# Patient Record
Sex: Male | Born: 1945 | Race: Black or African American | Hispanic: No | Smoking: Current every day smoker
Health system: Southern US, Community
[De-identification: ages and names within clinical notes are randomized; demographics above are authoritative.]

## PROBLEM LIST (undated history)

## (undated) DIAGNOSIS — I10 Essential (primary) hypertension: Secondary | ICD-10-CM

## (undated) DIAGNOSIS — F09 Unspecified mental disorder due to known physiological condition: Secondary | ICD-10-CM

## (undated) DIAGNOSIS — Z9889 Other specified postprocedural states: Secondary | ICD-10-CM

## (undated) DIAGNOSIS — S065X9A Traumatic subdural hemorrhage with loss of consciousness of unspecified duration, initial encounter: Secondary | ICD-10-CM

## (undated) DIAGNOSIS — G8191 Hemiplegia, unspecified affecting right dominant side: Secondary | ICD-10-CM

## (undated) DIAGNOSIS — R269 Unspecified abnormalities of gait and mobility: Secondary | ICD-10-CM

## (undated) DIAGNOSIS — J189 Pneumonia, unspecified organism: Secondary | ICD-10-CM

## (undated) DIAGNOSIS — I639 Cerebral infarction, unspecified: Secondary | ICD-10-CM

## (undated) DIAGNOSIS — T420X1A Poisoning by hydantoin derivatives, accidental (unintentional), initial encounter: Secondary | ICD-10-CM

## (undated) DIAGNOSIS — S065XAA Traumatic subdural hemorrhage with loss of consciousness status unknown, initial encounter: Secondary | ICD-10-CM

## (undated) DIAGNOSIS — F1910 Other psychoactive substance abuse, uncomplicated: Secondary | ICD-10-CM

## (undated) DIAGNOSIS — R569 Unspecified convulsions: Secondary | ICD-10-CM

## (undated) HISTORY — PX: CRANIOTOMY: SHX93

## (undated) HISTORY — DX: Unspecified abnormalities of gait and mobility: R26.9

---

## 1997-11-05 ENCOUNTER — Other Ambulatory Visit: Admission: RE | Admit: 1997-11-05 | Discharge: 1997-11-05 | Payer: Self-pay

## 1997-12-24 ENCOUNTER — Other Ambulatory Visit: Admission: RE | Admit: 1997-12-24 | Discharge: 1997-12-24 | Payer: Self-pay

## 1998-01-19 ENCOUNTER — Other Ambulatory Visit: Admission: RE | Admit: 1998-01-19 | Discharge: 1998-01-19 | Payer: Self-pay

## 1998-02-14 ENCOUNTER — Other Ambulatory Visit: Admission: RE | Admit: 1998-02-14 | Discharge: 1998-02-14 | Payer: Self-pay

## 1998-02-18 ENCOUNTER — Inpatient Hospital Stay (HOSPITAL_COMMUNITY): Admission: EM | Admit: 1998-02-18 | Discharge: 1998-02-21 | Payer: Self-pay | Admitting: Emergency Medicine

## 1998-06-27 ENCOUNTER — Encounter: Payer: Self-pay | Admitting: Emergency Medicine

## 1998-06-28 ENCOUNTER — Encounter: Payer: Self-pay | Admitting: Internal Medicine

## 1998-06-28 ENCOUNTER — Inpatient Hospital Stay (HOSPITAL_COMMUNITY): Admission: EM | Admit: 1998-06-28 | Discharge: 1998-06-29 | Payer: Self-pay | Admitting: Emergency Medicine

## 1998-08-26 ENCOUNTER — Encounter: Payer: Self-pay | Admitting: Emergency Medicine

## 1998-08-27 ENCOUNTER — Inpatient Hospital Stay (HOSPITAL_COMMUNITY): Admission: EM | Admit: 1998-08-27 | Discharge: 1998-09-01 | Payer: Self-pay | Admitting: Emergency Medicine

## 1998-08-28 ENCOUNTER — Encounter (HOSPITAL_BASED_OUTPATIENT_CLINIC_OR_DEPARTMENT_OTHER): Payer: Self-pay | Admitting: Internal Medicine

## 1998-10-02 ENCOUNTER — Encounter: Payer: Self-pay | Admitting: Emergency Medicine

## 1998-10-02 ENCOUNTER — Emergency Department (HOSPITAL_COMMUNITY): Admission: EM | Admit: 1998-10-02 | Discharge: 1998-10-02 | Payer: Self-pay | Admitting: Emergency Medicine

## 1998-10-28 ENCOUNTER — Encounter: Payer: Self-pay | Admitting: Internal Medicine

## 1998-10-28 ENCOUNTER — Inpatient Hospital Stay (HOSPITAL_COMMUNITY): Admission: EM | Admit: 1998-10-28 | Discharge: 1998-10-31 | Payer: Self-pay | Admitting: Emergency Medicine

## 1998-10-28 ENCOUNTER — Encounter: Payer: Self-pay | Admitting: Emergency Medicine

## 1998-12-06 ENCOUNTER — Encounter: Payer: Self-pay | Admitting: Emergency Medicine

## 1998-12-06 ENCOUNTER — Inpatient Hospital Stay (HOSPITAL_COMMUNITY): Admission: EM | Admit: 1998-12-06 | Discharge: 1998-12-09 | Payer: Self-pay | Admitting: Emergency Medicine

## 1999-03-09 ENCOUNTER — Emergency Department (HOSPITAL_COMMUNITY): Admission: EM | Admit: 1999-03-09 | Discharge: 1999-03-09 | Payer: Self-pay | Admitting: Emergency Medicine

## 1999-03-10 ENCOUNTER — Encounter: Payer: Self-pay | Admitting: Emergency Medicine

## 2000-01-20 ENCOUNTER — Emergency Department (HOSPITAL_COMMUNITY): Admission: EM | Admit: 2000-01-20 | Discharge: 2000-01-20 | Payer: Self-pay | Admitting: Emergency Medicine

## 2000-01-20 ENCOUNTER — Encounter: Payer: Self-pay | Admitting: Emergency Medicine

## 2000-01-29 ENCOUNTER — Encounter: Payer: Self-pay | Admitting: Emergency Medicine

## 2000-01-29 ENCOUNTER — Inpatient Hospital Stay (HOSPITAL_COMMUNITY): Admission: EM | Admit: 2000-01-29 | Discharge: 2000-02-08 | Payer: Self-pay | Admitting: Emergency Medicine

## 2000-01-30 ENCOUNTER — Encounter: Payer: Self-pay | Admitting: Family Medicine

## 2000-02-01 ENCOUNTER — Encounter: Payer: Self-pay | Admitting: Family Medicine

## 2000-02-03 ENCOUNTER — Encounter: Payer: Self-pay | Admitting: Family Medicine

## 2000-02-04 ENCOUNTER — Encounter: Payer: Self-pay | Admitting: Family Medicine

## 2000-02-09 ENCOUNTER — Emergency Department (HOSPITAL_COMMUNITY): Admission: EM | Admit: 2000-02-09 | Discharge: 2000-02-09 | Payer: Self-pay | Admitting: Emergency Medicine

## 2000-02-22 ENCOUNTER — Encounter: Admission: RE | Admit: 2000-02-22 | Discharge: 2000-02-22 | Payer: Self-pay | Admitting: Family Medicine

## 2000-05-26 ENCOUNTER — Emergency Department (HOSPITAL_COMMUNITY): Admission: EM | Admit: 2000-05-26 | Discharge: 2000-05-27 | Payer: Self-pay | Admitting: Emergency Medicine

## 2000-06-05 ENCOUNTER — Emergency Department (HOSPITAL_COMMUNITY): Admission: EM | Admit: 2000-06-05 | Discharge: 2000-06-05 | Payer: Self-pay | Admitting: Emergency Medicine

## 2000-06-24 ENCOUNTER — Emergency Department (HOSPITAL_COMMUNITY): Admission: EM | Admit: 2000-06-24 | Discharge: 2000-06-24 | Payer: Self-pay | Admitting: Emergency Medicine

## 2000-08-17 ENCOUNTER — Inpatient Hospital Stay (HOSPITAL_COMMUNITY): Admission: EM | Admit: 2000-08-17 | Discharge: 2000-08-19 | Payer: Self-pay | Admitting: *Deleted

## 2000-11-26 ENCOUNTER — Emergency Department (HOSPITAL_COMMUNITY): Admission: EM | Admit: 2000-11-26 | Discharge: 2000-11-26 | Payer: Self-pay | Admitting: Emergency Medicine

## 2000-12-22 ENCOUNTER — Emergency Department (HOSPITAL_COMMUNITY): Admission: EM | Admit: 2000-12-22 | Discharge: 2000-12-22 | Payer: Self-pay | Admitting: Emergency Medicine

## 2000-12-22 ENCOUNTER — Encounter: Payer: Self-pay | Admitting: Emergency Medicine

## 2001-03-12 ENCOUNTER — Emergency Department (HOSPITAL_COMMUNITY): Admission: EM | Admit: 2001-03-12 | Discharge: 2001-03-13 | Payer: Self-pay

## 2001-06-02 ENCOUNTER — Inpatient Hospital Stay (HOSPITAL_COMMUNITY): Admission: EM | Admit: 2001-06-02 | Discharge: 2001-06-04 | Payer: Self-pay | Admitting: Emergency Medicine

## 2001-06-02 ENCOUNTER — Encounter: Payer: Self-pay | Admitting: Emergency Medicine

## 2001-11-06 ENCOUNTER — Emergency Department (HOSPITAL_COMMUNITY): Admission: EM | Admit: 2001-11-06 | Discharge: 2001-11-06 | Payer: Self-pay | Admitting: Emergency Medicine

## 2003-02-08 ENCOUNTER — Emergency Department (HOSPITAL_COMMUNITY): Admission: EM | Admit: 2003-02-08 | Discharge: 2003-02-09 | Payer: Self-pay | Admitting: *Deleted

## 2003-09-21 ENCOUNTER — Emergency Department (HOSPITAL_COMMUNITY): Admission: EM | Admit: 2003-09-21 | Discharge: 2003-09-21 | Payer: Self-pay | Admitting: Emergency Medicine

## 2003-12-21 ENCOUNTER — Emergency Department (HOSPITAL_COMMUNITY): Admission: EM | Admit: 2003-12-21 | Discharge: 2003-12-21 | Payer: Self-pay

## 2004-02-02 ENCOUNTER — Emergency Department (HOSPITAL_COMMUNITY): Admission: EM | Admit: 2004-02-02 | Discharge: 2004-02-03 | Payer: Self-pay

## 2004-02-19 ENCOUNTER — Emergency Department (HOSPITAL_COMMUNITY): Admission: EM | Admit: 2004-02-19 | Discharge: 2004-02-19 | Payer: Self-pay

## 2004-04-24 ENCOUNTER — Emergency Department (HOSPITAL_COMMUNITY): Admission: EM | Admit: 2004-04-24 | Discharge: 2004-04-24 | Payer: Self-pay | Admitting: Emergency Medicine

## 2004-05-20 ENCOUNTER — Emergency Department (HOSPITAL_COMMUNITY): Admission: EM | Admit: 2004-05-20 | Discharge: 2004-05-21 | Payer: Self-pay | Admitting: Emergency Medicine

## 2004-06-20 ENCOUNTER — Emergency Department (HOSPITAL_COMMUNITY): Admission: EM | Admit: 2004-06-20 | Discharge: 2004-06-20 | Payer: Self-pay | Admitting: Emergency Medicine

## 2004-07-25 ENCOUNTER — Emergency Department (HOSPITAL_COMMUNITY): Admission: EM | Admit: 2004-07-25 | Discharge: 2004-07-25 | Payer: Self-pay | Admitting: Emergency Medicine

## 2004-08-30 ENCOUNTER — Inpatient Hospital Stay (HOSPITAL_COMMUNITY): Admission: EM | Admit: 2004-08-30 | Discharge: 2004-09-01 | Payer: Self-pay | Admitting: Emergency Medicine

## 2004-09-10 ENCOUNTER — Inpatient Hospital Stay (HOSPITAL_COMMUNITY): Admission: EM | Admit: 2004-09-10 | Discharge: 2004-09-14 | Payer: Self-pay | Admitting: Emergency Medicine

## 2005-12-15 ENCOUNTER — Emergency Department (HOSPITAL_COMMUNITY): Admission: EM | Admit: 2005-12-15 | Discharge: 2005-12-15 | Payer: Self-pay | Admitting: Emergency Medicine

## 2005-12-24 ENCOUNTER — Emergency Department (HOSPITAL_COMMUNITY): Admission: EM | Admit: 2005-12-24 | Discharge: 2005-12-24 | Payer: Self-pay | Admitting: Emergency Medicine

## 2006-08-01 ENCOUNTER — Emergency Department (HOSPITAL_COMMUNITY): Admission: EM | Admit: 2006-08-01 | Discharge: 2006-08-01 | Payer: Self-pay | Admitting: Emergency Medicine

## 2008-09-11 ENCOUNTER — Emergency Department (HOSPITAL_COMMUNITY): Admission: EM | Admit: 2008-09-11 | Discharge: 2008-09-11 | Payer: Self-pay | Admitting: Emergency Medicine

## 2008-10-27 ENCOUNTER — Ambulatory Visit (HOSPITAL_COMMUNITY): Admission: RE | Admit: 2008-10-27 | Discharge: 2008-10-27 | Payer: Self-pay | Admitting: Unknown Physician Specialty

## 2009-02-11 ENCOUNTER — Encounter: Admission: RE | Admit: 2009-02-11 | Discharge: 2009-02-11 | Payer: Self-pay | Admitting: Neurology

## 2009-04-26 ENCOUNTER — Emergency Department (HOSPITAL_COMMUNITY): Admission: EM | Admit: 2009-04-26 | Discharge: 2009-04-26 | Payer: Self-pay | Admitting: Emergency Medicine

## 2010-06-04 ENCOUNTER — Emergency Department (HOSPITAL_COMMUNITY): Admission: EM | Admit: 2010-06-04 | Discharge: 2010-06-05 | Payer: Self-pay | Admitting: Emergency Medicine

## 2010-09-17 ENCOUNTER — Emergency Department (HOSPITAL_COMMUNITY)
Admission: EM | Admit: 2010-09-17 | Discharge: 2010-09-18 | Disposition: A | Discharge status: Home / Self Care | Payer: Medicare Other | Attending: Emergency Medicine | Admitting: Emergency Medicine

## 2010-09-17 ENCOUNTER — Emergency Department (HOSPITAL_COMMUNITY): Payer: Medicare Other

## 2010-09-17 DIAGNOSIS — G40909 Epilepsy, unspecified, not intractable, without status epilepticus: Secondary | ICD-10-CM | POA: Insufficient documentation

## 2010-09-17 DIAGNOSIS — W050XXA Fall from non-moving wheelchair, initial encounter: Secondary | ICD-10-CM | POA: Insufficient documentation

## 2010-09-17 DIAGNOSIS — R51 Headache: Secondary | ICD-10-CM | POA: Insufficient documentation

## 2010-09-17 DIAGNOSIS — S0180XA Unspecified open wound of other part of head, initial encounter: Secondary | ICD-10-CM | POA: Insufficient documentation

## 2010-09-17 DIAGNOSIS — I10 Essential (primary) hypertension: Secondary | ICD-10-CM | POA: Insufficient documentation

## 2010-09-17 DIAGNOSIS — Z8673 Personal history of transient ischemic attack (TIA), and cerebral infarction without residual deficits: Secondary | ICD-10-CM | POA: Insufficient documentation

## 2010-09-17 DIAGNOSIS — Z79899 Other long term (current) drug therapy: Secondary | ICD-10-CM | POA: Insufficient documentation

## 2010-09-17 DIAGNOSIS — S0083XA Contusion of other part of head, initial encounter: Secondary | ICD-10-CM | POA: Insufficient documentation

## 2010-09-17 DIAGNOSIS — Y929 Unspecified place or not applicable: Secondary | ICD-10-CM | POA: Insufficient documentation

## 2010-09-17 DIAGNOSIS — S0990XA Unspecified injury of head, initial encounter: Secondary | ICD-10-CM | POA: Insufficient documentation

## 2010-09-17 DIAGNOSIS — S0003XA Contusion of scalp, initial encounter: Secondary | ICD-10-CM | POA: Insufficient documentation
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
## 2010-12-22 NOTE — Discharge Summary (Signed)
[place]. [facility]  Patient:    [name], [name]                  MRN: [id number] Adm. Date:  [id number] Disc. Date: 02/07/00 Attending:  Willow Ora Dictator:   Pearla Dubonnet, [name] CC:         [facility] - [place], [place]                           Discharge Summary  DISCHARGE DIAGNOSES:  1. Fever of unknown origin.  2. Seizure disorder, secondary to a [facility] and alcohol,     with a right frontoparietal hematoma, status post craniotomy, with residual     right hemiparesis.  3. Hyponatremia, resolved with hydration.  4. Chronic microcytic anemia.  5. History of alcohol, cocaine, and marijuana abuse.  6. Old rib fractures.  7. History of hypertension.  8. History of gastritis.  9. History of noncompliance with medicines and multiple admissions to     hospitals for seizures, secondary to subtherapeutic medication levels. 10. Gastritis by esophagogastroduodenoscopy, and hemorrhoids by colonoscopy     with Dr. Anselmo Rod in 1998. 11. Cigarette use.  CONSULTATION: 1. Infectious disease, Dr. Dewayne Shorter. 2. Neurology,  Dr. Candy Sledge or Dr. Genene Churn. Love, (Cannot make    out the name).  PROCEDURE: 1. PIC line insertion on February 01, 2000. 2. HIV test which was negative on January 30, 2000. 3. Lumbar puncture on January 30, 2000, which revealed no white blood cells    or organisms, protein 17, glucose 84, and a differential with 2 white    blood cells, lymphs, and 2 red blood cells. 4. Head CT on January 30, 2000, which revealed no acute intracranial abnormalities. 5. Abdominal ultrasound on January 20, 2000, which was normal. 6. An electroencephalogram performed on February 03, 2000, which revealed    generalized slowing, no seizure events, waxing and waning mental status.  HISTORY OF PRESENT ILLNESS:  Mr. Paulding is a 65 year old black male with a history of seizure disorder and right hemiparesis, who has been  admitted several times to the hospital for seizures, secondary to subtherapeutic medication levels, who was admitted on January 29, 2000, with a reported two-day history of fever to 02 degrees, by the nursing staff at [facility], along with a decreased level of consciousness.  The patient at the time of admission had a decreased level of consciousness and was unable to answer any questions.  He reportedly has had no  seizures since January 20, 2000.  PHYSICAL EXAMINATION:  VITAL SIGNS:  On admission temperature 104.6 degrees rectally, heart rate 119, blood pressure 140/94.  GENERAL:  He was lethargic at the time of examination, responded to voice with  grunt, but would not answer questions and was very ill-appearing.  NEUROLOGIC:  He would not follow commands and moved only his left side, but does have a known right hemiparesis.  LABORATORY DATA:  On admission revealed a sodium of 128, Dilantin level 17.2, valproic acid level of 74.8.  A chest x-ray revealed a possible small right lower lobe infiltrate.  HOSPITAL COURSE:  The patient was admitted to the step-down unit due to his fever over greater than 24 hours, and decreased level of consciousness.  #1 - FEVER:  Upon admission to the unit an extensive workup began, trying to locate the source of his fever.  This workup included  chest x-ray to rule out pneumonia, which was noted to be possibly a very small right lower lobe infiltrate.  Also  urinalysis to rule out urinary tract infection.  A lumbar puncture to rule out meningitis.  Blood cultures were obtained and an HIV test was performed.  The results of all these tests did not reveal an obvious source for his high fevers, but it was decided to go ahead and start IV Zosyn for coverage of occult infectious source.  Given the fact of his persistent high fevers, with no obvious source, n January 31, 2000, an infectious disease consultation was obtained with Dr. Cliffton Asters,  who recommended continuing the Zosyn, but also adding Tequin 400 mg IV q.d. as well as sending a Legionella urinary antigen.  It was felt that the source of the fevers may not be infectious in etiology, but rather due to a central process.   Due to unlikely infectious etiology, it was decided to discontinue all sedative medications and antibiotics on February 03, 2000, after which time the patient remained febrile until February 04, 2000, at which time he became afebrile and remained afebrile throughout the rest of the course of this hospital stay.  On February 07, 2000, on the date of discharge, his temperature was 97.8 degrees.  It is still not clear the source of his fever.  #2 - DECREASED LEVEL OF CONSCIOUSNESS:  On admission the patient was obtunded and would only respond to pain and voice with a grunt.  He was not following commands. This was thought to be secondary to his high fever.  His mental status continued to wax and wane, until February 04, 2000, when he became alert and more responsive, in concurrence with the resolution of his fever and his continuance of the Ativan,  which was given prophylactically for delirium tremens prophylaxis.  He remained  alert and oriented throughout the rest of his hospital stay.  He was able to have his feeding tube removed on February 05, 2000.  A head CT was performed on January 30, 2000, to rule out any intracranial pathology as the source of his altered mental status, as well as an ammonium level was drawn which was initially high at 66 on February 04, 2000, but at the time of discharge his ammonium level has fallen to 50.   #3 - SEIZURE DISORDER:  The patient remained seizure-free during the course of is hospital stay.  He was continued on his home dose of Dilantin and Depakote. Levels drawn on February 02, 2000, revealed a Dilantin level of 10.2, valproic acid level f 33.6.  It was neurologys recommendation that the current dosing schedule  was subtherapeutic, so it was recommended to increase the Depakote dosing to 1500 mg in the morning, 500 mg at noon, and 1500 mg at night, for five days, and then increase  to 1500 mg in the morning, 1000 mg at lunch, and 1500 mg in the evening, and follow drug levels, as well as ammonium levels closely.  #4 - HYPONATREMIA:  On admission the patients sodium level was 128.  This was felt to be reflective of decreased p.o. intake and dehydration.  It did spontaneously resolve after hydration, and on the day of discharge the sodium was 142.  #5 - MOBILITY:  At the time of admission the patient was obtunded and unable to  perform any activities by himself, but over the course of his hospital stay, he  continued to improve.  On February 06, 2000, was  able to work with physical therapy n transferring from bed to chair, and able to feed himself, etc.  Physical therapy felt that the patient was not far from baseline at the time of discharge.  CONDITION ON DISCHARGE:  Stable.  DISPOSITION:  The patient will be discharged back to [facility].  DISCHARGE MEDICATIONS: 1. Depakote 1500 mg p.o. q.a.m., 500 mg p.o. q. 1200 hours, 1500 mg p.o.    q.h.s. x 4 days, then change to the following scheduled:  1500 mg p.o.    q.a.m., 1000 mg p.o. q. 1200 hours, and 1500 mg p.o. q.h.s. 2. Thiamine 100 mg p.o. q.d. 3. Folic acid 1 mg p.o. q.d. 4. Multivitamin liquid 5 ml p.o. q.d.  ACTIVITIES:  The patient is to resume the same activity prior to admission.  DIET:  He can have a regular diet with Ensure 240 ml p.o. t.i.d. supplements.  FOLLOWUP:  The patient needs followup at [facility] for both the valproic acid levels, as well as ammonium level.  NOTATION:  The patient is a no-code blue. DD:  02/07/00 TD:  02/07/00 Job: [id number]

## 2010-12-22 NOTE — Procedures (Signed)
CLINICAL HISTORY:  A 65 year old male with known history of seizures has had  a breakthrough seizure.  Medication listed Dilantin and Keppra.   This is a 17-channel EEG recorded during wakeful states and sleep using  standard 10/20 electrode placement.   Background awake rhythm consists of 7-8 Hz __________ and mixed with slow  theta activity.  Five to six Hertz theta slowing is seen bilaterally in  diffuse and symmetric distribution.  Intermittent left central and temporal  sharp waves are seen.  No paroxysmal epileptiform activity seen.  Sleep  stages are achieved naturally and show normal physiological findings.  Length of the tracing is 21.2 minutes.  Technical component is adequate.  EKG tracing reveals regular sinus rhythm.  Hyperventilation and photic  stimulation were not performed.   IMPRESSION:  This EEG performed during wakeful states and sleep is abnormal  due to presence of mild generalized slowing and focal left-sided cortical  irritability.  No definite epileptiform activity however, seen.      ZOX:WRUE  D:  09/11/2004 18:59:15  T:  09/11/2004 20:01:25  Job #:  [id number]
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
## 2010-12-22 NOTE — Discharge Summary (Signed)
[facility]  Patient:    [name], [name] Visit Number: [id number] MRN: [id number]          Service Type: MED Location: [id number] Attending Physician:  [name] Dictated by:   [name], [name] Admit Date:  06/02/2001 Discharge Date: 06/04/2001   CC:         [name] Deputy, [name], [facility]                           Discharge Summary  DATE OF BIRTH:  October 19, 1959  CONSULTATIONS:  None.  PROCEDURES:  None.  DISCHARGE DIAGNOSES:  1. Generalized seizures.  2. History of seizure disorder, onset age 65, with history of frequent prior     admissions.  3. History of head trauma in 1993 resulting in right frontoparietal hematoma,     status post evacuation.  4. Staphylococcus coagulase-negative bacteriemia, etiology unclear,     asymptomatic this admission.  5. Organic brain syndrome, multifactorial and chronic.  6. History of polysubstance abuse (crack cocaine, alcohol, marijuana, none     recently).  7. History of microcytic anemia, hemoglobin baseline 10, MCV 74, stable since     January 2002.  8. History of gastritis by esophagogastroduodenoscopy in 1998.  9. History of post-seizure fever with negative extensive evaluation on prior     evaluations. 10. History of hypertension.  DISCHARGE MEDICATIONS:  1. Dilantin 100 mg p.o. q.a.m., 300 mg p.o. q.h.s. (representing addition of     morning dose).  2. Depakote 500 mg p.o. t.i.d.  3. Pepcid 20 mg p.o. b.i.d.  4. Tequin 400 mg p.o. q.d. x 1 week.  Patient may discontinue folate and thiamine, which he was taking prior to admission.  DISCHARGE FOLLOWUP:  Patient has been followed by Dr. Archie [name] of [facility] out of [place], who provides P.A. in the [facility] setting every Wednesday.  I have arranged for a new-patient evaluation with Dr. Tresa Endo L. [name] at [facility], at which time Dilantin level will be drawn.  I believe [name] needs  increased attention to his seizure disorder.  SUMMARY:  Patient is a 65 year old resident of [facility] with the above history, who was witnessed by staff to have a total of four focal seizures characterized by grimacing of the face, clinching of the jaw and biting of the tongue, typical of his previous seizure activity.  He was transported thereafter to [facility] for evaluation, where he was postictal and nonresponsive.  History was obtained from prior medical records.  For further details or presentation, please see report by Dr. Jetty [name], admitting physician on unassigned call.  HOSPITAL COURSE: #1 - SEIZURE DISORDER:  Mr. [name] was noted to have subtherapeutic Dilantin level of 5.9 on his regimen of Dilantin 300 mg p.o. q.h.s.  Valproic acid was therapeutic at 99.2.  Following admission, [name] returned to his baseline mental status, at which time he could describe the expected diffuse myalgias and right temporal headache he often experiences following a headache.  He described no other symptoms, displayed normal eating and self-care behavior, and was deemed stable for discharge.  Medication regimen was altered with the addition of a morning dose of Dilantin following an IV load at initial presentation.  Would recommend maintaining him at therapeutic range, given his propensity for seizure frequency when this is not the case.  Note:  CT done at admission showed no intracranial disease.  Postoperative changes  at the right frontoparietal region were noted as previous.  #2 - STAPH COAGULASE-NEGATIVE BACTERIEMIA:  Mr. [name] was noted to be febrile on admission, which is often the case when he presents following a seizure.  Despite absence of antimicrobial therapy, the fever resolved.  There were no symptoms suggesting sore throat or fever, but blood cultures were curiously both positive for Staph species coagulase-negative.  Because it is unusual both samples  should be contaminated, he will be treated empirically with a week of Tequin, despite my inability to document an underlying cause. Because he is asymptomatic, I feel comfortable discharging him on an oral regimen.  There have been no symptoms of sepsis.  Physical exam was unremarkable. Dictated by:   [name], [name] Attending Physician:  [name] DD:  06/04/01 TD:  06/04/01 Job: [id number]

## 2010-12-22 NOTE — H&P (Signed)
Peter, [name] NO.:  [id number]   MEDICAL RECORD NO.:  [id number]          PATIENT TYPE:  INP   LOCATION:  3110                         FACILITY:  [facility]   PHYSICIAN:  Loyola Mast, MD       DATE OF BIRTH:  07-15-1946   DATE OF ADMISSION:  09/09/2004  DATE OF DISCHARGE:                                HISTORY & PHYSICAL   CHIEF COMPLAINT:  Seizure.   He was apparently at his nursing home when the staff noticed that the  patient was on the ground having a seizure lasting approximately two  minutes. The patient was brought to the emergency room for further  evaluation en route and here in the emergency room the patient had no  further evidence of seizures. The patient is postictal and not able to  supply a history.   The patient has a known history of seizure disorder extending back to at  least the early 1990s. At this point the patient's only complaint is a  headache for which he says he has had for nearly two years. He cannot  remember anything of today's events nor for the past several days.   PAST MEDICAL HISTORY:  1.  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  2.  Organic brain syndrome secondary to head trauma; it is unclear whether      it is 1993 or 1983.  3.  Polysubstance abuse, crack cocaine and alcohol.  4.  Seizure disorder secondary to #1 and #2.  5.  History of hypertension.  6.  Gastritis.  7.  Hemorrhoids.  8.  Microcytic anemia.  9.  History of gastritis.   PAST SURGICAL HISTORY:  Status post craniotomy for a parietal hematoma.   ALLERGIES:  DEPAKOTE.   MEDICATIONS ON DISCHARGE:  1.  Iron sulfate.  2.  Triamcinolone cream.  3.  Clonidine.  4.  Restoril.  5.  Lorazepam.  6.  Vicodin.  7.  Dilantin.  8.  Toprol.   SOCIAL HISTORY:  The patient currently lives at a local nursing home, [facility]. He has a history of alcohol abuse, currently none. He  lives on social security  disability.   FAMILY HISTORY:  His sister had a seizure disorder at the age of 65.  Otherwise, the patient cannot state any family history.   REVIEW OF SYSTEMS:  Unable to elicit.   PHYSICAL EXAMINATION:  VITAL SIGNS: Temperature 98.4, blood pressure ranges  136 to 154 systolic, diastolic 82 to 94. Heart rate 84 to 99, respiratory  rate 18 to 20. He is saturating 95% on room air.  GENERAL: He is an elderly [place] American male who appears older than his  stated age.  HEENT: The patient has evidence of cranial trauma with several stitches in  the right frontal scalp area. His pupils are equal, round, and reactive to  light approximately 3 mm. There is no evidence of thrush. The oropharynx is  without evidence of laceration.  LUNGS: Clear.  ABDOMEN: Soft, scaphoid. Bowel sounds present without organomegaly.  CARDIOVASCULAR: S1 and S2 normal. There are  no clicks, rubs, or gallops.  EXTREMITIES: Warm, moist, perfuse, without edema.  NEUROLOGIC: He follows commands appropriately. Cranial nerves II-XII are  intact. His right side shows decreased muscular strength, 4/5 compared to  5/5 on the left.   LABORATORY DATA:  Dilantin 16.3, sodium 138, potassium 4.2, chloride 106,  bicarbonate 26, BUN 10, creatinine 0.7, glucose 94. Hemoglobin 41,  hematocrit 13.9.   PROBLEM LIST:  Seizure disorder. Neurologic consultation was obtained in the  ER from the ER physician. Keppra was recommended at 250 mg b.i.d. in  addition to his Dilantin. A formal neurologic consultation may be considered  in the morning. This patient's neurologic exam has not changed significantly  since discharge and repeat CT scan is not currently warranted. However, his  neurological status will be monitored closely for any changes. Intracranial  imaging may be indicated at that point.      JMJ/MEDQ  D:  09/10/2004  T:  09/10/2004  Job:  [id number]

## 2011-04-04 ENCOUNTER — Emergency Department (HOSPITAL_COMMUNITY)
Admission: EM | Admit: 2011-04-04 | Discharge: 2011-04-04 | Disposition: A | Discharge status: Home / Self Care | Payer: Medicare Other | Attending: Emergency Medicine | Admitting: Emergency Medicine

## 2011-04-04 ENCOUNTER — Encounter (HOSPITAL_COMMUNITY): Payer: Self-pay | Admitting: Radiology

## 2011-04-04 ENCOUNTER — Emergency Department (HOSPITAL_COMMUNITY): Payer: Medicare Other

## 2011-04-04 DIAGNOSIS — G44209 Tension-type headache, unspecified, not intractable: Secondary | ICD-10-CM | POA: Insufficient documentation

## 2011-04-04 DIAGNOSIS — I10 Essential (primary) hypertension: Secondary | ICD-10-CM | POA: Insufficient documentation

## 2011-04-04 DIAGNOSIS — K921 Melena: Secondary | ICD-10-CM | POA: Insufficient documentation

## 2011-04-04 DIAGNOSIS — M542 Cervicalgia: Secondary | ICD-10-CM | POA: Insufficient documentation

## 2011-04-04 DIAGNOSIS — R630 Anorexia: Secondary | ICD-10-CM | POA: Insufficient documentation

## 2011-04-04 DIAGNOSIS — R143 Flatulence: Secondary | ICD-10-CM | POA: Insufficient documentation

## 2011-04-04 DIAGNOSIS — Z79899 Other long term (current) drug therapy: Secondary | ICD-10-CM | POA: Insufficient documentation

## 2011-04-04 DIAGNOSIS — G40909 Epilepsy, unspecified, not intractable, without status epilepticus: Secondary | ICD-10-CM | POA: Insufficient documentation

## 2011-04-04 DIAGNOSIS — R141 Gas pain: Secondary | ICD-10-CM | POA: Insufficient documentation

## 2011-04-04 DIAGNOSIS — G319 Degenerative disease of nervous system, unspecified: Secondary | ICD-10-CM | POA: Insufficient documentation

## 2011-04-04 DIAGNOSIS — R142 Eructation: Secondary | ICD-10-CM | POA: Insufficient documentation

## 2011-04-04 DIAGNOSIS — R110 Nausea: Secondary | ICD-10-CM | POA: Insufficient documentation

## 2011-04-04 DIAGNOSIS — H53149 Visual discomfort, unspecified: Secondary | ICD-10-CM | POA: Insufficient documentation

## 2011-04-04 DIAGNOSIS — Z8673 Personal history of transient ischemic attack (TIA), and cerebral infarction without residual deficits: Secondary | ICD-10-CM | POA: Insufficient documentation

## 2011-04-04 DIAGNOSIS — Z7982 Long term (current) use of aspirin: Secondary | ICD-10-CM | POA: Insufficient documentation

## 2011-04-04 HISTORY — DX: Essential (primary) hypertension: I10

## 2011-04-04 HISTORY — DX: Cerebral infarction, unspecified: I63.9

## 2011-04-04 HISTORY — DX: Other specified postprocedural states: Z98.890

## 2011-04-04 HISTORY — DX: Traumatic subdural hemorrhage with loss of consciousness status unknown, initial encounter: S06.5XAA

## 2011-04-04 HISTORY — DX: Traumatic subdural hemorrhage with loss of consciousness of unspecified duration, initial encounter: S06.5X9A

## 2011-04-04 LAB — BASIC METABOLIC PANEL
BUN: 13 mg/dL (ref 6–23)
CO2: 26 mEq/L (ref 19–32)
Calcium: 9.2 mg/dL (ref 8.4–10.5)
Creatinine, Ser: 0.76 mg/dL (ref 0.50–1.35)
GFR calc Af Amer: >60 mL/min (ref >60)
GFR calc non Af Amer: >60 mL/min (ref >60)
Glucose, Bld: 85 mg/dL (ref 70–99)
Potassium: 4.2 mEq/L (ref 3.5–5.1)
Sodium: 137 mEq/L (ref 135–145)

## 2011-04-04 LAB — PHENYTOIN LEVEL, TOTAL: Phenytoin Lvl: 13.7 ug/mL (ref 10.0–20.0)

## 2011-12-18 ENCOUNTER — Encounter (HOSPITAL_COMMUNITY): Payer: Self-pay | Admitting: *Deleted

## 2011-12-18 ENCOUNTER — Emergency Department (HOSPITAL_COMMUNITY)
Admission: EM | Admit: 2011-12-18 | Discharge: 2011-12-18 | Disposition: A | Discharge status: Skilled Nursing Facility | Payer: Medicare Other | Attending: Emergency Medicine | Admitting: Emergency Medicine

## 2011-12-18 DIAGNOSIS — W503XXA Accidental bite by another person, initial encounter: Secondary | ICD-10-CM | POA: Insufficient documentation

## 2011-12-18 DIAGNOSIS — R569 Unspecified convulsions: Secondary | ICD-10-CM

## 2011-12-18 DIAGNOSIS — R51 Headache: Secondary | ICD-10-CM | POA: Insufficient documentation

## 2011-12-18 DIAGNOSIS — Z8673 Personal history of transient ischemic attack (TIA), and cerebral infarction without residual deficits: Secondary | ICD-10-CM | POA: Insufficient documentation

## 2011-12-18 DIAGNOSIS — F29 Unspecified psychosis not due to a substance or known physiological condition: Secondary | ICD-10-CM | POA: Insufficient documentation

## 2011-12-18 DIAGNOSIS — R32 Unspecified urinary incontinence: Secondary | ICD-10-CM | POA: Insufficient documentation

## 2011-12-18 DIAGNOSIS — R29898 Other symptoms and signs involving the musculoskeletal system: Secondary | ICD-10-CM | POA: Insufficient documentation

## 2011-12-18 DIAGNOSIS — Z79899 Other long term (current) drug therapy: Secondary | ICD-10-CM | POA: Insufficient documentation

## 2011-12-18 DIAGNOSIS — Z7982 Long term (current) use of aspirin: Secondary | ICD-10-CM | POA: Insufficient documentation

## 2011-12-18 DIAGNOSIS — IMO0002 Reserved for concepts with insufficient information to code with codable children: Secondary | ICD-10-CM | POA: Insufficient documentation

## 2011-12-18 DIAGNOSIS — G40909 Epilepsy, unspecified, not intractable, without status epilepticus: Secondary | ICD-10-CM | POA: Insufficient documentation

## 2011-12-18 DIAGNOSIS — I10 Essential (primary) hypertension: Secondary | ICD-10-CM | POA: Insufficient documentation

## 2011-12-18 HISTORY — DX: Unspecified mental disorder due to known physiological condition: F09

## 2011-12-18 HISTORY — DX: Unspecified convulsions: R56.9

## 2011-12-18 LAB — URINALYSIS, ROUTINE W REFLEX MICROSCOPIC
Leukocytes, UA: NEGATIVE
Nitrite: NEGATIVE
Protein, ur: NEGATIVE mg/dL
Specific Gravity, Urine: 1.016 (ref 1.005–1.030)
Urobilinogen, UA: 1 mg/dL (ref 0.0–1.0)

## 2011-12-18 LAB — PHENYTOIN LEVEL, TOTAL: Phenytoin Lvl: 11 ug/mL (ref 10.0–20.0)

## 2011-12-18 LAB — GLUCOSE, CAPILLARY: Glucose-Capillary: 106 mg/dL — ABNORMAL HIGH (ref 70–99)

## 2011-12-18 NOTE — ED Notes (Signed)
[facility] notified to transport pt back to [facility]. [facility] advised that they are enroute at this time.

## 2011-12-18 NOTE — ED Notes (Signed)
Per EMS, pt experienced a seizure sometime between last night and this AM; Not acting as normal per baseline. Pt is oriented to self, situation, history, but slightly disoriented to time. History of organic brain matter syndrome and seizures. Urinary incontinence; some oral trauma to tongue; dried blood on shirt. Wheelchair bound; uses a Naval architect wheelchair at [facility] (assisted living).

## 2011-12-18 NOTE — Discharge Instructions (Signed)
Seizure, Adult A seizure is when the body shakes uncontrollably (convulsion). It can be a scary experience. A seizure is not a diagnosis. It is a sign that something else may be wrong with brain and/or spinal cord (central nervous system). In the Emergency Department, your condition is evaluated. The seizure is then treated. You will likely need follow-up with your caregiver. You will possibly need further testing and evaluation. Your caregiver or the specialist to whom you are referred will determine if further treatment is needed. After a seizure, you may be confused, dazed and drowsy. These problems (symptoms) often follow a seizure. Medication given to treat the seizure may also cause some of these changes. The time following a seizure is known as a refractory period. Hospital admission is seldom required unless there are other conditions present such as trauma or metabolic problems. Sometimes the seizure activity follows a fainting episode. This may have been caused by a brief drop in blood pressure. These fainting (syncopal) seizures are generally not a cause for concern.  HOME CARE INSTRUCTIONS   Follow up with your caregiver as suggested.   If any problems happen, get help right away.   Do not swim or drive until your caregiver says it is okay.  Document Released: 07/20/2000 Document Revised: 07/12/2011 Document Reviewed: 07/11/2011 [employer] Patient Information 2012 [employer]. 

## 2011-12-18 NOTE — ED Notes (Signed)
RUE:[id number]<JW> Expected date:12/18/11<BR> Expected time: 8:13 AM<BR> Means of arrival:Ambulance<BR> Comments:<BR> seizure

## 2011-12-18 NOTE — ED Notes (Signed)
[facility] arrived for transport to [facility]. Received pt for transport. Discharge instructions provided to [facility] staff.

## 2011-12-18 NOTE — ED Notes (Signed)
Called [facility]; spoke to "Amy." Advised this person that [facility] will be transporting pt back to them soon.

## 2011-12-18 NOTE — ED Provider Notes (Signed)
History     CSN: [id number]  Arrival date & time 12/18/11  1191   First MD Initiated Contact with Patient 12/18/11 [phone]      Chief Complaint  Patient presents with  . Seizures    (Consider location/radiation/quality/duration/timing/severity/associated sxs/prior treatment) Patient is a 66 y.o. male presenting with seizures. The history is provided by the patient.  Seizures  This is a recurrent problem. Associated symptoms include headaches. Pertinent negatives include no chest pain, no nausea, no vomiting and no diarrhea.   patient states that he had a seizure last night. He states he woke up feeling as if he had one. He has a headache. He also had bit his tongue. He also is incontinent of urine. He is wheelchair-bound at baseline. He states he feels a little confused, which is normal for him after seizure. No change his medications. He states his been eating well recently. No fevers. No dysuria. No cough. No numbness or weakness. He has some chronic right-sided weakness.  Past Medical History  Diagnosis Date  . Hypertension   . SDH (subdural hematoma)   . CVA (cerebral infarction)   . History of craniotomy   . Seizures   . Organic brain syndrome     History reviewed. No pertinent past surgical history.  History reviewed. No pertinent family history.  History  Substance Use Topics  . Smoking status: Former Games developer  . Smokeless tobacco: Never Used  . Alcohol Use: No      Review of Systems  Constitutional: Negative for activity change and appetite change.  HENT: Negative for neck stiffness.   Eyes: Negative for pain.  Respiratory: Negative for chest tightness and shortness of breath.   Cardiovascular: Negative for chest pain and leg swelling.  Gastrointestinal: Negative for nausea, vomiting, abdominal pain and diarrhea.  Genitourinary: Negative for flank pain.  Musculoskeletal: Negative for back pain.  Skin: Negative for rash.  Neurological: Positive for seizures,  weakness and headaches. Negative for numbness.  Psychiatric/Behavioral: Negative for behavioral problems.    Allergies  Carbamazepine and Tricyclic antidepressants  Home Medications   Current Outpatient Rx  Name Route Sig Dispense Refill  . ACETAMINOPHEN 325 MG PO TABS Oral Take 650 mg by mouth every morning. At 8am    . ACETAMINOPHEN ER 650 MG PO TBCR Oral Take 650 mg by mouth every 8 (eight) hours as needed. For pain    . ASPIRIN EC 81 MG PO TBEC Oral Take 81 mg by mouth daily.    Marland Kitchen VITAMIN D 1000 UNITS PO TABS Oral Take 4,000 Units by mouth daily.    Marland Kitchen ENSURE PLUS PO LIQD Oral Take 237 mLs by mouth 2 (two) times daily between meals.    Di Kindle SULFATE 325 (65 FE) MG PO TABS Oral Take 325 mg by mouth daily with breakfast.    . GABAPENTIN 600 MG PO TABS Oral Take 600 mg by mouth.    Marland Kitchen LATANOPROST 0.005 % OP SOLN Both Eyes Place 1 drop into both eyes at bedtime.    Marland Kitchen LEVETIRACETAM 500 MG PO TABS Oral Take 500 mg by mouth 2 (two) times daily.    Marland Kitchen LISINOPRIL 20 MG PO TABS Oral Take 20 mg by mouth daily.    . ADULT MULTIVITAMIN W/MINERALS CH Oral Take 1 tablet by mouth daily.    Marland Kitchen PHENYTOIN SODIUM EXTENDED 100 MG PO CAPS Oral Take 100 mg by mouth 2 (two) times daily.    . TRAMADOL HCL 50 MG PO TABS Oral Take  100 mg by mouth every 6 (six) hours as needed. For headache. Do not exceed 4 in 24 hours.      BP 133/78  Pulse 96  Temp(Src) 99.3 F (37.4 C) (Oral)  Resp 14  SpO2 99%  Physical Exam  Constitutional: He appears well-developed and well-nourished.  HENT:       Postsurgical changes to scalp. Small lacerations to time. No active bleeding.  Eyes: Pupils are equal, round, and reactive to light.  Neck: Normal range of motion.  Cardiovascular: Normal rate and regular rhythm.   Pulmonary/Chest: Effort normal and breath sounds normal.  Abdominal: There is no tenderness. There is no rebound and no guarding.  Musculoskeletal:       Chronic right-sided weakness with some  contraction.  Neurological: He is alert.       Patient is awake and appropriate. He doesn't have a seizure. Able to answer questions. He is mildly confused today. Chronic right-sided weakness.  Skin: Skin is warm and dry.    ED Course  Procedures (including critical care time)  Labs Reviewed  URINALYSIS, ROUTINE W REFLEX MICROSCOPIC - Abnormal; Notable for the following:    Hgb urine dipstick TRACE (*)    All other components within normal limits  GLUCOSE, CAPILLARY - Abnormal; Notable for the following:    Glucose-Capillary 106 (*)    All other components within normal limits  URINE MICROSCOPIC-ADD ON - Abnormal; Notable for the following:    Squamous Epithelial / LPF MANY (*)    Bacteria, UA FEW (*)    All other components within normal limits  PHENYTOIN LEVEL, TOTAL  BASIC METABOLIC PANEL   No results found.   1. Seizure       MDM  Patient last seizure last night. History of same. Patient's Dilantin is therapeutic. He does not have a UTI. He is also on Keppra. He'll be discharged home to followup as needed        Juliet Rude. Rubin Payor, MD 12/18/11 [phone]

## 2011-12-18 NOTE — ED Notes (Signed)
IV team called for IV insertion, this nurse and 2nd RN unable, IV team returned call, will be down soon. Phlebotomy in again to attempt blood draw.

## 2011-12-18 NOTE — ED Notes (Signed)
IV Team is attempting an IV start on pt after multiple attempts by ED staff.

## 2011-12-18 NOTE — ED Notes (Addendum)
Note entered on wrong pt regarding orthostatic vitals.

## 2011-12-18 NOTE — ED Notes (Signed)
MD at bedside. 

## 2012-01-17 ENCOUNTER — Encounter (HOSPITAL_COMMUNITY): Payer: Self-pay

## 2012-01-17 ENCOUNTER — Emergency Department (HOSPITAL_COMMUNITY): Payer: Medicare Other

## 2012-01-17 ENCOUNTER — Emergency Department (HOSPITAL_COMMUNITY)
Admission: EM | Admit: 2012-01-17 | Discharge: 2012-01-17 | Disposition: A | Discharge status: Skilled Nursing Facility | Payer: Medicare Other | Attending: Emergency Medicine | Admitting: Emergency Medicine

## 2012-01-17 DIAGNOSIS — G40909 Epilepsy, unspecified, not intractable, without status epilepticus: Secondary | ICD-10-CM

## 2012-01-17 DIAGNOSIS — Z7982 Long term (current) use of aspirin: Secondary | ICD-10-CM | POA: Insufficient documentation

## 2012-01-17 DIAGNOSIS — I10 Essential (primary) hypertension: Secondary | ICD-10-CM | POA: Insufficient documentation

## 2012-01-17 DIAGNOSIS — F17200 Nicotine dependence, unspecified, uncomplicated: Secondary | ICD-10-CM | POA: Insufficient documentation

## 2012-01-17 DIAGNOSIS — F079 Unspecified personality and behavioral disorder due to known physiological condition: Secondary | ICD-10-CM | POA: Insufficient documentation

## 2012-01-17 DIAGNOSIS — G40802 Other epilepsy, not intractable, without status epilepticus: Secondary | ICD-10-CM | POA: Insufficient documentation

## 2012-01-17 DIAGNOSIS — R32 Unspecified urinary incontinence: Secondary | ICD-10-CM | POA: Insufficient documentation

## 2012-01-17 DIAGNOSIS — Z79899 Other long term (current) drug therapy: Secondary | ICD-10-CM | POA: Insufficient documentation

## 2012-01-17 DIAGNOSIS — Z8673 Personal history of transient ischemic attack (TIA), and cerebral infarction without residual deficits: Secondary | ICD-10-CM | POA: Insufficient documentation

## 2012-01-17 DIAGNOSIS — R7889 Finding of other specified substances, not normally found in blood: Secondary | ICD-10-CM

## 2012-01-17 HISTORY — DX: Other psychoactive substance abuse, uncomplicated: F19.10

## 2012-01-17 HISTORY — DX: Hemiplegia, unspecified affecting right dominant side: G81.91

## 2012-01-17 HISTORY — DX: Pneumonia, unspecified organism: J18.9

## 2012-01-17 HISTORY — DX: Poisoning by hydantoin derivatives, accidental (unintentional), initial encounter: T42.0X1A

## 2012-01-17 LAB — URINALYSIS, ROUTINE W REFLEX MICROSCOPIC
Glucose, UA: NEGATIVE mg/dL
Hgb urine dipstick: NEGATIVE
Leukocytes, UA: NEGATIVE
Protein, ur: NEGATIVE mg/dL
Specific Gravity, Urine: 1.012 (ref 1.005–1.030)
pH: 6.5 (ref 5.0–8.0)

## 2012-01-17 LAB — DIFFERENTIAL
Basophils Absolute: 0 10*3/uL (ref 0.0–0.1)
Eosinophils Relative: 1 % (ref 0–5)
Lymphocytes Relative: 27 % (ref 12–46)
Monocytes Relative: 17 % — ABNORMAL HIGH (ref 3–12)
Neutro Abs: 5.4 10*3/uL (ref 1.7–7.7)

## 2012-01-17 LAB — BASIC METABOLIC PANEL
Chloride: 104 mEq/L (ref 96–112)
GFR calc Af Amer: >90 mL/min (ref >90)
GFR calc non Af Amer: >90 mL/min (ref >90)
Glucose, Bld: 99 mg/dL (ref 70–99)
Potassium: 3.9 mEq/L (ref 3.5–5.1)
Sodium: 137 mEq/L (ref 135–145)

## 2012-01-17 LAB — CBC
HCT: 32.2 % — ABNORMAL LOW (ref 39.0–52.0)
RBC: 4.6 MIL/uL (ref 4.22–5.81)
RDW: 15.3 % (ref 11.5–15.5)
WBC: 9.7 10*3/uL (ref 4.0–10.5)

## 2012-01-17 MED ORDER — ACETAMINOPHEN 325 MG PO TABS
ORAL_TABLET | ORAL | Status: AC
  Filled 2012-01-17: qty 2

## 2012-01-17 MED ORDER — PHENYTOIN SODIUM EXTENDED 100 MG PO CAPS
100.0000 mg | ORAL_CAPSULE | Freq: Once | ORAL | Status: AC
  Administered 2012-01-17: 100 mg via ORAL
  Filled 2012-01-17: qty 1

## 2012-01-17 MED ORDER — ACETAMINOPHEN 325 MG PO TABS
650.0000 mg | ORAL_TABLET | Freq: Once | ORAL | Status: AC
  Administered 2012-01-17: 650 mg via ORAL

## 2012-01-17 NOTE — ED Notes (Signed)
Pt. Blood sugar is 81 mg.

## 2012-01-17 NOTE — ED Notes (Signed)
Pt is from [facility] for possible seizure x 2 this morning.  Staff found him this morning in room not dressed and not acting right and thought maybe he had had a seizure.  A little while later attempted to go back and get him dressed but he was still in his wheel chair, slumped over, and blood running out the side of his mouth.  Pt does have a hx of seizures and CVA. VSS stable by EMS, NSR on monitor. CBG 107

## 2012-01-17 NOTE — ED Notes (Signed)
Pt placed in hallway while waiting for [facility] to transport back to facility.

## 2012-01-17 NOTE — ED Provider Notes (Signed)
I have personally seen and examined the patient.  I have discussed the plan of care with the resident.  I have reviewed the documentation on PMH/FH/Soc. History.  I have reviewed the documentation of the resident and agree.  I have reviewed and agree with the ECG interpretation(s) documented by the resident.   Pt at baseline mental status with seizure prior to arrival.  No SZ in the ED.  Stable for d/c  Joya Gaskins, MD 01/17/12 2045

## 2012-01-17 NOTE — ED Provider Notes (Signed)
History     CSN: [id number]  Arrival date & time 01/17/12  1023   First MD Initiated Contact with Patient 01/17/12 1146      Chief Complaint  Patient presents with  . Seizures    Patient is a 66 y.o. male presenting with seizures. The history is provided by the patient and the EMS personnel.  Seizures  This is a recurrent problem. The current episode started 6 to 12 hours ago. The problem has been resolved. There were 2 to 3 seizures. The most recent episode lasted 30 to 120 seconds. Associated symptoms include sleepiness. Pertinent negatives include patient does not experience confusion, no speech difficulty, no neck stiffness and no chest pain. Characteristics include eye blinking, bladder incontinence and rhythmic jerking. The episode was witnessed. The seizures did not continue in the ED. The seizure(s) had no focality. Possible causes include missed seizure meds. There has been no fever. There were no medications administered prior to arrival.    Past Medical History  Diagnosis Date  . Hypertension   . SDH (subdural hematoma)   . CVA (cerebral infarction)   . History of craniotomy   . Seizures   . Organic brain syndrome   . Dilantin toxicity   . Right hemiparesis   . Pneumonia   . Polysubstance abuse     History reviewed. No pertinent past surgical history.  History reviewed. No pertinent family history.  History  Substance Use Topics  . Smoking status: Current Everyday Smoker -- 1.0 packs/day  . Smokeless tobacco: Never Used  . Alcohol Use: No      Review of Systems  Constitutional: Negative for fever, chills and diaphoresis.  HENT: Negative for neck pain.   Respiratory: Negative for chest tightness, shortness of breath and wheezing.   Cardiovascular: Negative for chest pain, palpitations and leg swelling.  Genitourinary: Positive for bladder incontinence.  Skin: Negative for rash and wound.  Neurological: Positive for seizures and weakness (unchanged from  baseline weakness in right upper extremity). Negative for dizziness, syncope, speech difficulty, light-headedness and numbness.  Psychiatric/Behavioral: Negative for confusion.  All other systems reviewed and are negative.    Allergies  Carbamazepine and Tricyclic antidepressants  Home Medications   Current Outpatient Rx  Name Route Sig Dispense Refill  . ACETAMINOPHEN 325 MG PO TABS Oral Take 650 mg by mouth every morning. At 8am    . ACETAMINOPHEN ER 650 MG PO TBCR Oral Take 650 mg by mouth every 8 (eight) hours as needed. For pain    . ASPIRIN EC 81 MG PO TBEC Oral Take 81 mg by mouth daily.    Marland Kitchen VITAMIN D 1000 UNITS PO TABS Oral Take 4,000 Units by mouth daily.    Marland Kitchen ENSURE PLUS PO LIQD Oral Take 237 mLs by mouth 2 (two) times daily between meals.    Di Kindle SULFATE 325 (65 FE) MG PO TABS Oral Take 325 mg by mouth daily with breakfast.    . GABAPENTIN 600 MG PO TABS Oral Take 600 mg by mouth.    Marland Kitchen LATANOPROST 0.005 % OP SOLN Both Eyes Place 1 drop into both eyes at bedtime.    Marland Kitchen LEVETIRACETAM 500 MG PO TABS Oral Take 500 mg by mouth 2 (two) times daily.    Marland Kitchen LISINOPRIL 20 MG PO TABS Oral Take 20 mg by mouth daily.    . ADULT MULTIVITAMIN W/MINERALS CH Oral Take 1 tablet by mouth daily.    Marland Kitchen PHENYTOIN SODIUM EXTENDED 100 MG PO CAPS  Oral Take 100 mg by mouth 2 (two) times daily.    . TRAMADOL HCL 50 MG PO TABS Oral Take 100 mg by mouth every 6 (six) hours as needed. For headache. Do not exceed 4 in 24 hours.      BP 130/80  Pulse 80  Temp 97.6 F (36.4 C) (Oral)  Resp 18  SpO2 100%  Physical Exam  Nursing note and vitals reviewed. Constitutional: He appears well-developed and well-nourished.  HENT:  Head: Normocephalic.  Right Ear: External ear normal.  Left Ear: External ear normal.  Nose: Nose normal.  Mouth/Throat: Oropharynx is clear and moist. No oropharyngeal exudate.       Post-craniotomy well-healed scar   Eyes: Conjunctivae are normal. Pupils are equal,  round, and reactive to light.  Neck: Normal range of motion. Neck supple.  Cardiovascular: Normal rate, regular rhythm, normal heart sounds and intact distal pulses.   Pulmonary/Chest: Effort normal and breath sounds normal. No respiratory distress. He has no wheezes. He has no rales. He exhibits no tenderness.  Abdominal: Soft. Bowel sounds are normal. He exhibits no distension and no mass. There is no tenderness. There is no rebound and no guarding.  Musculoskeletal: Normal range of motion. He exhibits no edema and no tenderness.  Neurological: He is alert. He displays normal reflexes. No cranial nerve deficit. He exhibits abnormal muscle tone (contractures of right upper extremity). Coordination normal.  Skin: Skin is warm and dry. No rash noted. No erythema. No pallor.  Psychiatric: He has a normal mood and affect. His behavior is normal. Judgment and thought content normal.    ED Course  Procedures (including critical care time)  Labs Reviewed  CBC - Abnormal; Notable for the following:    Hemoglobin 10.9 (*)     HCT 32.2 (*)     MCV 70.0 (*)     MCH 23.7 (*)     All other components within normal limits  DIFFERENTIAL - Abnormal; Notable for the following:    Monocytes Relative 17 (*)     Monocytes Absolute 1.6 (*)     All other components within normal limits  PHENYTOIN LEVEL, TOTAL - Abnormal; Notable for the following:    Phenytoin Lvl 9.2 (*)     All other components within normal limits  GLUCOSE, CAPILLARY  URINALYSIS, ROUTINE W REFLEX MICROSCOPIC  BASIC METABOLIC PANEL   Dg Chest Portable 1 View  01/17/2012  *RADIOLOGY REPORT*  Clinical Data: Chest pain and shortness of breath.  PORTABLE CHEST - 1 VIEW  Comparison: Single view chest 09/01/2004.  Findings: Lungs are clear.  Heart size is normal.  No pneumothorax or pleural fluid.  IMPRESSION: No acute disease.  Original Report Authenticated By: Bernadene Bell. D'ALESSIO, [name]     1. Seizure disorder   2. Dilantin level too  low      Date: 01/17/2012  Rate: 85 bpm  Rhythm: normal sinus rhythm  QRS Axis: normal  Intervals: normal  ST/T Wave abnormalities: normal  Conduction Disutrbances:none  Narrative Interpretation: No evidence of acute ischemia  Old EKG Reviewed: unchanged (12/18/11)    MDM  66 yo M w/hx of epilepsy presents after two seizures, similar in nature to previous seizures, this morning. No si/sx of infection; no hx of recent trauma; pt now back to baseline after appearing post-ictal on arrival. Not hypoglycemic. Labs not c/w electrolyte abnormalities, renal failure, or UTI. CXR not c/w pneumonia. EKG not c/w ischemia or arrythmia. Dilantin level subtherapeutic; Dilantin administered in ED; pt  instructed to f/u with his neurologist. Patient given return precautions, including worsening of signs or symptoms. Patient instructed to follow-up with primary care physician.            Clemetine Marker, MD 01/17/12 1659

## 2012-01-17 NOTE — ED Notes (Signed)
Radiology at bedside to complete portable chest xray.

## 2012-01-17 NOTE — Discharge Instructions (Signed)
Epilepsy  A seizure (convulsion) is a sudden change in brain function that causes a change in behavior, muscle activity, or ability to remain awake and alert. If a person has recurring seizures, this is called epilepsy.  CAUSES   Epilepsy is a disorder with many possible causes. Anything that disturbs the normal pattern of brain cell activity can lead to seizures. Seizure can be caused from illness to brain damage to abnormal brain development. Epilepsy may develop because of:   An abnormality in brain wiring.   An imbalance of nerve signaling chemicals (neurotransmitters).   Some combination of these factors.  Scientists are learning an increasing amount about genetic causes of seizures.  SYMPTOMS   The symptoms of a seizure can vary greatly from one person to another. These may include:   An aura, or warning that tells a person they are about to have a seizure.   Abnormal sensations, such as abnormal smell or seeing flashing lights.   Sudden, general body stiffness.   Rhythmic jerking of the face, arm, or leg - on one or both sides.   Sudden change in consciousness.   The person may appear to be awake but not responding.   They may appear to be asleep but cannot be awakened.   Grimacing, chewing, lip smacking, or drooling.   Often there is a period of sleepiness after a seizure.  DIAGNOSIS   The description you give to your caregiver about what you experienced will help them understand your problems. Equally important is the description by any witnesses to your seizure. A physical exam, including a detailed neurological exam, is necessary. An EEG (electroencephalogram) is a painless test of your brain waves. In this test a diagram is created of your brain waves. These diagrams can be interpreted by a specialist. Pictures of your brain are usually taken with:   An MRI.   A CT scan.  Lab tests may be done to look for:   Signs of infection.   Abnormal blood chemistry.  PREVENTION   There is no way to  prevent the development of epilepsy. If you have seizures that are typically triggered by an event (such as flashing lights), try to avoid the trigger. This can help you avoid a seizure.   PROGNOSIS   Most people with epilepsy lead outwardly normal lives. While epilepsy cannot currently be cured, for some people it does eventually go away. Most seizures do not cause brain damage. It is not uncommon for people with epilepsy, especially children, to develop behavioral and emotional problems. These problems are sometimes the consequence of medicine for seizures or social stress. For some people with epilepsy, the risk of seizures restricts their independence and recreational activities. For example, some states refuse drivers licenses to people with epilepsy.  Most women with epilepsy can become pregnant. They should discuss their epilepsy and the medicine they are taking with their caregivers. Women with epilepsy have a 90 percent or better chance of having a normal, healthy baby.  RISKS AND COMPLICATIONS   People with epilepsy are at increased risk of falls, accidents, and injuries. People with epilepsy are at special risk for two life-threatening conditions. These are status epilepticus and sudden unexplained death (extremely rare). Status epilepticus is a long lasting, continuous seizure that is a medical emergency.  TREATMENT   Once epilepsy is diagnosed, it is important to begin treatment as soon as possible. For about 80 percent of those diagnosed with epilepsy, seizures can be controlled with modern medicines   and surgical techniques. Some antiepileptic drugs can interfere with the effectiveness of oral contraceptives. In 1997, the FDA approved a pacemaker for the brain the (vagus nerve stimulator). This stimulator can be used for people with seizures that are not well-controlled by medicine. Studies have shown that in some cases, children may experience fewer seizures if they maintain a strict diet. The strict  diet is called the ketogenic diet. This diet is rich in fats and low in carbohydrates.  HOME CARE INSTRUCTIONS    Your caregiver will make recommendations about driving and safety in normal activities. Follow these carefully.   Take any medicine prescribed exactly as directed.   Do any blood tests requested to monitor the levels of your medicine.   The people you live and work with should know that you are prone to seizures. They should receive instructions on how to help you. In general, a witness to a seizure should:   Cushion your head and body.   Turn you on your side.   Avoid unnecessarily restraining you.   Not place anything inside your mouth.   Call for local emergency medical help if there is any question about what has occurred.   Keep a seizure diary. Record what you recall about any seizure, especially any possible trigger.   If your caregiver has given you a follow-up appointment, it is very important to keep that appointment. Not keeping the appointment could result in permanent injury and disability. If there is any problem keeping the appointment, you must call back to this facility for assistance.  SEEK MEDICAL CARE IF:    You develop signs of infection or other illness. This might increase the risk of a seizure.   You seem to be having more frequent seizures.   Your seizure pattern is changing.  SEEK IMMEDIATE MEDICAL CARE IF:    A seizure does not stop after a few moments.   A seizure causes any difficulty in breathing.   A seizure results in a very severe headache.   A seizure leaves you with the inability to speak or use a part of your body.  MAKE SURE YOU:    Understand these instructions.   Will watch your condition.   Will get help right away if you are not doing well or get worse.  Document Released: 07/23/2005 Document Revised: 07/12/2011 Document Reviewed: 02/27/2008  [employer] Patient Information 2012 [employer]

## 2012-01-17 NOTE — ED Notes (Signed)
Pt.was undress place on monitor.[email or web address].seizure padds on rails

## 2012-01-17 NOTE — ED Notes (Signed)
MDs made aware IV team saw nothing they were able to stick. No further orders received at this time.

## 2012-01-17 NOTE — ED Notes (Signed)
Resident at bedside with medical student attempting to obtain IV access with ultrasound.

## 2012-02-21 ENCOUNTER — Emergency Department (HOSPITAL_COMMUNITY): Payer: Medicare Other

## 2012-02-21 ENCOUNTER — Encounter (HOSPITAL_COMMUNITY): Payer: Self-pay | Admitting: *Deleted

## 2012-02-21 ENCOUNTER — Emergency Department (HOSPITAL_COMMUNITY)
Admission: EM | Admit: 2012-02-21 | Discharge: 2012-02-21 | Disposition: A | Discharge status: Skilled Nursing Facility | Payer: Medicare Other | Attending: Emergency Medicine | Admitting: Emergency Medicine

## 2012-02-21 DIAGNOSIS — Z79899 Other long term (current) drug therapy: Secondary | ICD-10-CM | POA: Insufficient documentation

## 2012-02-21 DIAGNOSIS — I10 Essential (primary) hypertension: Secondary | ICD-10-CM | POA: Insufficient documentation

## 2012-02-21 DIAGNOSIS — Z8673 Personal history of transient ischemic attack (TIA), and cerebral infarction without residual deficits: Secondary | ICD-10-CM | POA: Insufficient documentation

## 2012-02-21 DIAGNOSIS — S90422A Blister (nonthermal), left great toe, initial encounter: Secondary | ICD-10-CM

## 2012-02-21 DIAGNOSIS — R21 Rash and other nonspecific skin eruption: Secondary | ICD-10-CM | POA: Insufficient documentation

## 2012-02-21 DIAGNOSIS — F17200 Nicotine dependence, unspecified, uncomplicated: Secondary | ICD-10-CM | POA: Insufficient documentation

## 2012-02-21 DIAGNOSIS — L03039 Cellulitis of unspecified toe: Secondary | ICD-10-CM | POA: Insufficient documentation

## 2012-02-21 DIAGNOSIS — L02619 Cutaneous abscess of unspecified foot: Secondary | ICD-10-CM | POA: Insufficient documentation

## 2012-02-21 DIAGNOSIS — M19079 Primary osteoarthritis, unspecified ankle and foot: Secondary | ICD-10-CM | POA: Insufficient documentation

## 2012-02-21 LAB — CBC WITH DIFFERENTIAL/PLATELET
Eosinophils Absolute: 0.2 10*3/uL (ref 0.0–0.7)
Eosinophils Relative: 3 % (ref 0–5)
HCT: 32.9 % — ABNORMAL LOW (ref 39.0–52.0)
Hemoglobin: 11 g/dL — ABNORMAL LOW (ref 13.0–17.0)
Lymphs Abs: 1.8 10*3/uL (ref 0.7–4.0)
MCH: 23.8 pg — ABNORMAL LOW (ref 26.0–34.0)
MCV: 71.1 fL — ABNORMAL LOW (ref 78.0–100.0)
Monocytes Relative: 16 % — ABNORMAL HIGH (ref 3–12)
RBC: 4.63 MIL/uL (ref 4.22–5.81)

## 2012-02-21 MED ORDER — CLINDAMYCIN HCL 150 MG PO CAPS: 300.0000 mg | ORAL_CAPSULE | Freq: Three times a day (TID) | ORAL | Status: AC

## 2012-02-21 NOTE — ED Provider Notes (Signed)
History     CSN: [id number]  Arrival date & time 02/21/12  1055   First MD Initiated Contact with Patient 02/21/12 1103      Chief Complaint  Patient presents with  . Abscess    (Consider location/radiation/quality/duration/timing/severity/associated sxs/prior treatment) Patient is a 66 y.o. male presenting with abscess. The history is provided by the patient.  Abscess  This is a new problem. The current episode started less than one week ago. The onset was gradual. The problem occurs frequently. The problem has been gradually worsening. The abscess is present on the left toes. The problem is moderate. The abscess is characterized by painfulness, blistering and swelling. The abscess first occurred at home. Pertinent negatives include no fever, no vomiting, no congestion, no rhinorrhea, no sore throat and no cough. There were no sick contacts. Recently, medical care has been given by the PCP. Services received include one or more referrals.    Past Medical History  Diagnosis Date  . Hypertension   . SDH (subdural hematoma)   . CVA (cerebral infarction)   . History of craniotomy   . Seizures   . Organic brain syndrome   . Dilantin toxicity   . Right hemiparesis   . Pneumonia   . Polysubstance abuse     History reviewed. No pertinent past surgical history.  History reviewed. No pertinent family history.  History  Substance Use Topics  . Smoking status: Current Everyday Smoker -- 1.0 packs/day  . Smokeless tobacco: Never Used  . Alcohol Use: No      Review of Systems  Constitutional: Negative for fever, activity change, appetite change and fatigue.  HENT: Negative for congestion, sore throat, facial swelling, rhinorrhea, trouble swallowing, neck pain, neck stiffness, voice change and sinus pressure.   Eyes: Negative.   Respiratory: Negative for cough, choking, chest tightness, shortness of breath and wheezing.   Cardiovascular: Negative for chest pain.    Gastrointestinal: Negative for nausea, vomiting and abdominal pain.  Genitourinary: Negative for dysuria, urgency, frequency, hematuria, flank pain and difficulty urinating.  Musculoskeletal: Negative for back pain and gait problem.  Skin: Positive for rash. Negative for wound.  Neurological: Negative for facial asymmetry, weakness, numbness and headaches.  Psychiatric/Behavioral: Negative for behavioral problems, confusion and agitation. The patient is not nervous/anxious and is not hyperactive.   All other systems reviewed and are negative.    Allergies  Carbamazepine and Tricyclic antidepressants  Home Medications   Current Outpatient Rx  Name Route Sig Dispense Refill  . ACETAMINOPHEN 325 MG PO TABS Oral Take 650 mg by mouth every morning. At 8am    . ASPIRIN EC 81 MG PO TBEC Oral Take 81 mg by mouth daily.    Marland Kitchen FERROUS SULFATE 325 (65 FE) MG PO TABS Oral Take 325 mg by mouth daily with breakfast.    . GABAPENTIN 600 MG PO TABS Oral Take 600 mg by mouth.    Marland Kitchen LATANOPROST 0.005 % OP SOLN Both Eyes Place 1 drop into both eyes at bedtime.    Marland Kitchen LISINOPRIL 20 MG PO TABS Oral Take 20 mg by mouth daily.    Marland Kitchen PHENYTOIN SODIUM EXTENDED 100 MG PO CAPS Oral Take 100 mg by mouth 2 (two) times daily.    . TRAMADOL HCL 50 MG PO TABS Oral Take 100 mg by mouth every 6 (six) hours as needed. For headache. Do not exceed 4 in 24 hours.      BP 135/77  Pulse 73  Temp 98.6 F (37  C) (Oral)  Resp 12  SpO2 100%  Physical Exam  Nursing note and vitals reviewed. Constitutional: He is oriented to person, place, and time. He appears well-developed and well-nourished. No distress.  HENT:  Head: Normocephalic and atraumatic.  Right Ear: External ear normal.  Left Ear: External ear normal.  Mouth/Throat: No oropharyngeal exudate.  Eyes: Conjunctivae and EOM are normal. Pupils are equal, round, and reactive to light. Right eye exhibits no discharge. Left eye exhibits no discharge.  Neck: Normal  range of motion. Neck supple. No JVD present. No tracheal deviation present. No thyromegaly present.  Cardiovascular: Normal rate, regular rhythm, normal heart sounds and intact distal pulses.  Exam reveals no gallop and no friction rub.   No murmur heard. Pulmonary/Chest: Effort normal and breath sounds normal. No respiratory distress. He has no wheezes. He exhibits no tenderness.  Abdominal: Soft. Bowel sounds are normal. He exhibits no distension. There is no tenderness. There is no rebound and no guarding.  Musculoskeletal: Normal range of motion. He exhibits no edema and no tenderness.       Feet:       Clear blister with surrounding redness pain on great toe of left foot at the interphalangeal joint  Lymphadenopathy:    He has no cervical adenopathy.  Neurological: He is alert and oriented to person, place, and time. No cranial nerve deficit.  Skin: Skin is warm and dry. Rash noted. He is not diaphoretic. No pallor.  Psychiatric: He has a normal mood and affect. His behavior is normal.    ED Course  INCISION AND DRAINAGE Date/Time: 02/21/2012 12:47 PM Performed by: Sherryl Manges Authorized by: Sherryl Manges Consent: Verbal consent obtained. Risks and benefits: risks, benefits and alternatives were discussed Consent given by: patient Patient understanding: patient states understanding of the procedure being performed Patient consent: the patient's understanding of the procedure matches consent given Procedure consent: procedure consent matches procedure scheduled Relevant documents: relevant documents present and verified Patient identity confirmed: arm band and verbally with patient Time out: Immediately prior to procedure a "time out" was called to verify the correct patient, procedure, equipment, support staff and site/side marked as required. Type: abscess Body area: lower extremity Location details: left big toe Anesthesia method: none needed. Patient sedated: no Scalpel  size: 11 Incision type: single straight Complexity: simple Drainage: serous Drainage amount: moderate Packing material: Vaseline gauze Patient tolerance: Patient tolerated the procedure well with no immediate complications.   (including critical care time)  Labs Reviewed - No data to display No results found.   No diagnosis found.    MDM  66 year old male patient with past medical history of seizure disorder depression presents with swelling over interphalangeal joint of left great toe. Patient noticed swelling about a week ago it is progressively getting worse associated with pain. Exam shows no redness pain no warmth and a cleared filled blister on top of the interphalangeal joint. Patient with no other rashes and normal exam as above. Will get plain films to ensure no signs of infectious invasion of the bone. Likely the abscess can be drained and the patient placed on antibiotics.  Results for orders placed during the hospital encounter of 02/21/12  CBC WITH DIFFERENTIAL      Component Value Range   WBC 5.8  4.0 - 10.5 K/uL   RBC 4.63  4.22 - 5.81 MIL/uL   Hemoglobin 11.0 (*) 13.0 - 17.0 g/dL   HCT 16.1 (*) 09.6 - 04.5 %   MCV 71.1 (*)  78.0 - 100.0 fL   MCH 23.8 (*) 26.0 - 34.0 pg   MCHC 33.4  30.0 - 36.0 g/dL   RDW 40.9  81.1 - 91.4 %   Platelets 199  150 - 400 K/uL   Neutrophils Relative 50  43 - 77 %   Neutro Abs 2.9  1.7 - 7.7 K/uL   Lymphocytes Relative 31  12 - 46 %   Lymphs Abs 1.8  0.7 - 4.0 K/uL   Monocytes Relative 16 (*) 3 - 12 %   Monocytes Absolute 0.9  0.1 - 1.0 K/uL   Eosinophils Relative 3  0 - 5 %   Eosinophils Absolute 0.2  0.0 - 0.7 K/uL   Basophils Relative 0  0 - 1 %   Basophils Absolute 0.0  0.0 - 0.1 K/uL   DG Foot Complete Left (Final result)   Result time:02/21/12 1205    Final result by Rad Results In Interface (02/21/12 12:05:53)    Narrative:   *RADIOLOGY REPORT*  Clinical Data: Left great toe swelling and redness.  LEFT FOOT -  COMPLETE 3+ VIEW  Comparison: 12/15/2005.  Findings: Mild degenerative changes at the first metatarsal phalangeal joint. Old fracture of the fifth metatarsal. No definite acute osseous abnormality.  IMPRESSION:  1. No acute osseous abnormality. 2. Mild first metatarsal phalangeal joint osteoarthritis.  Original Report Authenticated By: Reyes Ivan, [name]     Blister was incised and expressed serous fluid. Patient without signs of infection or joint involvement with no pain with range of motion of that interphalangeal joint in the great toe. Patient does have some redness around the blister. Will treat cellulitic component with by mouth antibiotics and have patient follow up with PCP for recheck. Case discussed with Dr. Baldemar Friday, MD 02/21/12 1247

## 2012-02-21 NOTE — ED Notes (Signed)
Pt arrived by [facility] from [facility], was seen by pcp today and sent here for further eval of an abscess to his right big toe.

## 2012-02-21 NOTE — ED Provider Notes (Signed)
I saw and evaluated the patient, reviewed the resident's note and I agree with the findings and plan.  Pt appears to have a blister on his toe.  No signs  Of osteo or septic joint.  Pt recently had new shoes.  Will place on oral abx with the erythema noted  Celene Kras, MD 02/21/12 1306

## 2012-08-21 ENCOUNTER — Encounter (HOSPITAL_COMMUNITY): Payer: Self-pay | Admitting: Neurology

## 2012-08-21 ENCOUNTER — Emergency Department (HOSPITAL_COMMUNITY): Payer: Medicare Other

## 2012-08-21 ENCOUNTER — Emergency Department (HOSPITAL_COMMUNITY)
Admission: EM | Admit: 2012-08-21 | Discharge: 2012-08-21 | Disposition: A | Discharge status: Home / Self Care | Payer: Medicare Other | Attending: Emergency Medicine | Admitting: Emergency Medicine

## 2012-08-21 DIAGNOSIS — Z8673 Personal history of transient ischemic attack (TIA), and cerebral infarction without residual deficits: Secondary | ICD-10-CM | POA: Insufficient documentation

## 2012-08-21 DIAGNOSIS — I10 Essential (primary) hypertension: Secondary | ICD-10-CM | POA: Insufficient documentation

## 2012-08-21 DIAGNOSIS — F17200 Nicotine dependence, unspecified, uncomplicated: Secondary | ICD-10-CM | POA: Insufficient documentation

## 2012-08-21 DIAGNOSIS — F1910 Other psychoactive substance abuse, uncomplicated: Secondary | ICD-10-CM | POA: Insufficient documentation

## 2012-08-21 DIAGNOSIS — Z7982 Long term (current) use of aspirin: Secondary | ICD-10-CM | POA: Insufficient documentation

## 2012-08-21 DIAGNOSIS — Z9889 Other specified postprocedural states: Secondary | ICD-10-CM | POA: Insufficient documentation

## 2012-08-21 DIAGNOSIS — Z8701 Personal history of pneumonia (recurrent): Secondary | ICD-10-CM | POA: Insufficient documentation

## 2012-08-21 DIAGNOSIS — Z8679 Personal history of other diseases of the circulatory system: Secondary | ICD-10-CM | POA: Insufficient documentation

## 2012-08-21 DIAGNOSIS — Z8659 Personal history of other mental and behavioral disorders: Secondary | ICD-10-CM | POA: Insufficient documentation

## 2012-08-21 DIAGNOSIS — Z8669 Personal history of other diseases of the nervous system and sense organs: Secondary | ICD-10-CM | POA: Insufficient documentation

## 2012-08-21 DIAGNOSIS — G40909 Epilepsy, unspecified, not intractable, without status epilepticus: Secondary | ICD-10-CM | POA: Insufficient documentation

## 2012-08-21 DIAGNOSIS — Z79899 Other long term (current) drug therapy: Secondary | ICD-10-CM | POA: Insufficient documentation

## 2012-08-21 LAB — POCT I-STAT, CHEM 8
BUN: 17 mg/dL (ref 6–23)
Calcium, Ion: 1.19 mmol/L (ref 1.13–1.30)
Chloride: 106 mEq/L (ref 96–112)
Glucose, Bld: 85 mg/dL (ref 70–99)
Potassium: 4.3 mEq/L (ref 3.5–5.1)

## 2012-08-21 LAB — URINALYSIS, ROUTINE W REFLEX MICROSCOPIC
Bilirubin Urine: NEGATIVE
Glucose, UA: NEGATIVE mg/dL
Hgb urine dipstick: NEGATIVE
Ketones, ur: NEGATIVE mg/dL
Leukocytes, UA: NEGATIVE
Nitrite: NEGATIVE
Protein, ur: NEGATIVE mg/dL
Specific Gravity, Urine: 1.013 (ref 1.005–1.030)
Urobilinogen, UA: 1 mg/dL (ref 0.0–1.0)
pH: 6.5 (ref 5.0–8.0)

## 2012-08-21 LAB — PHENYTOIN LEVEL, TOTAL: Phenytoin Lvl: 3.1 ug/mL — ABNORMAL LOW (ref 10.0–20.0)

## 2012-08-21 MED ORDER — PHENYTOIN SODIUM EXTENDED 300 MG PO CAPS: 300.0000 mg | ORAL_CAPSULE | Freq: Every day | ORAL | Status: DC

## 2012-08-21 MED ORDER — PHENYTOIN SODIUM EXTENDED 100 MG PO CAPS: 300.0000 mg | ORAL_CAPSULE | Freq: Every day | ORAL | Status: DC

## 2012-08-21 MED ORDER — SODIUM CHLORIDE 0.9 % IV SOLN
500.0000 mg | Freq: Once | INTRAVENOUS | Status: AC
  Administered 2012-08-21: 500 mg via INTRAVENOUS
  Filled 2012-08-21: qty 10

## 2012-08-21 NOTE — ED Notes (Signed)
Pt to CT

## 2012-08-21 NOTE — ED Notes (Signed)
c-collar removed per PA. Pt sleeping, snoring. Respirations even and unlabored.

## 2012-08-21 NOTE — ED Notes (Signed)
Called [facility] for transport.  

## 2012-08-21 NOTE — ED Provider Notes (Signed)
History     CSN: [id number]  Arrival date & time 08/21/12  1025   First MD Initiated Contact with Patient 08/21/12 1052      Chief Complaint  Patient presents with  . Seizures    (Consider location/radiation/quality/duration/timing/severity/associated sxs/prior treatment) HPI Patient presents to the emergency department after a seizure and a fall on his head at the nursing facility. He is unable to provide a history, but according to EMS he fell on his forehead. He has a history of seizures and is currently taking Keppra, Dilantin, and Tramadol.   Past Medical History  Diagnosis Date  . Hypertension   . SDH (subdural hematoma)   . CVA (cerebral infarction)   . History of craniotomy   . Seizures   . Organic brain syndrome   . Dilantin toxicity   . Right hemiparesis   . Pneumonia   . Polysubstance abuse     History reviewed. No pertinent past surgical history.  No family history on file.  History  Substance Use Topics  . Smoking status: Current Every Day Smoker -- 1.0 packs/day  . Smokeless tobacco: Never Used  . Alcohol Use: No      Review of Systems Level 5 caveat due to altered mental status, post-ictal state.  Allergies  Carbamazepine and Tricyclic antidepressants  Home Medications   Current Outpatient Rx  Name  Route  Sig  Dispense  Refill  . ACETAMINOPHEN 325 MG PO TABS   Oral   Take 650 mg by mouth every morning. At 8am         . ASPIRIN EC 81 MG PO TBEC   Oral   Take 81 mg by mouth daily.         Marland Kitchen VITAMIN D3 2000 UNITS PO TABS   Oral   Take 4,000 Units by mouth daily.         Marland Kitchen FERROUS SULFATE 325 (65 FE) MG PO TABS   Oral   Take 325 mg by mouth daily.          Marland Kitchen GABAPENTIN 600 MG PO TABS   Oral   Take 600 mg by mouth 2 (two) times daily.          Marland Kitchen LATANOPROST 0.005 % OP SOLN   Both Eyes   Place 1 drop into both eyes at bedtime.         Marland Kitchen LEVETIRACETAM ER 750 MG PO TB24   Oral   Take 3,000 mg by mouth daily.           Marland Kitchen LISINOPRIL 20 MG PO TABS   Oral   Take 20 mg by mouth daily.         . TAB-A-VITE/IRON PO TABS   Oral   Take 1 tablet by mouth daily.         Marland Kitchen OVER THE COUNTER MEDICATION   Oral   Take 1 each by mouth 3 (three) times daily with meals. Mighty Shakes 3 times daily with meals         . PHENYTOIN SODIUM EXTENDED 100 MG PO CAPS   Oral   Take 100 mg by mouth 2 (two) times daily.         . TRAMADOL HCL 50 MG PO TABS   Oral   Take 100 mg by mouth every 6 (six) hours as needed. For headache. Do not exceed 4 in 24 hours.           BP 122/73  Pulse 102  Temp  98.7 F (37.1 C) (Oral)  Resp 16  SpO2 98%  Physical Exam  Constitutional: He appears well-developed. He is cooperative. No distress. Cervical collar in place.  HENT:  Head: Normocephalic.    Eyes: Right eye exhibits abnormal extraocular motion. Left eye exhibits abnormal extraocular motion. Right pupil is not reactive. Left pupil is not reactive.  Cardiovascular: Normal rate, regular rhythm and normal heart sounds.   Pulmonary/Chest: Effort normal and breath sounds normal.  Abdominal: Soft. Normal appearance. There is no tenderness.  Neurological: He is alert. He is disoriented.       Right sided facial droop, right sided hemiparesis.   Psychiatric: His speech is slurred.    ED Course  Procedures (including critical care time)   Labs Reviewed  PHENYTOIN LEVEL, TOTAL  URINALYSIS, ROUTINE W REFLEX MICROSCOPIC   Patient's Dilantin level is low here today.  Will also stop his tramadol as this can lower her seizure threshold.  Will increase the patient's dose to 300 mg extended release Dilantin once a day.  Spoke with the pharmacist about this dose he felt this would be appropriate for the patient MDM  MDM Reviewed: vitals and nursing note Interpretation: labs and CT scan            Carlyle Dolly, PA-C 08/21/12 1500

## 2012-08-21 NOTE — ED Notes (Signed)
[facility] here to pick patient up. Pt clean and dry when leaving.

## 2012-08-21 NOTE — ED Notes (Signed)
Peter Feinstein, RN at [facility].Given instructions regarding dilantin dose change and need to stop ultram. Also, IV in left AC could have infiltrated small amount of dilantin. Ice pack on at this time. Given instructions to [facility] to monitor site, use ice/heat.

## 2012-08-21 NOTE — ED Notes (Signed)
Per ems- pt comes from [facility] where staff heard a "boom", went in to find pt on ground. Pt has hx of seizures. Pt was postictal, non-verbal initially. Pt nodding head yes or no at this time. Pt is alert, no incontinence. Small bump to forehead. BP 136/96, HR 110, CBG 114, RR 20. Pt immobilized. Pt has 20 gauge to left [facility]

## 2012-08-21 NOTE — ED Provider Notes (Signed)
Medical screening examination/treatment/procedure(s) were conducted as a shared visit with non-physician practitioner(s) and myself.  I personally evaluated the patient during the encounter  Levon Boettcher, MD 08/21/12 1704 

## 2012-08-21 NOTE — ED Notes (Signed)
IV site intact, flushes well. No signs of infiltration.

## 2012-08-21 NOTE — ED Notes (Signed)
Pt is alert. Pt confirms having seizure. Resting, arms crossed over chest. Area of wetness to left side of pants, assuming urine. Dried blood to bottom lip, no injury to tongue, small bite mark to bottom inside lip.

## 2012-08-21 NOTE — ED Notes (Signed)
Pt remains postictal. Alert to voice, reporting "My head hurts. I had a seizure". Vitals stable.

## 2012-08-21 NOTE — ED Provider Notes (Signed)
Patient with suspected seizure earlier today. felll at the assisted-living facility. On exam patient is alert speech is slurred, follow simple commands. HEENT exam Right-sided mouth. Dime size hematoma center forehead. droop. Lungs clear auscultation heart regular rate and rhythm abdomen nontender neurologic follow simple commands, right-sided hemiparesis. Right upper to me with flexion contracture. All 4 extremities without contusion abrasion or tenderness  Doug Sou, MD 08/21/12 1510

## 2012-08-21 NOTE — ED Notes (Signed)
Pt has abrasion to center of forehead. Reddened, swelling present. About 1 x 1.

## 2012-08-21 NOTE — ED Notes (Addendum)
After changing patient, c/o left arm pain around IV site. No redness noted, very small amount of swelling. IV removed, PA at bedside to assess. Ice pack applied.

## 2012-09-02 IMAGING — CT CT CERVICAL SPINE W/O CM
3 of 6 series · 6 of 20 positions shown, 7 images · non-contrast
Comparison: Head CT 06/05/2010.

CT HEAD

CLINICAL DATA: Fell.  Hit head.

CT HEAD WITHOUT CONTRAST
CT CERVICAL SPINE WITHOUT CONTRAST
TECHNIQUE: Multidetector CT imaging of the head and cervical spine
was performed following the standard protocol without intravenous
contrast.  Multiplanar CT image reconstructions of the cervical
spine were also generated.

[Series 5: recon 2: cervical spine · axial · 0.34mm/px · z∈[-258,-191]mm · 2 of 81 slices shown]
[im 27/81  bone]
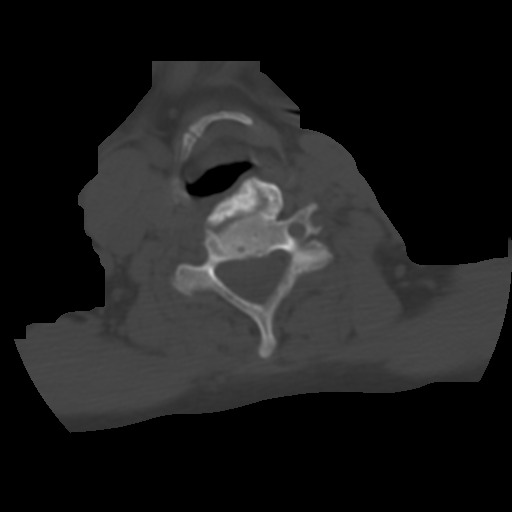
[im 54/81  bone]
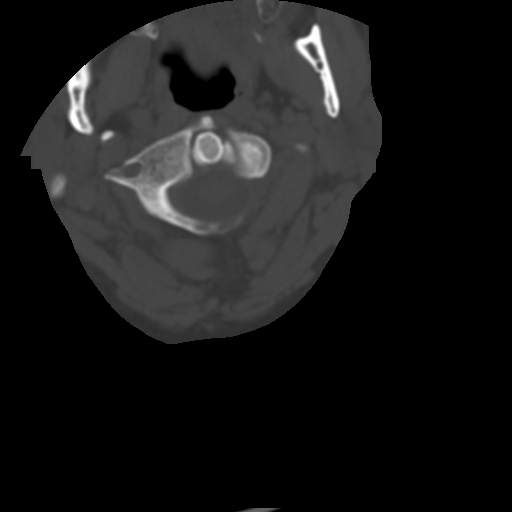

[Series 600: reformatted · coronal · 0.40mm/px · 1 of 56 slices shown (1 of 2)]
[im 28/56  bone]
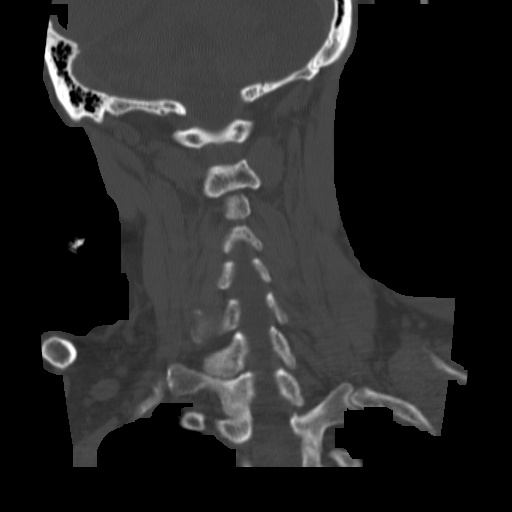

[Series 602: reformatted · axial · 0.40mm/px · z∈[-294,-206]mm · 3 of 96 slices shown, 4 images (2 of 2)]
[im 24/96  soft-tissue]
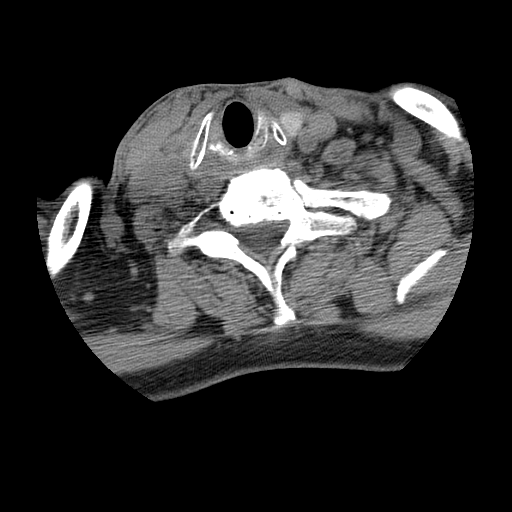
[im 24/96  bone]
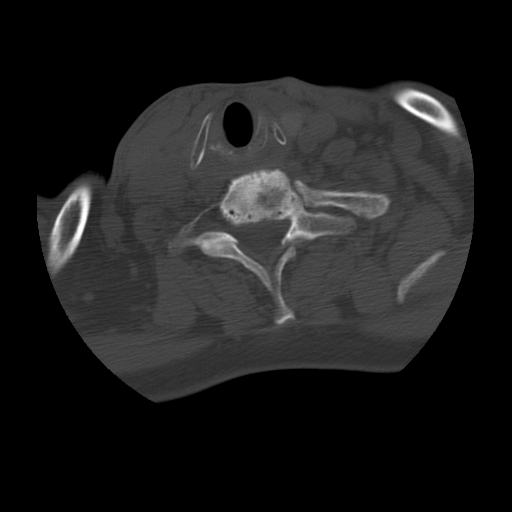
[im 48/96  bone]
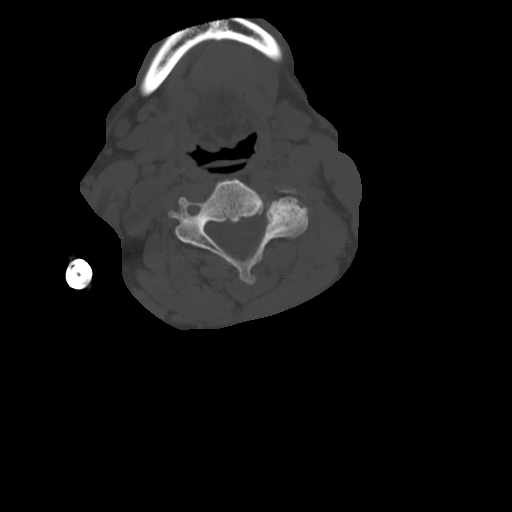
[im 72/96  bone]
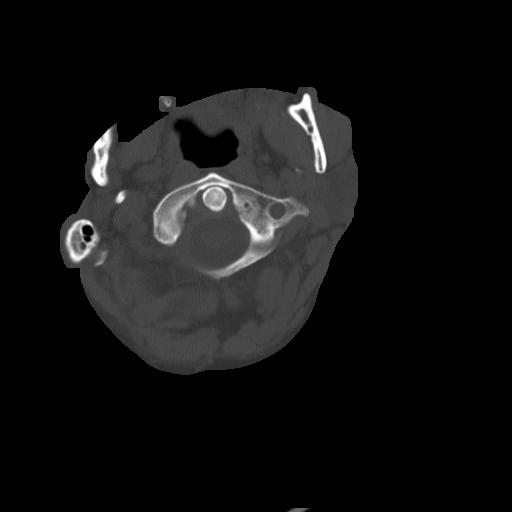

[6 of 20 positions shown; findings below may reference images not displayed]

FINDINGS: Stable cerebral atrophy, ventriculomegaly and
periventricular white matter disease.  Stable surgical changes from
a large right craniotomy.  No extra-axial fluid collections are
seen.  No CT findings for acute hemispheric infarction or
intracranial hemorrhage.  Cerebellar atrophy is noted.  Benign
appearing bilateral basal ganglia calcifications.

No acute skull fracture.  Cranioplasty changes noted on the right.
The mastoid air cells are clear.  The paranasal sinuses are clear.
The globes are intact.
IMPRESSION: 1.  Remote postsurgical changes.
2.  Stable atrophy and white matter changes.
3.  No acute intracranial abnormality or acute skull fracture.

CT CERVICAL SPINE
FINDINGS: The sagittal reformatted images demonstrate normal
alignment of the cervical vertebral bodies.  Moderate degenerative
cervical spondylosis with disc disease and facet disease.  No acute
bony findings or abnormal prevertebral soft tissue swelling.

The facets are normally aligned.  No facet or laminar fractures are
seen. No large disc protrusions.  The neural foramen are patent.

The skull base C1 and C1-C2 articulations are maintained.  The dens
is normal.

There are scattered cervical lymph nodes.  The lung apices are
clear.
IMPRESSION: Normal alignment and no acute bony findings.
2.  Moderate degenerative cervical spondylosis with disc disease
and facet disease.

## 2013-03-11 ENCOUNTER — Encounter (HOSPITAL_COMMUNITY): Payer: Self-pay | Admitting: *Deleted

## 2013-03-11 ENCOUNTER — Emergency Department (HOSPITAL_COMMUNITY)
Admission: EM | Admit: 2013-03-11 | Discharge: 2013-03-12 | Disposition: A | Discharge status: Home / Self Care | Payer: Medicare Other | Attending: Emergency Medicine | Admitting: Emergency Medicine

## 2013-03-11 DIAGNOSIS — R7889 Finding of other specified substances, not normally found in blood: Secondary | ICD-10-CM

## 2013-03-11 DIAGNOSIS — Z79899 Other long term (current) drug therapy: Secondary | ICD-10-CM | POA: Insufficient documentation

## 2013-03-11 DIAGNOSIS — G40909 Epilepsy, unspecified, not intractable, without status epilepticus: Secondary | ICD-10-CM | POA: Insufficient documentation

## 2013-03-11 DIAGNOSIS — I10 Essential (primary) hypertension: Secondary | ICD-10-CM | POA: Insufficient documentation

## 2013-03-11 DIAGNOSIS — F17200 Nicotine dependence, unspecified, uncomplicated: Secondary | ICD-10-CM | POA: Insufficient documentation

## 2013-03-11 DIAGNOSIS — Z87820 Personal history of traumatic brain injury: Secondary | ICD-10-CM | POA: Insufficient documentation

## 2013-03-11 DIAGNOSIS — Z8669 Personal history of other diseases of the nervous system and sense organs: Secondary | ICD-10-CM | POA: Insufficient documentation

## 2013-03-11 DIAGNOSIS — R569 Unspecified convulsions: Secondary | ICD-10-CM

## 2013-03-11 DIAGNOSIS — F1910 Other psychoactive substance abuse, uncomplicated: Secondary | ICD-10-CM | POA: Insufficient documentation

## 2013-03-11 DIAGNOSIS — Z8701 Personal history of pneumonia (recurrent): Secondary | ICD-10-CM | POA: Insufficient documentation

## 2013-03-11 DIAGNOSIS — Z8673 Personal history of transient ischemic attack (TIA), and cerebral infarction without residual deficits: Secondary | ICD-10-CM | POA: Insufficient documentation

## 2013-03-11 DIAGNOSIS — Z789 Other specified health status: Secondary | ICD-10-CM | POA: Insufficient documentation

## 2013-03-11 DIAGNOSIS — Z7982 Long term (current) use of aspirin: Secondary | ICD-10-CM | POA: Insufficient documentation

## 2013-03-11 DIAGNOSIS — Z593 Problems related to living in residential institution: Secondary | ICD-10-CM | POA: Insufficient documentation

## 2013-03-11 NOTE — ED Provider Notes (Signed)
CSN: [id number]     Arrival date & time 03/11/13  2256 History     First MD Initiated Contact with Patient 03/11/13 2300     Chief Complaint  Patient presents with  . Seizures   (Consider location/radiation/quality/duration/timing/severity/associated sxs/prior Treatment) HPI 67 yo male presents to the ER from his nursing facility via EMS with reported seizure.  Pt with h/o seizure disorder, CVA, SDH.  Pt takes dilantin for his seizures.  Per report, pt from [facility], staff found him having seizure.  No reported injury.  Pt without complaints at this time, does not remember the event.    Past Medical History  Diagnosis Date  . Hypertension   . SDH (subdural hematoma)   . CVA (cerebral infarction)   . History of craniotomy   . Seizures   . Organic brain syndrome   . Dilantin toxicity   . Right hemiparesis   . Pneumonia   . Polysubstance abuse    No past surgical history on file. No family history on file. History  Substance Use Topics  . Smoking status: Current Every Day Smoker -- 1.00 packs/day  . Smokeless tobacco: Never Used  . Alcohol Use: No    Review of Systems  All other systems reviewed and are negative.    Allergies  Carbamazepine and Tricyclic antidepressants  Home Medications   Current Outpatient Rx  Name  Route  Sig  Dispense  Refill  . acetaminophen (TYLENOL) 325 MG tablet   Oral   Take 650 mg by mouth every morning. At 8am         . aspirin EC 81 MG tablet   Oral   Take 81 mg by mouth daily.         . Cholecalciferol (VITAMIN D3) 2000 UNITS TABS   Oral   Take 4,000 Units by mouth daily.         . ferrous sulfate 325 (65 FE) MG tablet   Oral   Take 325 mg by mouth 2 (two) times daily.          . Levetiracetam 750 MG TB24   Oral   Take 1,500 mg by mouth daily.          Marland Kitchen lisinopril (PRINIVIL,ZESTRIL) 20 MG tablet   Oral   Take 20 mg by mouth daily.         . Multiple Vitamins-Iron (MULTIVITAMINS WITH IRON) TABS   Oral  Take 1 tablet by mouth daily.         . phenytoin (DILANTIN) 100 MG ER capsule   Oral   Take 100 mg by mouth 2 (two) times daily.         . vitamin C (ASCORBIC ACID) 500 MG tablet   Oral   Take 500 mg by mouth 2 (two) times daily.          BP 147/78  Pulse 75  Temp(Src) 98.6 F (37 C) (Oral)  Resp 10  SpO2 98% Physical Exam  Nursing note and vitals reviewed. Constitutional: He is oriented to person, place, and time. He appears well-developed and well-nourished.  HENT:  Head: Normocephalic and atraumatic.  Nose: Nose normal.  Mouth/Throat: Oropharynx is clear and moist.  Eyes: Conjunctivae and EOM are normal. Pupils are equal, round, and reactive to light.  Neck: Normal range of motion. Neck supple. No JVD present. No tracheal deviation present. No thyromegaly present.  Cardiovascular: Normal rate, regular rhythm, normal heart sounds and intact distal pulses.  Exam reveals no  gallop and no friction rub.   No murmur heard. Pulmonary/Chest: Effort normal and breath sounds normal. No stridor. No respiratory distress. He has no wheezes. He has no rales. He exhibits no tenderness.  Abdominal: Soft. Bowel sounds are normal. He exhibits no distension and no mass. There is no tenderness. There is no rebound and no guarding.  Musculoskeletal: Normal range of motion. He exhibits no edema and no tenderness.  Right UE contracture  Lymphadenopathy:    He has no cervical adenopathy.  Neurological: He is alert and oriented to person, place, and time. He exhibits abnormal muscle tone. Coordination abnormal.  RUE contracture, decreased movement RLE  Skin: Skin is warm and dry. No rash noted. No erythema. No pallor.  Psychiatric: He has a normal mood and affect. His behavior is normal. Judgment and thought content normal.    ED Course   Procedures (including critical care time)  Labs Reviewed  PHENYTOIN LEVEL, TOTAL - Abnormal; Notable for the following:    Phenytoin Lvl 8.0 (*)     All other components within normal limits  URINALYSIS, ROUTINE W REFLEX MICROSCOPIC - Abnormal; Notable for the following:    APPearance CLOUDY (*)    Protein, ur 30 (*)    All other components within normal limits  CBC WITH DIFFERENTIAL - Abnormal; Notable for the following:    Hemoglobin 11.2 (*)    HCT 32.2 (*)    MCV 68.8 (*)    MCH 23.9 (*)    RDW 16.0 (*)    All other components within normal limits  URINE MICROSCOPIC-ADD ON - Abnormal; Notable for the following:    Squamous Epithelial / LPF FEW (*)    All other components within normal limits  BASIC METABOLIC PANEL  CBC WITH DIFFERENTIAL   No results found. 1. Seizure   2. Subtherapeutic serum dilantin level     MDM  67 yo male with seizure, h/o same.  Will check baseline labs, ua, dilantin level.  Pt back at his baseline, no current complaints.  Olivia Mackie, MD 03/12/13 [phone]

## 2013-03-11 NOTE — ED Notes (Signed)
Patient arrived via EMS from [facility].  Staff reports they walked in to get him ready for bed and he was having a seizure.  Staff unsure how long he was seizing.  EMS reports he was posticdal enroute but has come around and does answer questions

## 2013-03-12 LAB — URINALYSIS, ROUTINE W REFLEX MICROSCOPIC
Glucose, UA: NEGATIVE mg/dL
Ketones, ur: NEGATIVE mg/dL
Leukocytes, UA: NEGATIVE
Nitrite: NEGATIVE
Specific Gravity, Urine: 1.023 (ref 1.005–1.030)
pH: 6 (ref 5.0–8.0)

## 2013-03-12 LAB — CBC WITH DIFFERENTIAL/PLATELET
Basophils Absolute: 0 10*3/uL (ref 0.0–0.1)
Eosinophils Absolute: 0.1 10*3/uL (ref 0.0–0.7)
HCT: 32.2 % — ABNORMAL LOW (ref 39.0–52.0)
Lymphocytes Relative: 23 % (ref 12–46)
MCHC: 34.8 g/dL (ref 30.0–36.0)
Neutro Abs: 4.7 10*3/uL (ref 1.7–7.7)
Neutrophils Relative %: 64 % (ref 43–77)
Platelets: 189 10*3/uL (ref 150–400)
RDW: 16 % — ABNORMAL HIGH (ref 11.5–15.5)
WBC: 7.4 10*3/uL (ref 4.0–10.5)

## 2013-03-12 LAB — BASIC METABOLIC PANEL
CO2: 23 mEq/L (ref 19–32)
Calcium: 9.1 mg/dL (ref 8.4–10.5)
Chloride: 101 mEq/L (ref 96–112)
Glucose, Bld: 83 mg/dL (ref 70–99)
Sodium: 136 mEq/L (ref 135–145)

## 2013-03-12 LAB — URINE MICROSCOPIC-ADD ON

## 2013-03-12 MED ORDER — PHENYTOIN SODIUM EXTENDED 100 MG PO CAPS
300.0000 mg | ORAL_CAPSULE | Freq: Three times a day (TID) | ORAL | Status: DC
  Administered 2013-03-12: 300 mg via ORAL
  Filled 2013-03-12: qty 3

## 2013-03-12 MED ORDER — SODIUM CHLORIDE 0.9 % IV SOLN
500.0000 mg | Freq: Once | INTRAVENOUS | Status: AC
  Administered 2013-03-12: 500 mg via INTRAVENOUS
  Filled 2013-03-12 (×2): qty 10

## 2013-03-12 MED ORDER — SODIUM CHLORIDE 0.9 % IV SOLN: 1000.0000 mg | Freq: Once | INTRAVENOUS | Status: DC

## 2013-03-12 NOTE — ED Notes (Signed)
Patient holds a conversation with you, answers questions appropriately.

## 2013-03-12 NOTE — ED Notes (Signed)
Times 3 for another IV.  No success

## 2013-03-12 NOTE — ED Notes (Signed)
Report called to the facility.  [facility] here to transport

## 2013-05-11 ENCOUNTER — Encounter (HOSPITAL_COMMUNITY): Payer: Self-pay | Admitting: Emergency Medicine

## 2013-05-11 ENCOUNTER — Emergency Department (HOSPITAL_COMMUNITY)
Admission: EM | Admit: 2013-05-11 | Discharge: 2013-05-12 | Disposition: A | Discharge status: Skilled Nursing Facility | Payer: Medicare Other | Attending: Emergency Medicine | Admitting: Emergency Medicine

## 2013-05-11 DIAGNOSIS — Z87820 Personal history of traumatic brain injury: Secondary | ICD-10-CM | POA: Insufficient documentation

## 2013-05-11 DIAGNOSIS — R296 Repeated falls: Secondary | ICD-10-CM | POA: Insufficient documentation

## 2013-05-11 DIAGNOSIS — Y9210 Unspecified residential institution as the place of occurrence of the external cause: Secondary | ICD-10-CM | POA: Insufficient documentation

## 2013-05-11 DIAGNOSIS — S0990XA Unspecified injury of head, initial encounter: Secondary | ICD-10-CM

## 2013-05-11 DIAGNOSIS — Z79899 Other long term (current) drug therapy: Secondary | ICD-10-CM | POA: Insufficient documentation

## 2013-05-11 DIAGNOSIS — F17200 Nicotine dependence, unspecified, uncomplicated: Secondary | ICD-10-CM | POA: Insufficient documentation

## 2013-05-11 DIAGNOSIS — S0181XA Laceration without foreign body of other part of head, initial encounter: Secondary | ICD-10-CM

## 2013-05-11 DIAGNOSIS — Y9389 Activity, other specified: Secondary | ICD-10-CM | POA: Insufficient documentation

## 2013-05-11 DIAGNOSIS — I10 Essential (primary) hypertension: Secondary | ICD-10-CM | POA: Insufficient documentation

## 2013-05-11 DIAGNOSIS — Z7982 Long term (current) use of aspirin: Secondary | ICD-10-CM | POA: Insufficient documentation

## 2013-05-11 DIAGNOSIS — S0180XA Unspecified open wound of other part of head, initial encounter: Secondary | ICD-10-CM | POA: Insufficient documentation

## 2013-05-11 DIAGNOSIS — Z7901 Long term (current) use of anticoagulants: Secondary | ICD-10-CM | POA: Insufficient documentation

## 2013-05-11 DIAGNOSIS — Z8673 Personal history of transient ischemic attack (TIA), and cerebral infarction without residual deficits: Secondary | ICD-10-CM | POA: Insufficient documentation

## 2013-05-11 DIAGNOSIS — G40909 Epilepsy, unspecified, not intractable, without status epilepticus: Secondary | ICD-10-CM | POA: Insufficient documentation

## 2013-05-11 NOTE — ED Notes (Signed)
Per EMS pt came from [facility] where he fell forward from standing and hit his head. Denies LOC, remembers falling. Pt denies neck or back pain and therefore was no immobilized. Pt has residual right sided deficits from a previous CVA. Pt is AAOx4, has slow slurred speech which is baseline for him. Pt has two lacerations to fore head, no other injuries noted.

## 2013-05-11 NOTE — ED Notes (Signed)
Pt. States he was trying to get up and go to restroom and fell forward. Laceration to right forehead. Reports mild HA. Denies any other pain. Alert and oriented x4. Right sided deficits from previous stroke.

## 2013-05-12 ENCOUNTER — Emergency Department (HOSPITAL_COMMUNITY): Payer: Medicare Other

## 2013-05-12 NOTE — ED Notes (Signed)
[facility] called for transport back to [facility]

## 2013-05-12 NOTE — ED Notes (Signed)
Pt. Discharged back to [facility] by [facility]

## 2013-05-12 NOTE — ED Provider Notes (Signed)
CSN: [id number]     Arrival date & time 05/11/13  2322 History   First MD Initiated Contact with Patient 05/12/13 0005     Chief Complaint  Patient presents with  . Fall  . Head Laceration    HPI Patient presents from a nursing facility where he fell forward and struck her forehead.  This resulted in a laceration of his right forehead.  No reported loss consciousness.  The patient is on blood thinners.  He does take Dilantin for history of seizures.  No seizure activity noted.  Patient has a history of hemiparesis and slurred speech due to prior CVA.  He denies back pain or hip pain.  Mild headache at this time.  No bowel or bladder complaints.  No abdominal pain.  No chest pain shortness of breath.  Symptoms are mild in severity.   Past Medical History  Diagnosis Date  . Hypertension   . SDH (subdural hematoma)   . CVA (cerebral infarction)   . History of craniotomy   . Seizures   . Organic brain syndrome   . Dilantin toxicity   . Right hemiparesis   . Pneumonia   . Polysubstance abuse    History reviewed. No pertinent past surgical history. No family history on file. History  Substance Use Topics  . Smoking status: Current Every Day Smoker -- 1.00 packs/day  . Smokeless tobacco: Never Used  . Alcohol Use: No    Review of Systems  All other systems reviewed and are negative.    Allergies  Carbamazepine and Tricyclic antidepressants  Home Medications   Current Outpatient Rx  Name  Route  Sig  Dispense  Refill  . acetaminophen (TYLENOL) 325 MG tablet   Oral   Take 650 mg by mouth every morning. At 8am         . aspirin EC 81 MG tablet   Oral   Take 81 mg by mouth daily.         . Cholecalciferol (VITAMIN D3) 2000 UNITS TABS   Oral   Take 4,000 Units by mouth daily.         . ferrous sulfate 325 (65 FE) MG tablet   Oral   Take 325 mg by mouth 2 (two) times daily.          Marland Kitchen gabapentin (NEURONTIN) 600 MG tablet   Oral   Take 600 mg by mouth 2 (two)  times daily.         Marland Kitchen latanoprost (XALATAN) 0.005 % ophthalmic solution   Both Eyes   Place 1 drop into both eyes at bedtime.         . Levetiracetam 750 MG TB24   Oral   Take 1,500 mg by mouth daily.          Marland Kitchen lisinopril (PRINIVIL,ZESTRIL) 20 MG tablet   Oral   Take 20 mg by mouth daily.         . Multiple Vitamins-Iron (MULTIVITAMINS WITH IRON) TABS   Oral   Take 1 tablet by mouth daily.         . phenytoin (DILANTIN) 100 MG ER capsule   Oral   Take 100 mg by mouth 3 (three) times daily.          . traMADol (ULTRAM) 50 MG tablet   Oral   Take 100 mg by mouth every 6 (six) hours as needed for pain.         . vitamin C (ASCORBIC ACID) 500  MG tablet   Oral   Take 500 mg by mouth 2 (two) times daily.          BP 118/74  Pulse 62  SpO2 100% Physical Exam  Nursing note and vitals reviewed. Constitutional: He is oriented to person, place, and time. He appears well-developed and well-nourished.  HENT:  Head: Normocephalic and atraumatic.  Superficial laceration to his right forehead.  There is no gaping of the skin.  No active bleeding.  Full range of motion bilateral eyes  Eyes: EOM are normal.  Neck: Normal range of motion.  c spine nontender.  Cardiovascular: Normal rate, regular rhythm, normal heart sounds and intact distal pulses.   Pulmonary/Chest: Effort normal and breath sounds normal. No respiratory distress.  Abdominal: Soft. He exhibits no distension. There is no tenderness.  Musculoskeletal: Normal range of motion.  Neurological: He is alert and oriented to person, place, and time.  Skin: Skin is warm and dry.  Psychiatric: He has a normal mood and affect. Judgment normal.    ED Course  Procedures (including critical care time)  LACERATION REPAIR Performed by: Lyanne Co Consent: Verbal consent obtained. Risks and benefits: risks, benefits and alternatives were discussed Patient identity confirmed: provided demographic data Time  out performed prior to procedure Prepped and Draped in normal sterile fashion Wound explored Laceration Location: forehead Laceration Length: 4cm No Foreign Bodies seen or palpated Anesthesia:none Irrigation method: syringe Amount of cleaning: standard Skin closure: dermabond Number of sutures or staples: dermabond Technique: tissue adhesive Patient tolerance: Patient tolerated the procedure well with no immediate complications.   Labs Review Labs Reviewed - No data to display Imaging Review Ct Head Wo Contrast  05/12/2013   *RADIOLOGY REPORT*  Clinical Data: Fall, head laceration  CT HEAD WITHOUT CONTRAST  Technique:  Contiguous axial images were obtained from the base of the skull through the vertex without contrast.  Comparison: Most recent prior CT scan of the head and cervical spine 08/21/2012  Findings: Slightly limited evaluation of the intracranial contents secondary to streak artifact from metal plate overlying the right frontotemporal craniectomy site.  Within these limitations, No acute intracranial hemorrhage, acute infarction, mass lesion, mass effect, midline shift or hydrocephalus.  Gray-white differentiation is preserved throughout.  Global cerebral and cerebellar atrophy with compensatory ex vacuo dilatation of the lateral ventricles. Slight irregularity of the soft tissues overlying the forehead consistent with clinical history of laceration.  No significant scalp hematoma and no evidence of underlying calvarial fracture. The globes and orbits are intact and unremarkable.  Surgical changes of prior right frontal craniectomy with a metal plate and bone cement repair.  Normal aeration of the mastoid air cells and paranasal sinuses.  Atherosclerotic calcification noted in the cavernous carotid arteries.  IMPRESSION:  No acute intracranial abnormality.  Stable appearance of advanced cerebellar greater than cerebral atrophy and surgical changes of right frontal craniectomy.   Original  Report Authenticated By: Malachy Moan, [name]  I personally reviewed the imaging tests through PACS system I reviewed available ER/hospitalization records through the EMR    MDM  No diagnosis found. Laceration repaired with Dermabond.  CT head without acute pathology.  No C-spine tenderness.    Lyanne Co, MD 05/12/13 [phone]

## 2013-05-12 NOTE — ED Notes (Signed)
Report called to [facility] 

## 2013-05-13 ENCOUNTER — Encounter: Payer: Self-pay | Admitting: Nurse Practitioner

## 2013-05-20 ENCOUNTER — Encounter: Payer: Self-pay | Admitting: Nurse Practitioner

## 2013-05-20 ENCOUNTER — Ambulatory Visit (INDEPENDENT_AMBULATORY_CARE_PROVIDER_SITE_OTHER): Payer: Medicare Other | Admitting: Nurse Practitioner

## 2013-05-20 VITALS — BP 109/66 | HR 84 | Temp 98.7°F | Ht 66.0 in

## 2013-05-20 DIAGNOSIS — G40909 Epilepsy, unspecified, not intractable, without status epilepticus: Secondary | ICD-10-CM | POA: Insufficient documentation

## 2013-05-20 DIAGNOSIS — F17200 Nicotine dependence, unspecified, uncomplicated: Secondary | ICD-10-CM | POA: Insufficient documentation

## 2013-05-20 DIAGNOSIS — I69359 Hemiplegia and hemiparesis following cerebral infarction affecting unspecified side: Secondary | ICD-10-CM

## 2013-05-20 DIAGNOSIS — I69959 Hemiplegia and hemiparesis following unspecified cerebrovascular disease affecting unspecified side: Secondary | ICD-10-CM

## 2013-05-20 LAB — PHENYTOIN LEVEL, TOTAL: Phenytoin Lvl: 35 ug/mL (ref 10.0–20.0)

## 2013-05-20 NOTE — Progress Notes (Signed)
[facility]  PATIENT: [name] DOB: 1946-01-10   REASON FOR VISIT: follow up HISTORY FROM: patient  HISTORY OF PRESENT ILLNESS: Chief Complaint:  Seizures  HPI: [name] is a 67 year old left-handed black male with a history of polysubstance abuse in the past. The patient has a history of a head injury requiring a craniotomy in the past, and he has had problems with seizures since that time. The patient was last seen through this office in July 2010, and he was treated with Dilantin. The patient has done very well by his history over the last 3 years without any seizures. The patient was seen through the emergency room on 01/17/2012. The patient had 2 or 3 seizures at that time. The patient indicates that he has no warning that the seizure is going to happen, and he does have tongue biting with the seizures. The patient denies any loss of bowel or bladder control with the seizure. The patient denies any increased weakness on the body following a seizure. The patient indicates that he was placed on Keppra following the seizure, his Dilantin level was slightly low in the emergency room. The patient has a history of Dilantin toxicity. The Dilantin level was 9.2. The patient is sent to this office for an evaluation. 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  UPDATE 05/20/13 (LL): [name] comes in for follow up of seizures.  He has not been seen in our office since 01/30/12.  He states he is doing well, has headaches non-frequently.  He was taken to ER on 05/11/13 after a fall with lacerations over his right eye, ER reports do not mention seizure.  He is unsure what happened.  He was taken to the ER for seizures on 01/16 and 06/06, last time his Dilantin level was low and it was increased to 100 mg TID from BID. His current dose of Leviteracetam TB24 is 1500 mg daily.  REVIEW OF SYSTEMS: Full 14 system  review of systems performed and notable only for:  Constitutional: N/A  Cardiovascular: N/A  Ear/Nose/Throat: hearing loss Skin: itching  Eyes: N/A  Respiratory: N/A  Gastroitestinal: incontinence Genitourinary: inconitnence Hematology/Lymphatic: N/A  Endocrine: N/A Musculoskeletal:N/A  Allergy/Immunology: runny nose  Neurological: confusion, headache, seizure Psychiatric: depression, decreased energy, hallucinations Sleep: insomnia sometimes, restless legs   ALLERGIES: Allergies  Allergen Reactions  . Carbamazepine Other (See Comments)    Unknown on MAR  . Tricyclic Antidepressants Other (See Comments)    Unknown on [facility]    HOME MEDICATIONS: Outpatient Prescriptions Prior to Visit  Medication Sig Dispense Refill  . acetaminophen (TYLENOL) 325 MG tablet Take 650 mg by mouth every morning. At 8am      . aspirin EC 81 MG tablet Take 81 mg by mouth daily.      . Cholecalciferol (VITAMIN D3) 2000 UNITS TABS Take 4,000 Units by mouth daily.      . ferrous sulfate 325 (65 FE) MG tablet Take 325 mg by mouth 2 (two) times daily.       Marland Kitchen gabapentin (NEURONTIN) 600 MG tablet Take 600 mg by mouth 2 (two) times daily.      Marland Kitchen latanoprost (XALATAN) 0.005 % ophthalmic solution Place 1 drop into both eyes at bedtime.      . Levetiracetam 750 MG TB24 Take 1,500 mg by mouth daily.       Marland Kitchen lisinopril (PRINIVIL,ZESTRIL) 20 MG tablet Take 20 mg by mouth  daily.      . Multiple Vitamins-Iron (MULTIVITAMINS WITH IRON) TABS Take 1 tablet by mouth daily.      . phenytoin (DILANTIN) 100 MG ER capsule Take 100 mg by mouth 3 (three) times daily.       . traMADol (ULTRAM) 50 MG tablet Take 100 mg by mouth every 6 (six) hours as needed for pain.      . vitamin C (ASCORBIC ACID) 500 MG tablet Take 500 mg by mouth 2 (two) times daily.       No facility-administered medications prior to visit.    PAST MEDICAL HISTORY: Past Medical History  Diagnosis Date  . Hypertension   . SDH (subdural hematoma)     . CVA (cerebral infarction)   . History of craniotomy   . Seizures   . Organic brain syndrome   . Dilantin toxicity   . Right hemiparesis   . Pneumonia   . Polysubstance abuse     PAST SURGICAL HISTORY: Past Surgical History  Procedure Laterality Date  . Craniotomy      following head trauma    FAMILY HISTORY: No family history on file.  SOCIAL HISTORY: History   Social History  . Marital Status: Divorced    Spouse Name: N/A    Number of Children: 1  . Years of Education: 12   Occupational History  . retired    Social History Main Topics  . Smoking status: Current Every Day Smoker -- 1.00 packs/day  . Smokeless tobacco: Never Used  . Alcohol Use: No  . Drug Use: No  . Sexual Activity: No   Other Topics Concern  . Not on file   Social History Narrative  . No narrative on file     PHYSICAL EXAM  Filed Vitals:   05/20/13 1009  BP: 109/66  Pulse: 84  Temp: 98.7 F (37.1 C)  TempSrc: Oral  Height: 5\' 6"  (1.676 m)   There is no weight on file to calculate BMI.  General: Patient is alert and cooperative at the time of the examination. Head: Pupils are equal round and reactive to light.   Neck: Neck is supple, no carotid bruits noted. Respiratory: Respiratory examination is clear. Cardiovascular: Cardiovascular examination reveals a regular rate and rhythm, no obvious murmurs or rubs noted. Skin:  1+ edema is noted on the left ankle, trace on the right. Healing laceration over right eye  Neurologic Exam  Cranial Nerves: Facial symmetry is present.  Good sensation of the face to pinprick and soft touch bilaterally.  Strength of the facial muscles and the muscles to head turning.  shoulder shrug is decreased on right, with head tilt to the right.  Speech is well enunciated, no aphasia or dysarthria is noted.  Extraocular movements are full.  Visual fields are full. Motor: The patient has slight weakness with grip on the right hand, restriction of abduction  of the right arm The patient has 4 minus out of 5 strength with hip flexion on the right leg and he has a significant footdrop on the right. Extensor strength in the is near-normal.  Left-sided strength is normal. Sensory: Sensory testing is intact to pinprick, soft touch, vibratory sensation and position sense in all 4 extremities, with the exception that there is a decrease in position sense of the right foot.  No evidence of extinction is noted. Coordination: Cerebellar testing reveals good finger-to-nose and heel-to-shin bilaterally. Gait and Station:  The patient requires assistance with standing. The patient has a  circumduction gait with the right leg, able to walk only with assistance.  Patient mobilizes with a wheelchair. Reflexes: The right biceps reflex is elevated as compared to the left. Reflexes in both legs are depressed. There is a right-sided Babinski  Toes are downgoing on the left.  DIAGNOSTIC DATA (LABS, IMAGING, TESTING) - I reviewed patient records, labs, notes, testing and imaging myself where available.  Lab Results  Component Value Date   WBC 7.4 03/12/2013   HGB 11.2* 03/12/2013   HCT 32.2* 03/12/2013   MCV 68.8* 03/12/2013   PLT 189 03/12/2013      Component Value Date/Time   NA 136 03/11/2013 2326   K 4.4 03/11/2013 2326   CL 101 03/11/2013 2326   CO2 23 03/11/2013 2326   GLUCOSE 83 03/11/2013 2326   BUN 20 03/11/2013 2326   CREATININE 0.70 03/11/2013 2326   CALCIUM 9.1 03/11/2013 2326   GFRNONAA >90 03/11/2013 2326   GFRAA >90 03/11/2013 2326   CT HEAD WITHOUT CONTRAST 05/11/13 No acute intracranial abnormality. Stable appearance of advanced cerebellar greater than cerebral atrophy and surgical changes of right frontal craniectomy.  ASSESSMENT AND PLAN  67 y.o. year old male  has a past medical history of Hypertension; SDH (subdural hematoma); CVA (cerebral infarction); History of craniotomy; Seizures; Organic brain syndrome; Dilantin toxicity; Right hemiparesis; Pneumonia; and  Polysubstance abuse. here for follow up of seizure disorder.  1. History of head trauma 2. History of seizures, recent recurrence  The patient will be continued on Dilantin and Keppra at this time. Dilantin level will be checked today. The patient will followup in 6 months. The patient will contact our office if seizures recur. The patient is tolerating the medications well.  Orders Placed This Encounter  Procedures  . Phenytoin level, total   Tawny Asal LAM, MSN, NP-C 05/20/2013, 1:21 PM [facility] [address], [address] [place], [place] [address] [phone]

## 2013-05-20 NOTE — Patient Instructions (Signed)
Patient to continue on current medications, both Dilantin and Keppra.  We are checking Dilantin Level today.  Please notify our office if patient has any seizures.  Follow up visit in 6 months.

## 2013-05-20 NOTE — Progress Notes (Signed)
I have read the note, and I agree with the clinical assessment and plan.  [name],CHARLES KEITH   

## 2013-05-21 ENCOUNTER — Telehealth: Payer: Self-pay | Admitting: Nurse Practitioner

## 2013-05-21 MED ORDER — PHENYTOIN 50 MG PO CHEW: 25.0000 mg | CHEWABLE_TABLET | Freq: Every day | ORAL | Status: DC

## 2013-05-21 MED ORDER — PHENYTOIN SODIUM EXTENDED 200 MG PO CAPS: 200.0000 mg | ORAL_CAPSULE | Freq: Every day | ORAL | Status: DC

## 2013-05-21 NOTE — Telephone Encounter (Signed)
[name] dob [birth year] Dilantin Level is toxic, on 05/20/13 was 35.0.    Discontinue Phenytoin 100 mg TID.   Start Phenytoin ER 200 mg and Phenytoin 25 mg Chewable tablet daily at 8 pm on Monday 05/25/13.  Orders faxed to [facility], [facility], Attn: Lurena Joiner

## 2013-10-28 ENCOUNTER — Emergency Department (HOSPITAL_COMMUNITY)
Admission: EM | Admit: 2013-10-28 | Discharge: 2013-10-28 | Disposition: A | Discharge status: Home / Self Care | Payer: Medicare Other | Attending: Emergency Medicine | Admitting: Emergency Medicine

## 2013-10-28 ENCOUNTER — Encounter (HOSPITAL_COMMUNITY): Payer: Self-pay | Admitting: Emergency Medicine

## 2013-10-28 ENCOUNTER — Emergency Department (HOSPITAL_COMMUNITY): Payer: Medicare Other

## 2013-10-28 DIAGNOSIS — Z9889 Other specified postprocedural states: Secondary | ICD-10-CM | POA: Insufficient documentation

## 2013-10-28 DIAGNOSIS — G40909 Epilepsy, unspecified, not intractable, without status epilepticus: Secondary | ICD-10-CM

## 2013-10-28 DIAGNOSIS — Z8673 Personal history of transient ischemic attack (TIA), and cerebral infarction without residual deficits: Secondary | ICD-10-CM | POA: Insufficient documentation

## 2013-10-28 DIAGNOSIS — Z79899 Other long term (current) drug therapy: Secondary | ICD-10-CM | POA: Insufficient documentation

## 2013-10-28 DIAGNOSIS — Z7982 Long term (current) use of aspirin: Secondary | ICD-10-CM | POA: Insufficient documentation

## 2013-10-28 DIAGNOSIS — F17200 Nicotine dependence, unspecified, uncomplicated: Secondary | ICD-10-CM | POA: Insufficient documentation

## 2013-10-28 DIAGNOSIS — Z8659 Personal history of other mental and behavioral disorders: Secondary | ICD-10-CM | POA: Insufficient documentation

## 2013-10-28 DIAGNOSIS — I10 Essential (primary) hypertension: Secondary | ICD-10-CM | POA: Insufficient documentation

## 2013-10-28 DIAGNOSIS — Z8701 Personal history of pneumonia (recurrent): Secondary | ICD-10-CM | POA: Insufficient documentation

## 2013-10-28 LAB — URINALYSIS, ROUTINE W REFLEX MICROSCOPIC
BILIRUBIN URINE: NEGATIVE
Glucose, UA: NEGATIVE mg/dL
Hgb urine dipstick: NEGATIVE
KETONES UR: 15 mg/dL — AB
NITRITE: NEGATIVE
PH: 6.5 (ref 5.0–8.0)
PROTEIN: NEGATIVE mg/dL
Specific Gravity, Urine: 1.021 (ref 1.005–1.030)
UROBILINOGEN UA: 1 mg/dL (ref 0.0–1.0)

## 2013-10-28 LAB — CBC WITH DIFFERENTIAL/PLATELET
BASOS PCT: 0 % (ref 0–1)
Basophils Absolute: 0 10*3/uL (ref 0.0–0.1)
EOS ABS: 0.1 10*3/uL (ref 0.0–0.7)
Eosinophils Relative: 1 % (ref 0–5)
HCT: 31 % — ABNORMAL LOW (ref 39.0–52.0)
HEMOGLOBIN: 10.6 g/dL — AB (ref 13.0–17.0)
Lymphocytes Relative: 15 % (ref 12–46)
Lymphs Abs: 1.4 10*3/uL (ref 0.7–4.0)
MCH: 24.3 pg — ABNORMAL LOW (ref 26.0–34.0)
MCHC: 34.2 g/dL (ref 30.0–36.0)
MCV: 70.9 fL — ABNORMAL LOW (ref 78.0–100.0)
MONOS PCT: 12 % (ref 3–12)
Monocytes Absolute: 1.1 10*3/uL — ABNORMAL HIGH (ref 0.1–1.0)
NEUTROS ABS: 6.9 10*3/uL (ref 1.7–7.7)
NEUTROS PCT: 72 % (ref 43–77)
PLATELETS: 217 10*3/uL (ref 150–400)
RBC: 4.37 MIL/uL (ref 4.22–5.81)
RDW: 15 % (ref 11.5–15.5)
WBC: 9.5 10*3/uL (ref 4.0–10.5)

## 2013-10-28 LAB — BASIC METABOLIC PANEL
BUN: 18 mg/dL (ref 6–23)
CHLORIDE: 99 meq/L (ref 96–112)
CO2: 26 mEq/L (ref 19–32)
Calcium: 8.9 mg/dL (ref 8.4–10.5)
Creatinine, Ser: 0.66 mg/dL (ref 0.50–1.35)
Glucose, Bld: 106 mg/dL — ABNORMAL HIGH (ref 70–99)
POTASSIUM: 3.9 meq/L (ref 3.7–5.3)
SODIUM: 137 meq/L (ref 137–147)

## 2013-10-28 LAB — URINE MICROSCOPIC-ADD ON

## 2013-10-28 LAB — PHOSPHORUS: PHOSPHORUS: 3 mg/dL (ref 2.3–4.6)

## 2013-10-28 LAB — MAGNESIUM: MAGNESIUM: 1.6 mg/dL (ref 1.5–2.5)

## 2013-10-28 LAB — PHENYTOIN LEVEL, TOTAL: PHENYTOIN LVL: 12 ug/mL (ref 10.0–20.0)

## 2013-10-28 NOTE — ED Notes (Signed)
This RN spoke with the nurse Lucinda at [facility] about the pt. Lucinda reports than an aide was walking past the pt's room, stating that the pt "didn't look right". Glee ArvinLucinda states that the aide called the pt's name and he did not respond. Glee ArvinLucinda states that she herself went into the pt's room, called the pt's name and shook him and the pt would not respond, all the while "shaking". Glee ArvinLucinda states that aide estimates the pt was shaking for 20-25 minutes total. Lucinda states that the pt is normally A&O x 4. Rhunette CroftNanavati, MD is aware.

## 2013-10-28 NOTE — ED Notes (Signed)
[facility] is from [facility]. Nursing staff reports walking past the pt's room and seeing him shaking. Pt with hx of seizure disorder. EMS reports that the pt was sitting upright and alert upon arrival, but was not oriented, which is not normal for the pt, per nursing home staff. Nursing home staff also reports that the pt was weaker than normal, and unable to transfer himself from the bed to the chair. EMS denies active seizure en route. CBG 108.

## 2013-10-28 NOTE — Discharge Instructions (Signed)
We saw you in the ER for the seizures. All the results in the ER are normal, labs and imaging. We are not sure what is causing your symptoms. SINCE YOUR SEIZURES ARE SO WELL CONTROLLED- WE FEL IT IS BEST FOR YOU TO SEE YOUR NEUROLOGIST TO SEE IF ANY MED CHANGES ARE NEEDED. Return to the ER if there are more seizures.   Seizure, Adult A seizure is abnormal electrical activity in the brain. Seizures usually last from 30 seconds to 2 minutes. There are various types of seizures. Before a seizure, you may have a warning sensation (aura) that a seizure is about to occur. An aura may include the following symptoms:   Fear or anxiety.  Nausea.  Feeling like the room is spinning (vertigo).  Vision changes, such as seeing flashing lights or spots. Common symptoms during a seizure include:  A change in attention or behavior (altered mental status).  Convulsions with rhythmic jerking movements.  Drooling.  Rapid eye movements.  Grunting.  Loss of bladder and bowel control.  Bitter taste in the mouth.  Tongue biting. After a seizure, you may feel confused and sleepy. You may also have an injury resulting from convulsions during the seizure. HOME CARE INSTRUCTIONS   If you are given medicines, take them exactly as prescribed by your health care provider.  Keep all follow-up appointments as directed by your health care provider.  Do not swim or drive or engage in risky activity during which a seizure could cause further injury to you or others until your health care provider says it is OK.  Get adequate rest.  Teach friends and family what to do if you have a seizure. They should:  Lay you on the ground to prevent a fall.  Put a cushion under your head.  Loosen any tight clothing around your neck.  Turn you on your side. If vomiting occurs, this helps keep your airway clear.  Stay with you until you recover.  Know whether or not you need emergency care. SEEK IMMEDIATE  MEDICAL CARE IF:  The seizure lasts longer than 5 minutes.  The seizure is severe or you do not wake up immediately after the seizure.  You have an altered mental status after the seizure.  You are having more frequent or worsening seizures. Someone should drive you to the emergency department or call local emergency services (911 in [place] MAKE SURE YOU:  Understand these instructions.  Will watch your condition.  Will get help right away if you are not doing well or get worse. Document Released: 07/20/2000 Document Revised: 05/13/2013 Document Reviewed: 03/04/2013 [facility] Patient Information 2014 [place], [place].

## 2013-10-28 NOTE — ED Notes (Signed)
This RN attempted to gain IV access and draw blood x 2 and was unsuccessful. Margit BandaJohn M, RN to attempt.

## 2013-10-28 NOTE — ED Notes (Signed)
Pt moving to C29 to wait for PTAR

## 2013-10-28 NOTE — ED Notes (Signed)
Per pt, right sided weakness is normal (result of past head trauma).  Currently, he does not feel right side is weaker than normal.  Pt reports he feels generalized weakness.

## 2013-10-28 NOTE — ED Provider Notes (Signed)
CSN: [id number]     Arrival date & time 10/28/13  0138 History   First MD Initiated Contact with Patient 10/28/13 [phone]     Chief Complaint  Patient presents with  . Seizures     (Consider location/radiation/quality/duration/timing/severity/associated sxs/prior Treatment) HPI Comments: PT comes in with cc of seizure like activity. Pt has hx of seizures, and reportedly, had seizure like activity. Nursing home does a really bad job describing the event - but it appears, that patient was having some shaking, and was not responding. They are unsure how long the symptoms lasted, and there was no incontinence. Pt was confused per EMS. Pt currently aox2 (not to month), and deneis any recent infection. Pt denies nausea, emesis, fevers, chills, chest pains, shortness of breath, headaches, abdominal pain, uti like symptoms.  PMHx of seizures, on keppra and dilantin, subdural hematomas and brain injury.   Patient is a 68 y.o. male presenting with seizures. The history is provided by the patient, medical records and the nursing home.  Seizures   Past Medical History  Diagnosis Date  . Hypertension   . SDH (subdural hematoma)   . CVA (cerebral infarction)   . History of craniotomy   . Seizures   . Organic brain syndrome   . Dilantin toxicity   . Right hemiparesis   . Pneumonia   . Polysubstance abuse    Past Surgical History  Procedure Laterality Date  . Craniotomy      following head trauma   History reviewed. No pertinent family history. History  Substance Use Topics  . Smoking status: Current Every Day Smoker -- 1.00 packs/day  . Smokeless tobacco: Never Used  . Alcohol Use: No    Review of Systems  Constitutional: Negative for activity change and appetite change.  Respiratory: Negative for cough and shortness of breath.   Cardiovascular: Negative for chest pain.  Gastrointestinal: Negative for abdominal pain.  Genitourinary: Negative for dysuria.  Neurological: Positive for  seizures.  Psychiatric/Behavioral: Positive for confusion.      Allergies  Carbamazepine and Tricyclic antidepressants  Home Medications   Current Outpatient Rx  Name  Route  Sig  Dispense  Refill  . acetaminophen (TYLENOL) 325 MG tablet   Oral   Take 650 mg by mouth every morning. At 8am         . aspirin EC 81 MG tablet   Oral   Take 81 mg by mouth daily.         . Cholecalciferol (VITAMIN D3) 2000 UNITS TABS   Oral   Take 4,000 Units by mouth daily.         . ferrous sulfate 325 (65 FE) MG tablet   Oral   Take 325 mg by mouth 2 (two) times daily.          Marland Kitchen gabapentin (NEURONTIN) 600 MG tablet   Oral   Take 600 mg by mouth 2 (two) times daily.         Marland Kitchen latanoprost (XALATAN) 0.005 % ophthalmic solution   Both Eyes   Place 1 drop into both eyes at bedtime.         . Levetiracetam 750 MG TB24   Oral   Take 1,500 mg by mouth daily.          Marland Kitchen lisinopril (PRINIVIL,ZESTRIL) 20 MG tablet   Oral   Take 20 mg by mouth daily.         . Multiple Vitamins-Iron (MULTIVITAMINS WITH IRON) TABS   Oral  Take 1 tablet by mouth daily.         . phenytoin (DILANTIN) 200 MG ER capsule   Oral   Take 1 capsule (200 mg total) by mouth daily at 8 pm.   30 capsule   11   . phenytoin (DILANTIN) 50 MG tablet   Oral   Chew 0.5 tablets (25 mg total) by mouth daily at 8 pm.   15 tablet   11   . traMADol (ULTRAM) 50 MG tablet   Oral   Take 100 mg by mouth every 6 (six) hours as needed for pain.         . vitamin C (ASCORBIC ACID) 500 MG tablet   Oral   Take 500 mg by mouth 2 (two) times daily.          BP 139/71  Temp(Src) 98.1 F (36.7 C) (Oral)  Resp 15  Ht 5\' 5"  (1.651 m)  Wt 120 lb (54.432 kg)  BMI 19.97 kg/m2  SpO2 100% Physical Exam  Nursing note and vitals reviewed. Constitutional: He appears well-developed.  HENT:  Head: Normocephalic and atraumatic.  Eyes: Conjunctivae and EOM are normal. Pupils are equal, round, and reactive to  light.  Neck: Normal range of motion. Neck supple.  Cardiovascular: Normal rate and regular rhythm.   Pulmonary/Chest: Effort normal and breath sounds normal.  Abdominal: Soft. Bowel sounds are normal. He exhibits no distension. There is no tenderness. There is no rebound and no guarding.  Neurological: He is alert. No cranial nerve deficit. Coordination normal.  Oriented to self and location.  Skin: Skin is warm.    ED Course  Procedures (including critical care time) Labs Review Labs Reviewed  URINE CULTURE  CBC WITH DIFFERENTIAL  BASIC METABOLIC PANEL  MAGNESIUM  PHOSPHORUS  PHENYTOIN LEVEL, TOTAL  URINALYSIS, ROUTINE W REFLEX MICROSCOPIC   Imaging Review No results found.   EKG Interpretation None      MDM   Final diagnoses:  None    DDx: -Seizure disorder -Meningitis -Trauma -ICH -Electrolyte abnormality -Metabolic derangement -Stroke -Toxin induced seizures -Medication side effects -Hypoxia -Hypoglycemia   Pt comes in with cc of seizure like activity. Pt is now ao x 3, and appropriate. He denies any changes to his meds, and denies any recent infections. Pt's seizures are well controlled.  We ordered basic labs and infection workup with UA and CXR, all normal.  Given we are not even a 100% sure what transpired (seizure vs. Shaking), we think it is best for the patent to see his primary Neurologist or the physician managing sseizures to see if any med changes are indicated.  Pt in agreement with that plan.  Derwood KaplanAnkit Oksana Deberry, MD 10/28/13 [phone]

## 2013-10-29 LAB — URINE CULTURE: Colony Count: 75000

## 2013-11-18 ENCOUNTER — Encounter: Payer: Self-pay | Admitting: Nurse Practitioner

## 2013-11-18 ENCOUNTER — Ambulatory Visit (INDEPENDENT_AMBULATORY_CARE_PROVIDER_SITE_OTHER): Payer: Medicare Other | Admitting: Nurse Practitioner

## 2013-11-18 VITALS — BP 120/75 | HR 63

## 2013-11-18 DIAGNOSIS — I69359 Hemiplegia and hemiparesis following cerebral infarction affecting unspecified side: Secondary | ICD-10-CM

## 2013-11-18 DIAGNOSIS — I69959 Hemiplegia and hemiparesis following unspecified cerebrovascular disease affecting unspecified side: Secondary | ICD-10-CM

## 2013-11-18 DIAGNOSIS — F17200 Nicotine dependence, unspecified, uncomplicated: Secondary | ICD-10-CM

## 2013-11-18 DIAGNOSIS — G40909 Epilepsy, unspecified, not intractable, without status epilepticus: Secondary | ICD-10-CM

## 2013-11-18 MED ORDER — LEVETIRACETAM ER 750 MG PO TB24: 1500.0000 mg | ORAL_TABLET | Freq: Every day | ORAL | Status: DC

## 2013-11-18 MED ORDER — PHENYTOIN SODIUM EXTENDED 200 MG PO CAPS: 200.0000 mg | ORAL_CAPSULE | Freq: Every day | ORAL | Status: DC

## 2013-11-18 NOTE — Progress Notes (Signed)
PATIENT: [name] DOB: February 06, 1946  REASON FOR VISIT: follow up for seizure disorder HISTORY FROM: patient  HISTORY OF PRESENT ILLNESS: Mr. [name] is a 68 year old left-handed black male with a history of polysubstance abuse in the past. The patient has a history of a head injury requiring a craniotomy in the past, and he has had problems with seizures since that time. The patient was last seen through this office in July 2010, and he was treated with Dilantin. The patient has done very well by his history over the last 3 years without any seizures. The patient was seen through the emergency room on 01/17/2012. The patient had 2 or 3 seizures at that time. The patient indicates that he has no warning that the seizure is going to happen, and he does have tongue biting with the seizures. The patient denies any loss of bowel or bladder control with the seizure. The patient denies any increased weakness on the body following a seizure. The patient indicates that he was placed on Keppra following the seizure, his Dilantin level was slightly low in the emergency room. The patient has a history of Dilantin toxicity. The Dilantin level was 9.2. The patient is sent to this office for an evaluation. 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   UPDATE 05/20/13 (LL): Mr. [name] comes in for follow up of seizures. He has not been seen in our office since 01/30/12. He states he is doing well, has headaches non-frequently. He was taken to ER on 05/11/13 after a fall with lacerations over his right eye, ER reports do not mention seizure. He is unsure what happened. He was taken to the ER for seizures on 01/16 and 06/06, last time his Dilantin level was low and it was increased to 100 mg TID from BID. His current dose of Leviteracetam TB24 is 1500 mg daily.   UPDATE 11/18/13 (LL): After last visit, his Phenytoin level came back high and he  was reduced back to 200 mg total daily.  [facility] for [facility] possible seizure, but notes from visit were inconclusive; assisted living personnel could not provide detail to ER.  He was confused and there appeared to be some shaking.  His Phenytoin level was therapeutic.  He was discharged without medication changes and has not had any reoccurrence.  He states he feels well today.  Denies nausea, emesis, fevers, chills, chest pains, shortness of breath, headaches, abdominal pain, uti like symptoms, or hallucinations.  REVIEW OF SYSTEMS: Full 14 system review of systems performed and notable only for:  Light sensitivity, aching muscles, walking difficulty, joint pain, leg swelling, palpitations, restless legs, Insomnia, frequent waking memory loss, headache, seizure, tremors, bruise easily, confusion  ALLERGIES: Allergies  Allergen Reactions  . Carbamazepine Other (See Comments)    Unknown on MAR  . Tricyclic Antidepressants Other (See Comments)    [facility]    HOME MEDICATIONS: Outpatient Prescriptions Prior to Visit  Medication Sig Dispense Refill  . acetaminophen (TYLENOL) 325 MG tablet Take 650 mg by mouth daily. At 8am      . acetaminophen (TYLENOL) 650 MG CR tablet Take 650 mg by mouth every 8 (eight) hours as needed for pain.      Marland Kitchen. aspirin EC 81 MG tablet Take 81 mg by mouth daily.      . Cholecalciferol (VITAMIN D3) 2000 UNITS TABS Take 4,000 Units by mouth daily.      .Marland Kitchen  DULoxetine (CYMBALTA) 30 MG capsule Take 30 mg by mouth daily.      . ferrous sulfate 325 (65 FE) MG tablet Take 325 mg by mouth 2 (two) times daily.       Marland Kitchen gabapentin (NEURONTIN) 600 MG tablet Take 600 mg by mouth 2 (two) times daily.      . Levetiracetam 750 MG TB24 Take 1,500 mg by mouth daily.       Marland Kitchen lisinopril (PRINIVIL,ZESTRIL) 20 MG tablet Take 20 mg by mouth daily.      . Multiple Vitamins-Iron (MULTIVITAMINS WITH IRON) TABS Take 1 tablet by mouth daily.      . Nutritional Supplements  (NUTRITIONAL SHAKE PO) Take 1 each by mouth 3 (three) times daily with meals. MIGHTY SHAKES      . ondansetron (ZOFRAN) 4 MG tablet Take 4 mg by mouth every 8 (eight) hours as needed for nausea or vomiting.      . phenytoin (DILANTIN) 200 MG ER capsule Take 200 mg by mouth daily at 8 pm.      . traMADol (ULTRAM) 50 MG tablet Take 100 mg by mouth every 6 (six) hours as needed (for headache).       . vitamin C (ASCORBIC ACID) 500 MG tablet Take 500 mg by mouth 2 (two) times daily.      Marland Kitchen latanoprost (XALATAN) 0.005 % ophthalmic solution Place 1 drop into both eyes at bedtime.       No facility-administered medications prior to visit.   PHYSICAL EXAM  Filed Vitals:   11/18/13 1044  BP: 120/75  Pulse: 63   Cannot calculate BMI with a height equal to zero.  General: Patient is alert and cooperative at the time of the examination.  Head: Pupils are equal round and reactive to light.  Neck: Neck is supple, no carotid bruits noted.  Respiratory: Respiratory examination is clear.  Cardiovascular: Cardiovascular examination reveals a regular rate and rhythm, no obvious murmurs or rubs noted.  Skin: 1+ edema is noted on the left ankle, trace on the right. Healing laceration over right eye   Neurologic Exam  Cranial Nerves: Facial symmetry is present. Good sensation of the face to pinprick and soft touch bilaterally. Strength of the facial muscles and the muscles to head turning. shoulder shrug is decreased on right, with head tilt to the right. Speech is well enunciated, no aphasia or dysarthria is noted. Extraocular movements are full. Visual fields are full.  Motor: The patient has slight weakness with grip on the right hand, restriction of abduction of the right arm The patient has 4 minus out of 5 strength with hip flexion on the right leg and he has a significant footdrop on the right. Extensor strength in the is near-normal. Left-sided strength is normal.  Sensory: Sensory testing is intact to  pinprick, soft touch, vibratory sensation and position sense in all 4 extremities, with the exception that there is a decrease in position sense of the right foot. No evidence of extinction is noted.  Coordination: Cerebellar testing reveals good finger-to-nose and heel-to-shin bilaterally.  Gait and Station: The patient requires assistance with standing. The patient has a circumduction gait with the right leg, able to walk only with assistance. Patient mobilizes with a wheelchair.  Reflexes: The right biceps reflex is elevated as compared to the left. Reflexes in both legs are depressed.    ASSESSMENT AND PLAN 68 y.o. year old male has a past medical history of Hypertension; SDH (subdural hematoma); CVA (cerebral  infarction); History of craniotomy; Seizures; Organic brain syndrome; Dilantin toxicity; Right hemiparesis; Pneumonia; and Polysubstance abuse here for follow up of seizure disorder.  1. History of head trauma  2. History of seizures, recent trip to ER, questionable seizure vs. shaking  PLAN: The patient will be continued on Dilantin and Keppra at this time at same dosages.  Dilantin level at ER was therapeutic.  [facility], as [facility] lowers seizure threshold.  (It is prn for headaches, no doses charted this month). The patient will followup in 6 months. The patient will contact our office if seizures recur. The patient is tolerating the medications well.  Ronal FearLYNN E. Elija Mccamish, MSN, NP-C 11/18/2013, 11:00 AM [facility] [address], [address] [place], [place] [address] [phone]  Note: This document was prepared with digital dictation and possible smart phrase technology. Any transcriptional errors that result from this process are unintentional.

## 2013-11-18 NOTE — Progress Notes (Signed)
I have read the note, and I agree with the clinical assessment and plan.  Charles K [name]   

## 2013-11-18 NOTE — Patient Instructions (Addendum)
Continue Leviteracetam and Phenytoin at current doses.  [facility]  It lowers the seizure threshold.  Follow up in 6 months.\ with Dr. Anne HahnWillis.

## 2013-12-04 ENCOUNTER — Encounter (HOSPITAL_COMMUNITY): Payer: Self-pay | Admitting: Emergency Medicine

## 2013-12-04 ENCOUNTER — Emergency Department (HOSPITAL_COMMUNITY)
Admission: EM | Admit: 2013-12-04 | Discharge: 2013-12-05 | Disposition: A | Discharge status: Home / Self Care | Payer: Medicare Other | Attending: Emergency Medicine | Admitting: Emergency Medicine

## 2013-12-04 DIAGNOSIS — Z7982 Long term (current) use of aspirin: Secondary | ICD-10-CM | POA: Insufficient documentation

## 2013-12-04 DIAGNOSIS — Z5181 Encounter for therapeutic drug level monitoring: Secondary | ICD-10-CM

## 2013-12-04 DIAGNOSIS — R51 Headache: Secondary | ICD-10-CM | POA: Insufficient documentation

## 2013-12-04 DIAGNOSIS — G40909 Epilepsy, unspecified, not intractable, without status epilepticus: Secondary | ICD-10-CM | POA: Insufficient documentation

## 2013-12-04 DIAGNOSIS — R110 Nausea: Secondary | ICD-10-CM | POA: Insufficient documentation

## 2013-12-04 DIAGNOSIS — R519 Headache, unspecified: Secondary | ICD-10-CM

## 2013-12-04 DIAGNOSIS — Z9889 Other specified postprocedural states: Secondary | ICD-10-CM | POA: Insufficient documentation

## 2013-12-04 DIAGNOSIS — F17200 Nicotine dependence, unspecified, uncomplicated: Secondary | ICD-10-CM | POA: Insufficient documentation

## 2013-12-04 DIAGNOSIS — Z8659 Personal history of other mental and behavioral disorders: Secondary | ICD-10-CM | POA: Insufficient documentation

## 2013-12-04 DIAGNOSIS — Z87820 Personal history of traumatic brain injury: Secondary | ICD-10-CM | POA: Insufficient documentation

## 2013-12-04 DIAGNOSIS — Z79899 Other long term (current) drug therapy: Secondary | ICD-10-CM | POA: Insufficient documentation

## 2013-12-04 DIAGNOSIS — Z8701 Personal history of pneumonia (recurrent): Secondary | ICD-10-CM | POA: Insufficient documentation

## 2013-12-04 DIAGNOSIS — I10 Essential (primary) hypertension: Secondary | ICD-10-CM | POA: Insufficient documentation

## 2013-12-04 DIAGNOSIS — R892 Abnormal level of other drugs, medicaments and biological substances in specimens from other organs, systems and tissues: Secondary | ICD-10-CM | POA: Insufficient documentation

## 2013-12-04 DIAGNOSIS — Z8673 Personal history of transient ischemic attack (TIA), and cerebral infarction without residual deficits: Secondary | ICD-10-CM | POA: Insufficient documentation

## 2013-12-04 LAB — BASIC METABOLIC PANEL
BUN: 17 mg/dL (ref 6–23)
CHLORIDE: 101 meq/L (ref 96–112)
CO2: 25 mEq/L (ref 19–32)
CREATININE: 0.78 mg/dL (ref 0.50–1.35)
Calcium: 9.2 mg/dL (ref 8.4–10.5)
GFR calc Af Amer: >90 mL/min (ref >90)
GFR calc non Af Amer: >90 mL/min (ref >90)
Glucose, Bld: 94 mg/dL (ref 70–99)
Potassium: 4.4 mEq/L (ref 3.7–5.3)
Sodium: 141 mEq/L (ref 137–147)

## 2013-12-04 LAB — CBC WITH DIFFERENTIAL/PLATELET
BASOS ABS: 0 10*3/uL (ref 0.0–0.1)
Basophils Relative: 0 % (ref 0–1)
EOS PCT: 1 % (ref 0–5)
Eosinophils Absolute: 0.1 10*3/uL (ref 0.0–0.7)
HEMATOCRIT: 31.9 % — AB (ref 39.0–52.0)
Hemoglobin: 10.8 g/dL — ABNORMAL LOW (ref 13.0–17.0)
LYMPHS PCT: 24 % (ref 12–46)
Lymphs Abs: 1.9 10*3/uL (ref 0.7–4.0)
MCH: 24.1 pg — ABNORMAL LOW (ref 26.0–34.0)
MCHC: 33.9 g/dL (ref 30.0–36.0)
MCV: 71 fL — ABNORMAL LOW (ref 78.0–100.0)
MONO ABS: 1.1 10*3/uL — AB (ref 0.1–1.0)
Monocytes Relative: 14 % — ABNORMAL HIGH (ref 3–12)
Neutro Abs: 4.6 10*3/uL (ref 1.7–7.7)
Neutrophils Relative %: 61 % (ref 43–77)
Platelets: 274 10*3/uL (ref 150–400)
RBC: 4.49 MIL/uL (ref 4.22–5.81)
RDW: 15.1 % (ref 11.5–15.5)
WBC: 7.7 10*3/uL (ref 4.0–10.5)

## 2013-12-04 LAB — PHENYTOIN LEVEL, TOTAL: PHENYTOIN LVL: 6.3 ug/mL — AB (ref 10.0–20.0)

## 2013-12-04 MED ORDER — DEXAMETHASONE SODIUM PHOSPHATE 10 MG/ML IJ SOLN: 10.0000 mg | Freq: Once | INTRAMUSCULAR | Status: DC

## 2013-12-04 MED ORDER — ONDANSETRON 4 MG PO TBDP
4.0000 mg | ORAL_TABLET | Freq: Once | ORAL | Status: AC
  Administered 2013-12-04: 4 mg via ORAL
  Filled 2013-12-04: qty 1

## 2013-12-04 MED ORDER — DIPHENHYDRAMINE HCL 50 MG/ML IJ SOLN: 12.5000 mg | Freq: Once | INTRAMUSCULAR | Status: DC

## 2013-12-04 MED ORDER — METOCLOPRAMIDE HCL 5 MG/ML IJ SOLN: 10.0000 mg | Freq: Once | INTRAMUSCULAR | Status: DC

## 2013-12-04 MED ORDER — PHENYTOIN SODIUM EXTENDED 100 MG PO CAPS
200.0000 mg | ORAL_CAPSULE | Freq: Once | ORAL | Status: AC
  Administered 2013-12-04: 200 mg via ORAL
  Filled 2013-12-04: qty 2

## 2013-12-04 MED ORDER — OXYCODONE-ACETAMINOPHEN 5-325 MG PO TABS
1.0000 | ORAL_TABLET | Freq: Once | ORAL | Status: AC
  Administered 2013-12-04: 1 via ORAL
  Filled 2013-12-04: qty 1

## 2013-12-04 MED ORDER — ACETAMINOPHEN 325 MG PO TABS
650.0000 mg | ORAL_TABLET | Freq: Once | ORAL | Status: AC
  Administered 2013-12-04: 650 mg via ORAL
  Filled 2013-12-04: qty 2

## 2013-12-04 NOTE — ED Provider Notes (Signed)
Medical screening examination/treatment/procedure(s) were performed by non-physician practitioner and as supervising physician I was immediately available for consultation/collaboration.     Geoffery Lyonsouglas Carrianne Hyun, MD 12/04/13 [phone]

## 2013-12-04 NOTE — ED Notes (Signed)
PT non ambulatory- uses wheelchair at facility where he lives

## 2013-12-04 NOTE — ED Notes (Signed)
Per [facility] pt from [facility], pt c/o headache x2 weeks, taking OTC Tylenol x2 weeks with no relief. Pt has a hx of TBI with Right side weakness, no new weakness to Right side. VSS BP 142/80, HR 80, RR 16. Pt is wheel chair dependent

## 2013-12-04 NOTE — ED Notes (Signed)
Pt brought via EMS for headache. He states hes had a headache all day and he took his dilantin without relief. He states he takes dilantin for seizures also, but that he has not had a seizure today. He is A&O now, breathing easily

## 2013-12-04 NOTE — ED Notes (Addendum)
PT reports R sided HA onset this morning. Took tylenol w/ no relief. PT reports nausea, denies vomiting and photophobia. Reports hx of seizures, taking dilantin Q daily and HA is not a precursor sign for seizures. Last seizure ~883mos ago.

## 2013-12-04 NOTE — Discharge Instructions (Signed)
FOLLOW UP WITH DR. Anne HahnWILLIS IF HEADACHE RECURS. CONTINUE YOUR CURRENT MEDICATIONS AS PRESCRIBED. RETURN HERE AS NEEDED.

## 2013-12-04 NOTE — ED Notes (Signed)
Neuro deviations from norm consistent with PT visit 1 mo ago

## 2013-12-04 NOTE — ED Provider Notes (Signed)
CSN: [id number]     Arrival date & time 12/04/13  1747 History   First MD Initiated Contact with Patient 12/04/13 1942     Chief Complaint  Patient presents with  . Headache     (Consider location/radiation/quality/duration/timing/severity/associated sxs/prior Treatment) Patient is a 68 y.o. male presenting with headaches. The history is provided by the patient. No language interpreter was used.  Headache Pain location:  R parietal Chronicity:  Recurrent Associated symptoms: nausea   Associated symptoms: no fever and no vomiting   Associated symptoms comment:  He presents with right sided headache that started earlier today. Gradual onset. He denies frequent headaches but has had similar headaches in the past. He has a history of traumatic brain injury requiring craniotomy, with subsequent seizures treated with Dilantin and Keppra. He denies seizure activity today.    Past Medical History  Diagnosis Date  . Hypertension   . SDH (subdural hematoma)   . CVA (cerebral infarction)   . History of craniotomy   . Seizures   . Organic brain syndrome   . Dilantin toxicity   . Right hemiparesis   . Pneumonia   . Polysubstance abuse    Past Surgical History  Procedure Laterality Date  . Craniotomy      following head trauma   Family History  Problem Relation Age of Onset  . Heart attack Mother   . Heart attack Father    History  Substance Use Topics  . Smoking status: Current Every Day Smoker -- 1.00 packs/day  . Smokeless tobacco: Never Used  . Alcohol Use: No    Review of Systems  Constitutional: Negative for fever and chills.  Respiratory: Negative.   Cardiovascular: Negative.   Gastrointestinal: Positive for nausea. Negative for vomiting.  Musculoskeletal: Negative.   Skin: Negative.   Neurological: Positive for headaches.      Allergies  Carbamazepine and Tricyclic antidepressants  Home Medications   Prior to Admission medications   Medication Sig Start  Date End Date Taking? Authorizing Provider  acetaminophen (TYLENOL) 325 MG tablet Take 650 mg by mouth daily. At 8am   Yes Historical Provider, MD  aspirin EC 81 MG tablet Take 81 mg by mouth daily.   Yes Historical Provider, MD  Cholecalciferol (VITAMIN D3) 2000 UNITS TABS Take 4,000 Units by mouth daily.   Yes Historical Provider, MD  ferrous sulfate 325 (65 FE) MG tablet Take 325 mg by mouth 2 (two) times daily.    Yes Historical Provider, MD  gabapentin (NEURONTIN) 600 MG tablet Take 600 mg by mouth 2 (two) times daily.   Yes Historical Provider, MD  Levetiracetam 750 MG TB24 Take 2 tablets (1,500 mg total) by mouth daily. 11/18/13  Yes Ronal FearLynn E Lam, NP  lisinopril (PRINIVIL,ZESTRIL) 20 MG tablet Take 20 mg by mouth daily.   Yes Historical Provider, MD  Multiple Vitamins-Iron (MULTIVITAMINS WITH IRON) TABS Take 1 tablet by mouth daily.   Yes Historical Provider, MD  Nutritional Supplements (NUTRITIONAL SHAKE PO) Take 1 each by mouth 3 (three) times daily with meals. MIGHTY SHAKES   Yes Historical Provider, MD  phenytoin (DILANTIN) 200 MG ER capsule Take 1 capsule (200 mg total) by mouth daily at 8 pm. 11/18/13  Yes Ronal FearLynn E Lam, NP  vitamin C (ASCORBIC ACID) 500 MG tablet Take 500 mg by mouth 2 (two) times daily.   Yes Historical Provider, MD   BP 133/71  Pulse 70  Temp(Src) 99 F (37.2 C) (Oral)  Resp 15  SpO2 100%  Physical Exam  Constitutional: He is oriented to person, place, and time. He appears well-developed and well-nourished.  HENT:  Head: Normocephalic.  Right cranial deformity c/w previous craniotomy.   Eyes: Conjunctivae are normal.  Neck: Normal range of motion. Neck supple.  Cardiovascular: Normal rate and regular rhythm.   Pulmonary/Chest: Effort normal and breath sounds normal.  Abdominal: Soft. Bowel sounds are normal. There is no tenderness. There is no rebound and no guarding.  Musculoskeletal: Normal range of motion.  Neurological: He is alert and oriented to person,  place, and time.  Right upper and lower extremity paralysis c/w previous exams subsequent to TBI.   Skin: Skin is warm and dry. No rash noted.  Psychiatric: He has a normal mood and affect.    ED Course  Procedures (including critical care time) Labs Review Labs Reviewed  PHENYTOIN LEVEL, TOTAL - Abnormal; Notable for the following:    Phenytoin Lvl 6.3 (*)    All other components within normal limits  CBC WITH DIFFERENTIAL - Abnormal; Notable for the following:    Hemoglobin 10.8 (*)    HCT 31.9 (*)    MCV 71.0 (*)    MCH 24.1 (*)    Monocytes Relative 14 (*)    Monocytes Absolute 1.1 (*)    All other components within normal limits  BASIC METABOLIC PANEL   Results for orders placed during the hospital encounter of 12/04/13  PHENYTOIN LEVEL, TOTAL      Result Value Ref Range   Phenytoin Lvl 6.3 (*) 10.0 - 20.0 ug/mL  BASIC METABOLIC PANEL      Result Value Ref Range   Sodium 141  137 - 147 mEq/L   Potassium 4.4  3.7 - 5.3 mEq/L   Chloride 101  96 - 112 mEq/L   CO2 25  19 - 32 mEq/L   Glucose, Bld 94  70 - 99 mg/dL   BUN 17  6 - 23 mg/dL   Creatinine, Ser 1.610.78  0.50 - 1.35 mg/dL   Calcium 9.2  8.4 - 09.610.5 mg/dL   GFR calc non Af Amer >90  >90 mL/min   GFR calc Af Amer >90  >90 mL/min  CBC WITH DIFFERENTIAL      Result Value Ref Range   WBC 7.7  4.0 - 10.5 K/uL   RBC 4.49  4.22 - 5.81 MIL/uL   Hemoglobin 10.8 (*) 13.0 - 17.0 g/dL   HCT 04.531.9 (*) 40.939.0 - 81.152.0 %   MCV 71.0 (*) 78.0 - 100.0 fL   MCH 24.1 (*) 26.0 - 34.0 pg   MCHC 33.9  30.0 - 36.0 g/dL   RDW 91.415.1  78.211.5 - 95.615.5 %   Platelets 274  150 - 400 K/uL   Neutrophils Relative % 61  43 - 77 %   Neutro Abs 4.6  1.7 - 7.7 K/uL   Lymphocytes Relative 24  12 - 46 %   Lymphs Abs 1.9  0.7 - 4.0 K/uL   Monocytes Relative 14 (*) 3 - 12 %   Monocytes Absolute 1.1 (*) 0.1 - 1.0 K/uL   Eosinophils Relative 1  0 - 5 %   Eosinophils Absolute 0.1  0.0 - 0.7 K/uL   Basophils Relative 0  0 - 1 %   Basophils Absolute 0.0  0.0  - 0.1 K/uL     Imaging Review No results found.   EKG Interpretation None      MDM   Final diagnoses:  None    1.  Headache 2. subtherapeutic dilantin  Chart reviewed. The patient has had 10 CT scans of his head, averaging 2 per year for the last 5 years. His neurologic exam is WNL given chronic deficits. Patient acknowledges that there are no new weaknesses. Headache was temporarily relieved with Tylenol. He is comfortably watching TV on multiple rechecks. He is comfortable with a pain reliever for his headache and discharge home. He will follow up with neurology per scheduled appointments. Dilantin 200 mg given in ED as he was subtherapeutic tonight.    Arnoldo Hooker, PA-C 12/04/13 2327

## 2013-12-05 NOTE — ED Notes (Addendum)
[facility] transporting pt to [facility]. This RN spoke with Latoya at facility and informed pt is coming back to facility.

## 2014-01-29 ENCOUNTER — Encounter (HOSPITAL_COMMUNITY): Payer: Self-pay | Admitting: Emergency Medicine

## 2014-01-29 ENCOUNTER — Emergency Department (HOSPITAL_COMMUNITY)
Admission: EM | Admit: 2014-01-29 | Discharge: 2014-01-29 | Disposition: A | Discharge status: Home / Self Care | Payer: Medicare Other | Attending: Emergency Medicine | Admitting: Emergency Medicine

## 2014-01-29 DIAGNOSIS — F17200 Nicotine dependence, unspecified, uncomplicated: Secondary | ICD-10-CM | POA: Insufficient documentation

## 2014-01-29 DIAGNOSIS — R569 Unspecified convulsions: Secondary | ICD-10-CM

## 2014-01-29 DIAGNOSIS — Z8701 Personal history of pneumonia (recurrent): Secondary | ICD-10-CM | POA: Insufficient documentation

## 2014-01-29 DIAGNOSIS — Z7982 Long term (current) use of aspirin: Secondary | ICD-10-CM | POA: Insufficient documentation

## 2014-01-29 DIAGNOSIS — Z79899 Other long term (current) drug therapy: Secondary | ICD-10-CM | POA: Insufficient documentation

## 2014-01-29 DIAGNOSIS — Z8659 Personal history of other mental and behavioral disorders: Secondary | ICD-10-CM | POA: Insufficient documentation

## 2014-01-29 DIAGNOSIS — Z8673 Personal history of transient ischemic attack (TIA), and cerebral infarction without residual deficits: Secondary | ICD-10-CM | POA: Insufficient documentation

## 2014-01-29 DIAGNOSIS — G40909 Epilepsy, unspecified, not intractable, without status epilepticus: Secondary | ICD-10-CM | POA: Insufficient documentation

## 2014-01-29 DIAGNOSIS — I10 Essential (primary) hypertension: Secondary | ICD-10-CM | POA: Insufficient documentation

## 2014-01-29 LAB — PHENYTOIN LEVEL, TOTAL: Phenytoin Lvl: 10.7 ug/mL (ref 10.0–20.0)

## 2014-01-29 MED ORDER — LEVETIRACETAM 750 MG PO TABS
1500.0000 mg | ORAL_TABLET | Freq: Once | ORAL | Status: AC
  Administered 2014-01-29: 1500 mg via ORAL
  Filled 2014-01-29: qty 2

## 2014-01-29 NOTE — ED Notes (Signed)
MD at bedside. 

## 2014-01-29 NOTE — ED Notes (Signed)
Patient arrives via EMS from [name] after a witnessed seizure by nursing home staff Per EMS, seizure lasted approximately one minute in duration Patient has right sided weakness due to previous CVA Per EMS, patient normally non-verbal, but is able to nod and shake head when answering questions

## 2014-01-29 NOTE — Discharge Instructions (Signed)
°Emergency Department Resource Guide °1) Find a Doctor and Pay Out of Pocket °Although you won't have to find out who is covered by your insurance plan, it is a good idea to ask around and get recommendations. You will then need to call the office and see if the doctor you have chosen will accept you as a new patient and what types of options they offer for patients who are self-pay. Some doctors offer discounts or will set up payment plans for their patients who do not have insurance, but you will need to ask so you aren't surprised when you get to your appointment. ° °2) Contact Your Local Health Department °Not all health departments have doctors that can see patients for sick visits, but many do, so it is worth a call to see if yours does. If you don't know where your local health department is, you can check in your phone book. The CDC also has a tool to help you locate your state's health department, and many state websites also have listings of all of their local health departments. ° °3) Find a Walk-in Clinic °If your illness is not likely to be very severe or complicated, you may want to try a walk in clinic. These are popping up all over the country in pharmacies, drugstores, and shopping centers. They're usually staffed by nurse practitioners or physician assistants that have been trained to treat common illnesses and complaints. They're usually fairly quick and inexpensive. However, if you have serious medical issues or chronic medical problems, these are probably not your best option. ° °No Primary Care Doctor: °- Call [facility] Connect at  832-8000 - they can help you locate a primary care doctor that  accepts your insurance, provides certain services, etc. °- Physician Referral Service- 1-800-533-3463 ° °Chronic Pain Problems: °Organization         Address  Phone   Notes  °[facility]  (336) 297-2271 Patients need to be referred by their primary care doctor.  ° °Medication  Assistance: °Organization         Address  Phone   Notes  °[place] [facility] [address] °[place], [place] [address] (336) 641-8030 --Must be a resident of [place] °-- Must have NO insurance coverage whatsoever (no Medicaid/ Medicare, etc.) °-- The pt. MUST have a primary care doctor that directs their care regularly and follows them in the community °  °[facility]  (866) 331-1348   °[employer]  (888) 892-1162   ° °Agencies that provide inexpensive medical care: °Organization         Address  Phone   Notes  °[facility]  (336) 832-8035   °[facility]    (336) 832-7272   °[facility] [address] °[place], [place] [address] (336) 832-4777   °[facility] [address], °[place] (336) 271-4999   °[facility]    (336) 373-0678   °[facility]    (336) 272-1050   °[facility] ° [address], [place] Phone:  (336) 832-4444, Fax:  (336) 832-4440 Hours of Operation:  9 am - 6 pm, M-F.  Also accepts Medicaid/Medicare and self-pay.  °[facility] ° [address], [place] Phone: (336) 832-3150, Fax: (336) [phone]. Hours of Operation:  8:30 am - 5:30 pm, M-F.  Also accepts Medicaid and self-pay.  °[facility] [place] [address], [place] Phone: (336) 878-6027   °[facility] [address], [place], [place] (336)723-1848, Ext. 123 Mondays & Thursdays: 7-9 AM.  First 15 patients are seen on a first come, first serve basis. °  ° °Medicaid-accepting [place] Providers: ° °Organization         Address  Phone   Notes  °[facility] [address], [place] (336) 641-2100 Also accepts self-pay patients.  °[facility] [address], [place] ° (336) 856-9996   °[facility] [address], [place]  (336) 288-8857   °[facility] [address] [place] [address], [place] (336) 299-7000   °Veita Bland [address], [place]  ° (336) 373-1557 Only accepts [place] Access Medicaid patients after they have their name applied to their card.  ° °Self-Pay (no insurance) in [place]: ° °Organization         Address  Phone   Notes  °Sickle Cell Patients, [facility] [address], [place] (336) 832-1970   °[facility] [address], [place] (336) 832-4400   °[facility] ° [address], [place] (336) 992-4800   °[facility]/Dr. Osei-Bonsu ° [address] [place] [address], [place] or [address], [place] (336) 841-8500 Phone number for both [place] and [place] locations is the same.  °[facility] [address], [place] (336) 299-0000   °[facility] [address] [place] [address], [place] or [address] (336) 852-7530 °(336) 878-2260   °[facility] [address], [place] (336) 350-1642, phone; (336) 294-5005, fax Sees patients 1st and 3rd Saturday of every month.  Must not qualify for public or private insurance (i.e. Medicaid, Medicare, [place] Health Choice, Veterans' Benefits) • Household income should be no more than 200% of the poverty level •The clinic cannot treat you if you are pregnant or think you are pregnant • Sexually transmitted diseases are not treated at the clinic.  ° ° °Dental Care: °Organization         Address  Phone  Notes  °[place] [facility] [address], [place] (336) 641-6152 Accepts children up to age 21 who are enrolled in Medicaid or [place] Health Choice; pregnant women with a Medicaid card; and children who have applied for Medicaid or [place] Health Choice, but were declined, whose parents can pay a reduced fee at time of service.  °[place]  [facility] [place]  [address], [place] (336) 641-7733 Accepts children up to age 21 who are enrolled in Medicaid or [place] Health Choice; pregnant women with a Medicaid card; and children who have applied for Medicaid or [place] Health Choice, but were declined, whose parents can pay a reduced fee at time of service.  °[facility] ° [address], [place] (336) 641-4533 Patients are seen by appointment only. Walk-ins are not accepted. [facility] will see patients 18 years of age and older. °Monday - Tuesday (8am-5pm) °Most Wednesdays (8:30-5pm) °$30 per visit, cash only  °[facility] ° [address], [place] (336) 641-4533 Patients are seen by appointment only. Walk-ins are not accepted. [facility] will see patients 18 years of age and older. °One   Wednesday Evening (Monthly: Volunteer Based).  $30 per visit, cash only  °[facility]  (919) 537-3737 for adults; Children under age 4, call Graduate Pediatric Dentistry at ([phone]) [phone]. Children aged 4-14, please call (919) [phone] to request a pediatric application. ° Dental services are provided in all areas of dental care including fillings, crowns and bridges, complete and partial dentures, implants, gum treatment, root canals, and extractions. Preventive care is also provided. Treatment is provided to both adults and children. °Patients are selected via a lottery and there is often a waiting list. °  °[facility] [address], °[place] ° (336) 763-8833 [email or web address] °  °[facility] [address], [place], [place] (336)723-1848, Ext. 123 Second and Fourth Thursday of each month, opens at 6:30 AM; Clinic ends at 9 AM.  Patients are seen on a first-come first-served basis, and a limited number are seen during each clinic.  ° °[facility] ° [address], [place], [place] (336) 723-7904    Eligibility Requirements °You must have lived in [place], [place], or [place] counties for at least the last three months. °  You cannot be eligible for state or federal sponsored healthcare insurance, including Veterans Administration, Medicaid, or Medicare. °  You generally cannot be eligible for healthcare insurance through your employer.  °  How to apply: °Eligibility screenings are held every Tuesday and Wednesday afternoon from 1:00 pm until 4:00 pm. You do not need an appointment for the interview!  °[facility] [address], [place], [place] 336-631-2330   °[facility]  336-342-8273   °[facility]  336-703-3100   °[facility]  336-570-6415   ° °Behavioral Health Resources in the Community: °Intensive Outpatient Programs °Organization         Address  Phone  Notes  °[facility] [address], [place], [place] 336-878-6098   °[facility] [address], [place], [place] 336-832-9800   °[facility] [address], [place], [place] ° 336-882-2125   °[facility] [address],  °[place], [place] 1-800-853-5163 or 336-641-4981   °Substance Abuse Resources °Organization         Address  Phone  Notes  °[facility]  336-882-2125   °[facility]  336-784-9470   °[facility]  336-285-9073   °[facility]  336-845-3988   °Residential & Outpatient Substance Abuse Program  1-800-659-3381   °Psychological Services °Organization         Address  Phone  Notes  °[facility]  336- 832-9600   °[facility]  336- 378-7881   °[facility] [address], [place] 1-800-853-5163 or 336-641-4981   ° °Mobile Crisis Teams °Organization         Address  Phone  Notes  °[facility], Mobile Crisis Care Unit  1-877-626-1772   °Assertive °[facility] ° [address]  [place], [place] 336-834-9664   °Sharon DeEsch [address] °[place] 336-554-5454   ° °Self-Help/Support Groups °Organization         Address  Phone             Notes  °[facility]  - variety of support groups  336- 373-1402 Call for more information  °Narcotics Anonymous (NA), [facility] [address], °[place]  2 meetings at this location  ° °  Residential Treatment Programs Organization         Address  Phone  Notes  [facility] [address],    [place]  [phone]   [facility]  [address], [place] [phone], [place], [place] [phone]   [facility] [address], [place] [phone] Admissions: 8am-3pm M-F  [facility] [address]    [place], [place] [phone]   [facility] [address], [place], [place] [phone]   [facility] [address]  [place], [place] [phone]   [facility] Intensive Outpatient [address], [place], [place] [phone]   [facility] ([facility] [address]  [place], [place] [phone] or [phone]   [facility] ([facility]) [address] [place], [place] [phone] Accepts Medicaid  [facility] [address]  [place] [phone] Substance Abuse/Addiction Treatment   [facility]         Address  Phone  Notes  [facility]  (224)[phone](888) [phone]   Angie FavaJulie Brannon, PhD [address] [place], [place]   ([phone]) [phone](336) [phone] or [phone](336) [phone]   [facility]   [address] [place], [place] [phone](336) [phone]   [facility] [address], [place], [place] ([phone]) [phone](336) [phone] Insurance/Medicaid/sponsorship through [facility] [address]                                    [place], [place] [phone](336) [phone] Therapy/tele-psych/case    [facility] [address]   [place], [place] [phone]) [phone]    Dr. Lolly MustacheArfeen  [phone](336) [phone]   [facility]  [employer] [facility] 1) [address], [place] 2) [address], [place] 3)  [address], [place] [phone]) [phone](336) [phone]  [phone]) [phone]   [facility] Child Abuse Hotline (513) 144-8824(336) [phone] or [phone](336) [phone] (After Hours)       Take your usual prescriptions as previously directed. You received your Keppra dose while you were in the Emergency Department today. Call your regular Neurologist today to schedule a follow up appointment within the next 3 days.  Return to the Emergency Department immediately sooner if worsening.

## 2014-01-29 NOTE — ED Notes (Signed)
Bed: WA10 Expected date:  Expected time:  Means of arrival:  Comments: EMS-seizure 

## 2014-01-29 NOTE — ED Provider Notes (Signed)
CSN: [id number]     Arrival date & time 01/29/14  0703 History   First MD Initiated Contact with Patient 01/29/14 0710     Chief Complaint  Patient presents with  . Seizures      HPI Pt was seen at 0710. Per EMS, NH report and pt, c/o sudden onset and resolution of one episode of seizure that occurred PTA. NH states seizure lasted approximately 1 minute before spontaneously resolving. EMS states pt's baseline is right hemiparesis and minimal speech due to previous CVA. Pt feels "tired" currently, otherwise denies complaints. Pt does not believe he received his usual seizure meds (keppra, dilantin) this morning. Denies fevers, no falls, no CP/SOB, no abd pain, no N/V/D, no new focal motor weakness.   Neuro: NP Lam Past Medical History  Diagnosis Date  . Hypertension   . SDH (subdural hematoma)   . CVA (cerebral infarction)   . History of craniotomy   . Seizures   . Organic brain syndrome   . Dilantin toxicity   . Right hemiparesis   . Pneumonia   . Polysubstance abuse    Past Surgical History  Procedure Laterality Date  . Craniotomy      following head trauma   Family History  Problem Relation Age of Onset  . Heart attack Mother   . Heart attack Father    History  Substance Use Topics  . Smoking status: Current Every Day Smoker -- 1.00 packs/day  . Smokeless tobacco: Never Used  . Alcohol Use: No    Review of Systems ROS: Statement: All systems negative except as marked or noted in the HPI; Constitutional: Negative for fever and chills. ; ; Eyes: Negative for eye pain, redness and discharge. ; ; ENMT: Negative for ear pain, hoarseness, nasal congestion, sinus pressure and sore throat. ; ; Cardiovascular: Negative for chest pain, palpitations, diaphoresis, dyspnea and peripheral edema. ; ; Respiratory: Negative for cough, wheezing and stridor. ; ; Gastrointestinal: Negative for nausea, vomiting, diarrhea, abdominal pain, blood in stool, hematemesis, jaundice and rectal  bleeding. . ; ; Genitourinary: Negative for dysuria, flank pain and hematuria. ; ; Musculoskeletal: Negative for back pain and neck pain. Negative for swelling and trauma.; ; Skin: Negative for pruritus, rash, abrasions, blisters, bruising and skin lesion.; ; Neuro: Negative for headache, lightheadedness and neck stiffness. Negative for weakness, extremity weakness, paresthesias, involuntary movement, +seizure.    Allergies  Carbamazepine and Tricyclic antidepressants  Home Medications   Prior to Admission medications   Medication Sig Start Date End Date Taking? Authorizing Provider  acetaminophen (TYLENOL) 325 MG tablet Take 650 mg by mouth daily. At 8am   Yes Historical Provider, MD  aspirin EC 81 MG tablet Take 81 mg by mouth daily.   Yes Historical Provider, MD  Cholecalciferol (VITAMIN D3) 2000 UNITS TABS Take 4,000 Units by mouth daily.   Yes Historical Provider, MD  DULoxetine (CYMBALTA) 30 MG capsule Take 30 mg by mouth daily.   Yes Historical Provider, MD  ferrous sulfate 325 (65 FE) MG tablet Take 325 mg by mouth 2 (two) times daily.    Yes Historical Provider, MD  gabapentin (NEURONTIN) 600 MG tablet Take 600 mg by mouth 2 (two) times daily.   Yes Historical Provider, MD  latanoprost (XALATAN) 0.005 % ophthalmic solution Place 1 drop into both eyes at bedtime.   Yes Historical Provider, MD  levETIRAcetam (KEPPRA) 750 MG tablet Take 1,500 mg by mouth daily.   Yes Historical Provider, MD  lisinopril (PRINIVIL,ZESTRIL) 20  MG tablet Take 20 mg by mouth daily.   Yes Historical Provider, MD  Multiple Vitamins-Iron (MULTIVITAMINS WITH IRON) TABS Take 1 tablet by mouth daily.   Yes Historical Provider, MD  Nutritional Supplements (NUTRITIONAL SHAKE PO) Take 1 each by mouth 3 (three) times daily with meals. MIGHTY SHAKES   Yes Historical Provider, MD  ondansetron (ZOFRAN) 4 MG tablet Take 4 mg by mouth every 8 (eight) hours as needed for nausea or vomiting.   Yes Historical Provider, MD   phenytoin (DILANTIN) 100 MG ER capsule Take 200 mg by mouth every evening.   Yes Historical Provider, MD  vitamin C (ASCORBIC ACID) 500 MG tablet Take 500 mg by mouth 2 (two) times daily.   Yes Historical Provider, MD   BP 136/80  Pulse 85  Temp(Src) 97.1 F (36.2 C) (Oral)  Resp 17  SpO2 97% Physical Exam 0715: Physical examination:  Nursing notes reviewed; Vital signs and O2 SAT reviewed;  Constitutional: Well developed, Well nourished, Well hydrated, In no acute distress; Head:  Normocephalic, atraumatic; Eyes: EOMI, PERRL, No scleral icterus; ENMT: Mouth and pharynx normal, Mucous membranes moist; Neck: Supple, Full range of motion, No lymphadenopathy; Cardiovascular: Regular rate and rhythm, No gallop; Respiratory: Breath sounds clear & equal bilaterally, No wheezes. Normal respiratory effort/excursion; Chest: Nontender, Movement normal; Abdomen: Soft, Nontender, Nondistended, Normal bowel sounds; Genitourinary: No CVA tenderness; Extremities: Pulses normal, No tenderness, No edema, No calf edema or asymmetry.; Neuro: Awake, alert, nods head yes/no to questions. Right facial droop and RUE/RLE hemiparesis per hx. Moves LUE and LLE spontaneously and to command.; Skin: Color normal, Warm, Dry.   ED Course  Procedures     EKG Interpretation None      MDM  MDM Reviewed: previous chart, nursing note and vitals Reviewed previous: labs Interpretation: labs    Results for orders placed during the hospital encounter of 01/29/14  PHENYTOIN LEVEL, TOTAL      Result Value Ref Range   Phenytoin Lvl 10.7  10.0 - 20.0 ug/mL     1035:  Dilantin is within therapeutic level, will not add extra dose while in the ED now, d/t pt having significant hx of dilantin toxicity. Will dose pt's usual PO keppra while in the ED today. Pt's VS remain stable, nods head yes/no to questions appropriately. Wants to go back to NH now. Dx and testing d/w pt.  Questions answered.  Verb understanding, agreeable  to d/c back to NH with outpt f/u.     Laray AngerKathleen M McManus, DO 01/30/14 2158

## 2014-02-12 ENCOUNTER — Emergency Department (HOSPITAL_COMMUNITY): Payer: Medicare Other

## 2014-02-12 ENCOUNTER — Emergency Department (HOSPITAL_COMMUNITY)
Admission: EM | Admit: 2014-02-12 | Discharge: 2014-02-12 | Disposition: A | Discharge status: Home / Self Care | Payer: Medicare Other | Attending: Emergency Medicine | Admitting: Emergency Medicine

## 2014-02-12 ENCOUNTER — Encounter (HOSPITAL_COMMUNITY): Payer: Self-pay | Admitting: Emergency Medicine

## 2014-02-12 DIAGNOSIS — S0990XA Unspecified injury of head, initial encounter: Secondary | ICD-10-CM | POA: Insufficient documentation

## 2014-02-12 DIAGNOSIS — N390 Urinary tract infection, site not specified: Secondary | ICD-10-CM | POA: Insufficient documentation

## 2014-02-12 DIAGNOSIS — I10 Essential (primary) hypertension: Secondary | ICD-10-CM | POA: Insufficient documentation

## 2014-02-12 DIAGNOSIS — Z792 Long term (current) use of antibiotics: Secondary | ICD-10-CM | POA: Insufficient documentation

## 2014-02-12 DIAGNOSIS — Z7982 Long term (current) use of aspirin: Secondary | ICD-10-CM | POA: Insufficient documentation

## 2014-02-12 DIAGNOSIS — S0180XA Unspecified open wound of other part of head, initial encounter: Secondary | ICD-10-CM | POA: Insufficient documentation

## 2014-02-12 DIAGNOSIS — S0181XA Laceration without foreign body of other part of head, initial encounter: Secondary | ICD-10-CM

## 2014-02-12 DIAGNOSIS — Z8669 Personal history of other diseases of the nervous system and sense organs: Secondary | ICD-10-CM | POA: Insufficient documentation

## 2014-02-12 DIAGNOSIS — G40909 Epilepsy, unspecified, not intractable, without status epilepticus: Secondary | ICD-10-CM | POA: Insufficient documentation

## 2014-02-12 DIAGNOSIS — W050XXA Fall from non-moving wheelchair, initial encounter: Secondary | ICD-10-CM | POA: Insufficient documentation

## 2014-02-12 DIAGNOSIS — F17200 Nicotine dependence, unspecified, uncomplicated: Secondary | ICD-10-CM | POA: Insufficient documentation

## 2014-02-12 DIAGNOSIS — Y9389 Activity, other specified: Secondary | ICD-10-CM | POA: Insufficient documentation

## 2014-02-12 DIAGNOSIS — Z9889 Other specified postprocedural states: Secondary | ICD-10-CM | POA: Insufficient documentation

## 2014-02-12 DIAGNOSIS — Y9210 Unspecified residential institution as the place of occurrence of the external cause: Secondary | ICD-10-CM | POA: Insufficient documentation

## 2014-02-12 DIAGNOSIS — Z23 Encounter for immunization: Secondary | ICD-10-CM | POA: Insufficient documentation

## 2014-02-12 DIAGNOSIS — Z87820 Personal history of traumatic brain injury: Secondary | ICD-10-CM | POA: Insufficient documentation

## 2014-02-12 DIAGNOSIS — Z8701 Personal history of pneumonia (recurrent): Secondary | ICD-10-CM | POA: Insufficient documentation

## 2014-02-12 DIAGNOSIS — Z79899 Other long term (current) drug therapy: Secondary | ICD-10-CM | POA: Insufficient documentation

## 2014-02-12 DIAGNOSIS — I69959 Hemiplegia and hemiparesis following unspecified cerebrovascular disease affecting unspecified side: Secondary | ICD-10-CM | POA: Insufficient documentation

## 2014-02-12 DIAGNOSIS — Z8659 Personal history of other mental and behavioral disorders: Secondary | ICD-10-CM | POA: Insufficient documentation

## 2014-02-12 DIAGNOSIS — F079 Unspecified personality and behavioral disorder due to known physiological condition: Secondary | ICD-10-CM | POA: Insufficient documentation

## 2014-02-12 LAB — BASIC METABOLIC PANEL
Anion gap: 10 (ref 5–15)
BUN: 20 mg/dL (ref 6–23)
CALCIUM: 9.3 mg/dL (ref 8.4–10.5)
CO2: 27 mEq/L (ref 19–32)
Chloride: 102 mEq/L (ref 96–112)
Creatinine, Ser: 0.86 mg/dL (ref 0.50–1.35)
GFR, EST NON AFRICAN AMERICAN: 88 mL/min — AB (ref >90)
Glucose, Bld: 84 mg/dL (ref 70–99)
Potassium: 4.5 mEq/L (ref 3.7–5.3)
Sodium: 139 mEq/L (ref 137–147)

## 2014-02-12 LAB — CBC WITH DIFFERENTIAL/PLATELET
Basophils Absolute: 0 10*3/uL (ref 0.0–0.1)
Basophils Relative: 0 % (ref 0–1)
EOS ABS: 0.2 10*3/uL (ref 0.0–0.7)
Eosinophils Relative: 3 % (ref 0–5)
HEMATOCRIT: 31.3 % — AB (ref 39.0–52.0)
HEMOGLOBIN: 10.4 g/dL — AB (ref 13.0–17.0)
Lymphocytes Relative: 31 % (ref 12–46)
Lymphs Abs: 1.7 10*3/uL (ref 0.7–4.0)
MCH: 23.6 pg — AB (ref 26.0–34.0)
MCHC: 33.2 g/dL (ref 30.0–36.0)
MCV: 71 fL — AB (ref 78.0–100.0)
MONO ABS: 0.8 10*3/uL (ref 0.1–1.0)
MONOS PCT: 15 % — AB (ref 3–12)
NEUTROS PCT: 51 % (ref 43–77)
Neutro Abs: 2.9 10*3/uL (ref 1.7–7.7)
Platelets: 294 10*3/uL (ref 150–400)
RBC: 4.41 MIL/uL (ref 4.22–5.81)
RDW: 15.5 % (ref 11.5–15.5)
WBC: 5.6 10*3/uL (ref 4.0–10.5)

## 2014-02-12 LAB — URINALYSIS, ROUTINE W REFLEX MICROSCOPIC
Bilirubin Urine: NEGATIVE
Glucose, UA: NEGATIVE mg/dL
Hgb urine dipstick: NEGATIVE
Ketones, ur: NEGATIVE mg/dL
Nitrite: NEGATIVE
Protein, ur: NEGATIVE mg/dL
Specific Gravity, Urine: 1.023 (ref 1.005–1.030)
UROBILINOGEN UA: 1 mg/dL (ref 0.0–1.0)
pH: 6 (ref 5.0–8.0)

## 2014-02-12 LAB — URINE MICROSCOPIC-ADD ON

## 2014-02-12 MED ORDER — TETANUS-DIPHTH-ACELL PERTUSSIS 5-2.5-18.5 LF-MCG/0.5 IM SUSP
0.5000 mL | Freq: Once | INTRAMUSCULAR | Status: AC
  Administered 2014-02-12: 0.5 mL via INTRAMUSCULAR
  Filled 2014-02-12: qty 0.5

## 2014-02-12 MED ORDER — CEPHALEXIN 500 MG PO CAPS: 500.0000 mg | ORAL_CAPSULE | Freq: Four times a day (QID) | ORAL | Status: DC

## 2014-02-12 MED ORDER — DEXTROSE 5 % IV SOLN
1.0000 g | Freq: Once | INTRAVENOUS | Status: AC
  Administered 2014-02-12: 1 g via INTRAVENOUS
  Filled 2014-02-12: qty 10

## 2014-02-12 NOTE — ED Notes (Signed)
Bed: WA13 Expected date:  Expected time:  Means of arrival:  Comments: EMS-fall 

## 2014-02-12 NOTE — Discharge Instructions (Signed)
Urinary Tract Infection A urinary tract infection (UTI) can occur any place along the urinary tract. The tract includes the kidneys, ureters, bladder, and urethra. A type of germ called bacteria often causes a UTI. UTIs are often helped with antibiotic medicine.  HOME CARE   If given, take antibiotics as told by your doctor. Finish them even if you start to feel better.  Drink enough fluids to keep your pee (urine) clear or pale yellow.  Avoid tea, drinks with caffeine, and bubbly (carbonated) drinks.  Pee often. Avoid holding your pee in for a long time.  Pee before and after having sex (intercourse).  Wipe from front to back after you poop (bowel movement) if you are a woman. Use each tissue only once. GET HELP RIGHT AWAY IF:   You have back pain.  You have lower belly (abdominal) pain.  You have chills.  You feel sick to your stomach (nauseous).  You throw up (vomit).  Your burning or discomfort with peeing does not go away.  You have a fever.  Your symptoms are not better in 3 days. MAKE SURE YOU:   Understand these instructions.  Will watch your condition.  Will get help right away if you are not doing well or get worse. Document Released: 01/09/2008 Document Revised: 04/16/2012 Document Reviewed: 02/21/2012 [facility] Patient Information 2015 [place]. This information is not intended to replace advice given to you by your health care provider. Make sure you discuss any questions you have with your health care provider. Tissue Adhesive Wound Care Some cuts, wounds, lacerations, and incisions can be repaired by using tissue adhesive. Tissue adhesive is like glue. It holds the skin together, allowing for faster healing. It forms a strong bond on the skin in about 1 minute and reaches its full strength in about 2 or 3 minutes. The adhesive disappears naturally while the wound is healing. It is important to take proper care of your wound at home while it heals.  HOME  CARE INSTRUCTIONS   Showers are allowed. Do not soak the area containing the tissue adhesive. Do not take baths, swim, or use hot tubs. Do not use any soaps or ointments on the wound. Certain ointments can weaken the glue.  If a bandage (dressing) has been applied, follow your health care provider's instructions for how often to change the dressing.   Keep the dressing dry if one has been applied.   Do not scratch, pick, or rub the adhesive.   Do not place tape over the adhesive. The adhesive could come off when pulling the tape off.   Protect the wound from further injury until it is healed.   Protect the wound from sun and tanning bed exposure while it is healing and for several weeks after healing.   Only take over-the-counter or prescription medicines as directed by your health care provider.   Keep all follow-up appointments as directed by your health care provider. SEEK IMMEDIATE MEDICAL CARE IF:   Your wound becomes red, swollen, hot, or tender.   You develop a rash after the glue is applied.  You have increasing pain in the wound.   You have a red streak that goes away from the wound.   You have pus coming from the wound.   You have increased bleeding.  You have a fever.  You have shaking chills.   You notice a bad smell coming from the wound.   Your wound or adhesive breaks open.  MAKE SURE YOU:  Understand these instructions.  Will watch your condition.  Will get help right away if you are not doing well or get worse. Document Released: 01/16/2001 Document Revised: 05/13/2013 Document Reviewed: 02/11/2013 [facility] Patient Information 2015 [place], [place]. This information is not intended to replace advice given to you by your health care provider. Make sure you discuss any questions you have with your health care provider.

## 2014-02-12 NOTE — ED Notes (Signed)
Pt's left forehead laceration cleaned and irrigated with sterile NS. Pt tolerated well.

## 2014-02-12 NOTE — Progress Notes (Signed)
  CARE MANAGEMENT ED NOTE 02/12/2014  Patient:  [name],[name]   Account Number:  [id number]  Date Initiated:  02/12/2014  Documentation initiated by:  Radford PaxFERRERO,Saydi Kobel  Subjective/Objective Assessment:   Patient presents to Ed with patient woke up from sleeping and fell onto the floor sustaining laceration to left face     Subjective/Objective Assessment Detail:   Patient with pmhx of HTN, CVA, seizures, right hemiparesis.     Action/Plan:   Action/Plan Detail:   Anticipated DC Date:       Status Recommendation to Physician:   Result of Recommendation:    Other ED Services  Consult Working Plan    DC Planning Services  Other  PCP issues    Choice offered to / List presented to:            Status of service:  Completed, signed off  ED Comments:   ED Comments Detail:  EDCM spoke to patient at bedside.  Patient confirms he does not have a pcp.  EDCM provided a list of pcps who accept Medicare insurnace within a five mile radius of patient's zip code [phone].  EDCM left resources at bedside as patient was in testing.  No further EDCM needs at this time.

## 2014-02-12 NOTE — ED Notes (Addendum)
Spoke to KemptonHarun, Charity fundraiserN at [facility] and updated him on patient's Keflex abx regimen and plan of care. Acknowledges understanding.  Changed pt's clothes due to incontinence.

## 2014-02-12 NOTE — ED Notes (Signed)
MD at bedside. 

## 2014-02-12 NOTE — ED Notes (Addendum)
Per EMS- pt woke up from sleeping, sat up in bed and fell out of bed onto floor. Left facial laceration-no active bleeding. Pt says this is new laceration. A&Ox4. Called out b/c patient "wasn't acting normal." Hx stroke, seizure, HTN. No abnormal deficit noted. Baseline right sided weakness. C/o neck pain. Placed in c-collar. Hx neck pain. VS: BP 164/84 SpO2 98% RR 16 HR 80 CBG 110.

## 2014-02-12 NOTE — ED Provider Notes (Signed)
CSN: [id number]     Arrival date & time 02/12/14  1451 History   First MD Initiated Contact with Patient 02/12/14 1536     Chief Complaint  Patient presents with  . 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 The patient's resident of a nursing facility. Patient states today he he fell out of a wheelchair and slipped onto the floor. Patient sustained a small laceration to the side of his face. EMS was contacted. According to the nursing facility wasn't acting normally. Patient denies any complaints here other than some neck pain and headache after this fall. He's not having trouble with any chest pain or shortness of breath. Denies any vomiting or diarrhea. Denies any issues with effusions. He denies any recent seizures. Past Medical History  Diagnosis Date  . Hypertension   . SDH (subdural hematoma)   . CVA (cerebral infarction)   . History of craniotomy   . Seizures   . Organic brain syndrome   . Dilantin toxicity   . Right hemiparesis   . Pneumonia   . Polysubstance abuse    Past Surgical History  Procedure Laterality Date  . Craniotomy      following head trauma   Family History  Problem Relation Age of Onset  . Heart attack Mother   . Heart attack Father    History  Substance Use Topics  . Smoking status: Current Every Day Smoker -- 1.00 packs/day  . Smokeless tobacco: Never Used  . Alcohol Use: No    Review of Systems  All other systems reviewed and are negative.     Allergies  Carbamazepine and Tricyclic antidepressants  Home Medications   Prior to Admission medications   Medication Sig Start Date End Date Taking? Authorizing Provider  acetaminophen (TYLENOL ARTHRITIS PAIN) 650 MG CR tablet Take 650 mg by mouth every 8 (eight) hours as needed for pain.   Yes Historical Provider, MD  acetaminophen (TYLENOL) 325 MG tablet Take 650 mg by mouth daily. At 8am   Yes Historical Provider, MD   aspirin EC 81 MG tablet Take 81 mg by mouth daily.   Yes Historical Provider, MD  Cholecalciferol (VITAMIN D3) 2000 UNITS TABS Take 4,000 Units by mouth daily.   Yes Historical Provider, MD  DULoxetine (CYMBALTA) 30 MG capsule Take 30 mg by mouth daily.   Yes Historical Provider, MD  ferrous sulfate 325 (65 FE) MG tablet Take 325 mg by mouth 2 (two) times daily.    Yes Historical Provider, MD  gabapentin (NEURONTIN) 600 MG tablet Take 600 mg by mouth 2 (two) times daily.   Yes Historical Provider, MD  latanoprost (XALATAN) 0.005 % ophthalmic solution Place 1 drop into both eyes at bedtime.   Yes Historical Provider, MD  levETIRAcetam (KEPPRA) 750 MG tablet Take 1,500 mg by mouth daily.   Yes Historical Provider, MD  lisinopril (PRINIVIL,ZESTRIL) 20 MG tablet Take 20 mg by mouth daily.   Yes Historical Provider, MD  Multiple Vitamins-Iron (MULTIVITAMINS WITH IRON) TABS Take 1 tablet by mouth daily.   Yes Historical Provider, MD  Nutritional Supplements (NUTRITIONAL SHAKE PO) Take 1 each by mouth 3 (three) times daily with meals. MIGHTY SHAKES   Yes Historical Provider, MD  ondansetron (ZOFRAN) 4 MG tablet Take 4 mg by mouth every 8 (eight) hours as needed for nausea or vomiting.   Yes Historical Provider, MD  phenytoin (DILANTIN) 100 MG ER capsule Take 200 mg  by mouth every evening.   Yes Historical Provider, MD  vitamin C (ASCORBIC ACID) 500 MG tablet Take 500 mg by mouth 2 (two) times daily.   Yes Historical Provider, MD  cephALEXin (KEFLEX) 500 MG capsule Take 1 capsule (500 mg total) by mouth 4 (four) times daily. 02/12/14   Linwood Dibbles, MD   BP 118/70  Pulse 61  Temp(Src) 98.3 F (36.8 C) (Oral)  Resp 16  SpO2 100% Physical Exam  Nursing note and vitals reviewed. Constitutional: He appears well-developed and well-nourished. No distress.  HENT:  Head: Normocephalic.  Right Ear: External ear normal.  Left Ear: External ear normal.  Small laceration above left eyebrow, not through the  dermis   Eyes: Conjunctivae are normal. Right eye exhibits no discharge. Left eye exhibits no discharge. No scleral icterus.  Neck: Neck supple. Spinous process tenderness present. No tracheal deviation present.  Cardiovascular: Normal rate, regular rhythm and intact distal pulses.   Pulmonary/Chest: Effort normal and breath sounds normal. No stridor. No respiratory distress. He has no wheezes. He has no rales.  Abdominal: Soft. Bowel sounds are normal. He exhibits no distension. There is no tenderness. There is no rebound and no guarding.  Musculoskeletal: He exhibits no edema and no tenderness.       Thoracic back: Normal.       Lumbar back: Normal.  Neurological: He is alert. He has normal strength. No cranial nerve deficit (no facial droop, extraocular movements intact, no slurred speech) or sensory deficit. He exhibits normal muscle tone. He displays no seizure activity. Coordination normal.  Partial right hemiparesis  Skin: Skin is warm and dry. No rash noted.  Psychiatric: He has a normal mood and affect.    ED Course  LACERATION REPAIR Date/Time: 02/12/2014 5:37 PM Performed by: Linwood Dibbles Authorized by: Linwood Dibbles Consent: Verbal consent obtained. Risks and benefits: risks, benefits and alternatives were discussed Consent given by: patient Time out: Immediately prior to procedure a "time out" was called to verify the correct patient, procedure, equipment, support staff and site/side marked as required. Body area: head/neck (lateral to left eyebrow) Laceration length: 1 cm Foreign bodies: no foreign bodies Tendon involvement: none Nerve involvement: none Vascular damage: no Patient sedated: no Irrigation solution: saline Amount of cleaning: standard Debridement: none Degree of undermining: none Skin closure: glue Patient tolerance: Patient tolerated the procedure well with no immediate complications.   (including critical care time) Labs Review Labs Reviewed  CBC WITH  DIFFERENTIAL - Abnormal; Notable for the following:    Hemoglobin 10.4 (*)    HCT 31.3 (*)    MCV 71.0 (*)    MCH 23.6 (*)    Monocytes Relative 15 (*)    All other components within normal limits  BASIC METABOLIC PANEL - Abnormal; Notable for the following:    GFR calc non Af Amer 88 (*)    All other components within normal limits  URINALYSIS, ROUTINE W REFLEX MICROSCOPIC - Abnormal; Notable for the following:    APPearance CLOUDY (*)    Leukocytes, UA LARGE (*)    All other components within normal limits  URINE MICROSCOPIC-ADD ON - Abnormal; Notable for the following:    Bacteria, UA MANY (*)    All other components within normal limits    Imaging Review Ct Head Wo Contrast  02/12/2014   CLINICAL DATA:  Fall  EXAM: CT HEAD WITHOUT CONTRAST  CT CERVICAL SPINE WITHOUT CONTRAST  TECHNIQUE: Multidetector CT imaging of the head and cervical spine  was performed following the standard protocol without intravenous contrast. Multiplanar CT image reconstructions of the cervical spine were also generated.  COMPARISON:  05/12/2013  FINDINGS: CT HEAD FINDINGS  Right frontal craniotomy defect with skull plate is stable. No obvious mass effect, midline shift, or acute intracranial hemorrhage. Advanced cerebellar atrophy. Mild chronic ischemic changes in the bilateral basal ganglia. Mild supratentorial atrophy. Mild chronic ischemic changes in the periventricular white matter. Mastoid air cells are clear. Minimal mucus material in the left ethmoid air cells.  CT CERVICAL SPINE FINDINGS  No acute fracture. No dislocation. Severe narrowing of the C5-6, C6-7, and C7-T1 discs with posterior osteophytic ridging. Moderate narrowing of the C2-3, C3-4, and C4-5 discs. Varying degrees of foraminal narrowing throughout the cervical spine. There is severe narrowing on the left at C3-4 and C4-5. There is right-sided facet arthropathy at C3-4. Left-sided facet arthropathy at C3-4, C4-5, and C5-6.  IMPRESSION: No acute  intracranial pathology.  No acute cervical spine injury.   Electronically Signed   By: Maryclare Bean [name]   On: 02/12/2014 16:34   Ct Cervical Spine Wo Contrast  02/12/2014   CLINICAL DATA:  Fall  EXAM: CT HEAD WITHOUT CONTRAST  CT CERVICAL SPINE WITHOUT CONTRAST  TECHNIQUE: Multidetector CT imaging of the head and cervical spine was performed following the standard protocol without intravenous contrast. Multiplanar CT image reconstructions of the cervical spine were also generated.  COMPARISON:  05/12/2013  FINDINGS: CT HEAD FINDINGS  Right frontal craniotomy defect with skull plate is stable. No obvious mass effect, midline shift, or acute intracranial hemorrhage. Advanced cerebellar atrophy. Mild chronic ischemic changes in the bilateral basal ganglia. Mild supratentorial atrophy. Mild chronic ischemic changes in the periventricular white matter. Mastoid air cells are clear. Minimal mucus material in the left ethmoid air cells.  CT CERVICAL SPINE FINDINGS  No acute fracture. No dislocation. Severe narrowing of the C5-6, C6-7, and C7-T1 discs with posterior osteophytic ridging. Moderate narrowing of the C2-3, C3-4, and C4-5 discs. Varying degrees of foraminal narrowing throughout the cervical spine. There is severe narrowing on the left at C3-4 and C4-5. There is right-sided facet arthropathy at C3-4. Left-sided facet arthropathy at C3-4, C4-5, and C5-6.  IMPRESSION: No acute intracranial pathology.  No acute cervical spine injury.   Electronically Signed   By: Maryclare Bean [name]   On: 02/12/2014 16:34   Medications  cefTRIAXone (ROCEPHIN) 1 g in dextrose 5 % 50 mL IVPB (not administered)  Tdap (BOOSTRIX) injection 0.5 mL (0.5 mLs Intramuscular Given 02/12/14 1638)     MDM   Final diagnoses:  Facial laceration, initial encounter  UTI (lower urinary tract infection)    No sign of serious injury.  Pt does have a uti.  Will treat with keflex.  Tissue adhesive used for laceration repeair.  Safe for dc  back to nursing facility.    Linwood Dibbles, MD 02/12/14 1739

## 2014-03-12 ENCOUNTER — Encounter (HOSPITAL_COMMUNITY): Payer: Self-pay | Admitting: Emergency Medicine

## 2014-03-12 ENCOUNTER — Emergency Department (HOSPITAL_COMMUNITY)
Admission: EM | Admit: 2014-03-12 | Discharge: 2014-03-12 | Disposition: A | Discharge status: Home / Self Care | Payer: Medicare Other | Attending: Emergency Medicine | Admitting: Emergency Medicine

## 2014-03-12 DIAGNOSIS — Z7982 Long term (current) use of aspirin: Secondary | ICD-10-CM | POA: Insufficient documentation

## 2014-03-12 DIAGNOSIS — W010XXA Fall on same level from slipping, tripping and stumbling without subsequent striking against object, initial encounter: Secondary | ICD-10-CM | POA: Diagnosis not present

## 2014-03-12 DIAGNOSIS — I10 Essential (primary) hypertension: Secondary | ICD-10-CM | POA: Diagnosis not present

## 2014-03-12 DIAGNOSIS — Y929 Unspecified place or not applicable: Secondary | ICD-10-CM | POA: Insufficient documentation

## 2014-03-12 DIAGNOSIS — G40909 Epilepsy, unspecified, not intractable, without status epilepticus: Secondary | ICD-10-CM | POA: Insufficient documentation

## 2014-03-12 DIAGNOSIS — F17200 Nicotine dependence, unspecified, uncomplicated: Secondary | ICD-10-CM | POA: Insufficient documentation

## 2014-03-12 DIAGNOSIS — Y9389 Activity, other specified: Secondary | ICD-10-CM | POA: Insufficient documentation

## 2014-03-12 DIAGNOSIS — Z9889 Other specified postprocedural states: Secondary | ICD-10-CM | POA: Insufficient documentation

## 2014-03-12 DIAGNOSIS — S0180XA Unspecified open wound of other part of head, initial encounter: Secondary | ICD-10-CM | POA: Insufficient documentation

## 2014-03-12 DIAGNOSIS — S0181XA Laceration without foreign body of other part of head, initial encounter: Secondary | ICD-10-CM

## 2014-03-12 DIAGNOSIS — Z79899 Other long term (current) drug therapy: Secondary | ICD-10-CM | POA: Insufficient documentation

## 2014-03-12 DIAGNOSIS — Z8701 Personal history of pneumonia (recurrent): Secondary | ICD-10-CM | POA: Diagnosis not present

## 2014-03-12 NOTE — ED Notes (Addendum)
Pt presents via EMS from [facility] with unwitnessed fall. Pt denies LOC, dizziness, N/V. Pt states that he tripped over something on the way to the restroom. Pt a/o x 4. NAD noted. Pt does not take anticoagulants. Pt denies any neck or back pain. Small laceration noted above right eye. Bleeding controlled. NAD noted at this time. Pt at baseline per EMS. Pt has hx of previous stroke with right sided deficits.

## 2014-03-12 NOTE — ED Provider Notes (Signed)
CSN: [id number]     Arrival date & time 03/12/14  1327 History   First MD Initiated Contact with Patient 03/12/14 1336     Chief Complaint  Patient presents with  . Fall    HPI Patient presents from the nursing facility after a fall and injury to his right forehead.  He presents with a laceration of his right supraorbital or M.  No use of anticoagulants.  No loss consciousness.  Patient denies neck pain.  History of seizures.  Compliant with medications.  No reports of tongue biting or urinary incontinence.  Patient has no complaints at this time.   Past Medical History  Diagnosis Date  . Hypertension   . SDH (subdural hematoma)   . CVA (cerebral infarction)   . History of craniotomy   . Seizures   . Organic brain syndrome   . Dilantin toxicity   . Right hemiparesis   . Pneumonia   . Polysubstance abuse    Past Surgical History  Procedure Laterality Date  . Craniotomy      following head trauma   Family History  Problem Relation Age of Onset  . Heart attack Mother   . Heart attack Father    History  Substance Use Topics  . Smoking status: Current Every Day Smoker -- 1.00 packs/day  . Smokeless tobacco: Never Used  . Alcohol Use: No    Review of Systems  All other systems reviewed and are negative.     Allergies  Carbamazepine and Tricyclic antidepressants  Home Medications   Prior to Admission medications   Medication Sig Start Date End Date Taking? Authorizing Provider  acetaminophen (TYLENOL ARTHRITIS PAIN) 650 MG CR tablet Take 650 mg by mouth every 8 (eight) hours as needed for pain.   Yes Historical Provider, MD  acetaminophen (TYLENOL) 325 MG tablet Take 650 mg by mouth every morning.   Yes Historical Provider, MD  aspirin EC 81 MG tablet Take 81 mg by mouth daily.   Yes Historical Provider, MD  Cholecalciferol (VITAMIN D3) 2000 UNITS TABS Take 4,000 Units by mouth daily.   Yes Historical Provider, MD  DULoxetine (CYMBALTA) 30 MG capsule Take 30 mg by  mouth daily.   Yes Historical Provider, MD  ferrous sulfate 325 (65 FE) MG tablet Take 325 mg by mouth 2 (two) times daily.    Yes Historical Provider, MD  gabapentin (NEURONTIN) 600 MG tablet Take 600 mg by mouth 2 (two) times daily.   Yes Historical Provider, MD  latanoprost (XALATAN) 0.005 % ophthalmic solution Place 1 drop into both eyes at bedtime.   Yes Historical Provider, MD  levETIRAcetam (KEPPRA) 750 MG tablet Take 1,500 mg by mouth daily.   Yes Historical Provider, MD  lisinopril (PRINIVIL,ZESTRIL) 20 MG tablet Take 20 mg by mouth daily.   Yes Historical Provider, MD  Multiple Vitamins-Iron (MULTIVITAMINS WITH IRON) TABS Take 1 tablet by mouth daily.   Yes Historical Provider, MD  Nutritional Supplements (NUTRITIONAL SHAKE PO) Take 1 each by mouth 3 (three) times daily with meals. MIGHTY SHAKES   Yes Historical Provider, MD  phenytoin (DILANTIN) 100 MG ER capsule Take 200 mg by mouth every evening.   Yes Historical Provider, MD  vitamin C (ASCORBIC ACID) 500 MG tablet Take 500 mg by mouth 2 (two) times daily.   Yes Historical Provider, MD   BP 125/73  Pulse 65  Temp(Src) 98 F (36.7 C) (Oral)  Resp 13  SpO2 100% Physical Exam  Nursing note and vitals  reviewed. Constitutional: He is oriented to person, place, and time. He appears well-developed and well-nourished.  HENT:  Head: Normocephalic and atraumatic.   Small superficial laceration of the right or hole or M. Laterally.  Extraocular movements are intact.  No active bleeding.  Eyes: EOM are normal.  Neck: Normal range of motion.  Cardiovascular: Normal rate, regular rhythm, normal heart sounds and intact distal pulses.   Pulmonary/Chest: Effort normal and breath sounds normal. No respiratory distress.  Abdominal: Soft. He exhibits no distension. There is no tenderness.  Musculoskeletal: Normal range of motion.  Neurological: He is alert and oriented to person, place, and time.  Skin: Skin is warm and dry.  Psychiatric: He  has a normal mood and affect. Judgment normal.    ED Course  Procedures (including critical care time) LACERATION REPAIR Performed by: Lyanne Co Consent: Verbal consent obtained. Risks and benefits: risks, benefits and alternatives were discussed Patient identity confirmed: provided demographic data Time out performed prior to procedure Prepped and Draped in normal sterile fashion Wound explored Laceration Location: Right orbital rim Laceration Length: 1.5 cm No Foreign Bodies seen or palpated Anesthesia: none Irrigation method: syringe Amount of cleaning: standard Skin closure: Dermabond  Number of sutures or staples: Dermabond  Technique: Dermabond  Patient tolerance: Patient tolerated the procedure well with no immediate complications.   Labs Review Labs Reviewed - No data to display  Imaging Review No results found.   EKG Interpretation None      MDM   Final diagnoses:  Facial laceration, initial encounter    And normal neuro exam.  No anticoagulants.  Laceration repaired.  Infection and head injury warnings given.    Lyanne Co, MD 03/12/14 [phone]

## 2014-03-12 NOTE — Discharge Instructions (Signed)
Tissue Adhesive Wound Care °Some cuts, wounds, lacerations, and incisions can be repaired by using tissue adhesive. Tissue adhesive is like glue. It holds the skin together, allowing for faster healing. It forms a strong bond on the skin in about 1 minute and reaches its full strength in about 2 or 3 minutes. The adhesive disappears naturally while the wound is healing. It is important to take proper care of your wound at home while it heals.  °HOME CARE INSTRUCTIONS  °· Showers are allowed. Do not soak the area containing the tissue adhesive. Do not take baths, swim, or use hot tubs. Do not use any soaps or ointments on the wound. Certain ointments can weaken the glue. °· If a bandage (dressing) has been applied, follow your health care provider's instructions for how often to change the dressing.   °· Keep the dressing dry if one has been applied.   °· Do not scratch, pick, or rub the adhesive.   °· Do not place tape over the adhesive. The adhesive could come off when pulling the tape off.   °· Protect the wound from further injury until it is healed.   °· Protect the wound from sun and tanning bed exposure while it is healing and for several weeks after healing.   °· Only take over-the-counter or prescription medicines as directed by your health care provider.   °· Keep all follow-up appointments as directed by your health care provider. °SEEK IMMEDIATE MEDICAL CARE IF:  °· Your wound becomes red, swollen, hot, or tender.   °· You develop a rash after the glue is applied. °· You have increasing pain in the wound.   °· You have a red streak that goes away from the wound.   °· You have pus coming from the wound.   °· You have increased bleeding. °· You have a fever. °· You have shaking chills.   °· You notice a bad smell coming from the wound.   °· Your wound or adhesive breaks open.   °MAKE SURE YOU:  °· Understand these instructions. °· Will watch your condition. °· Will get help right away if you are not doing  well or get worse. °Document Released: 01/16/2001 Document Revised: 05/13/2013 Document Reviewed: 02/11/2013 °[employer]® Patient Information ©2015 [employer]. This information is not intended to replace advice given to you by your health care provider. Make sure you discuss any questions you have with your health care provider. ° °

## 2014-03-12 NOTE — ED Notes (Signed)
EDP at bedside  

## 2014-05-19 ENCOUNTER — Encounter (INDEPENDENT_AMBULATORY_CARE_PROVIDER_SITE_OTHER): Payer: Self-pay

## 2014-05-19 ENCOUNTER — Encounter: Payer: Self-pay | Admitting: Neurology

## 2014-05-19 ENCOUNTER — Ambulatory Visit (INDEPENDENT_AMBULATORY_CARE_PROVIDER_SITE_OTHER): Payer: Medicare Other | Admitting: Neurology

## 2014-05-19 VITALS — BP 129/80 | HR 88 | Ht 65.0 in

## 2014-05-19 DIAGNOSIS — R269 Unspecified abnormalities of gait and mobility: Secondary | ICD-10-CM

## 2014-05-19 DIAGNOSIS — I69359 Hemiplegia and hemiparesis following cerebral infarction affecting unspecified side: Secondary | ICD-10-CM

## 2014-05-19 DIAGNOSIS — G40909 Epilepsy, unspecified, not intractable, without status epilepticus: Secondary | ICD-10-CM

## 2014-05-19 HISTORY — DX: Unspecified abnormalities of gait and mobility: R26.9

## 2014-05-19 MED ORDER — LEVETIRACETAM 1000 MG PO TABS: 1000.0000 mg | ORAL_TABLET | Freq: Two times a day (BID) | ORAL | Status: DC

## 2014-05-19 MED ORDER — ZONISAMIDE 50 MG PO CAPS: ORAL_CAPSULE | ORAL | Status: DC

## 2014-05-19 NOTE — Patient Instructions (Signed)

## 2014-05-19 NOTE — Progress Notes (Signed)
Reason for visit: Seizures  Crissie ReeseDonnie R Gresham is an 68 y.o. male  History of present illness:  [name] is a 68 year old left-handed black male with a history of cerebrovascular disease, and a right hemiparesis. The patient has intractable seizures, and he takes Keppra and Dilantin. The patient has had issues with Dilantin toxicity on 300 mg daily, and his blood levels of Dilantin are in the low therapeutic range on 200 mg daily. The patient has had multiple emergency room visits for falls and for seizures. The patient was seen on 10/28/2013 and on 01/29/2014 for seizures. The patient was seen on 12/04/2013, seventh of August 2015, and 10th of July 2015 for falls requiring a visit to the emergency room. The patient has a severe gait disorder, and he oftentimes will not ask for help to get to the bathroom and back. The patient uses a cane only for ambulation. The patient otherwise has not had any new medical issues that have come up since last seen.  Past Medical History  Diagnosis Date  . Hypertension   . SDH (subdural hematoma)   . CVA (cerebral infarction)   . History of craniotomy   . Seizures   . Organic brain syndrome   . Dilantin toxicity   . Right hemiparesis   . Pneumonia   . Polysubstance abuse   . Gait disorder 05/19/2014    Past Surgical History  Procedure Laterality Date  . Craniotomy      following head trauma    Family History  Problem Relation Age of Onset  . Heart attack Mother   . Heart attack Father     Social history:  reports that he has been smoking.  He has never used smokeless tobacco. He reports that he does not drink alcohol or use illicit drugs.    Allergies  Allergen Reactions  . Carbamazepine Other (See Comments)    Unknown on MAR  . Tricyclic Antidepressants Other (See Comments)    Unknown on MAR    Medications:  Current Outpatient Prescriptions on File Prior to Visit  Medication Sig Dispense Refill  . acetaminophen (TYLENOL)  325 MG tablet Take 650 mg by mouth every morning.      Marland Kitchen. aspirin EC 81 MG tablet Take 81 mg by mouth daily.      . Cholecalciferol (VITAMIN D3) 2000 UNITS TABS Take 4,000 Units by mouth daily.      . DULoxetine (CYMBALTA) 30 MG capsule Take 30 mg by mouth daily.      . ferrous sulfate 325 (65 FE) MG tablet Take 325 mg by mouth 2 (two) times daily.       Marland Kitchen. gabapentin (NEURONTIN) 600 MG tablet Take 600 mg by mouth 2 (two) times daily.      Marland Kitchen. latanoprost (XALATAN) 0.005 % ophthalmic solution Place 1 drop into both eyes at bedtime.      Marland Kitchen. lisinopril (PRINIVIL,ZESTRIL) 20 MG tablet Take 20 mg by mouth daily.      . Multiple Vitamins-Iron (MULTIVITAMINS WITH IRON) TABS Take 1 tablet by mouth daily.      . Nutritional Supplements (NUTRITIONAL SHAKE PO) Take 1 each by mouth 3 (three) times daily with meals. MIGHTY SHAKES      . phenytoin (DILANTIN) 100 MG ER capsule Take 200 mg by mouth every evening.      . vitamin C (ASCORBIC ACID) 500 MG tablet Take 500 mg by mouth 2 (two) times daily.       No current facility-administered  medications on file prior to visit.    ROS:  Out of a complete 14 system review of symptoms, the patient complains only of the following symptoms, and all other reviewed systems are negative.  Walking difficulty Confusion Seizures  Blood pressure 129/80, pulse 88, height 5\' 5"  (1.651 m), weight 0 lb (0 kg).  Physical Exam  General: The patient is alert and cooperative at the time of the examination.  Skin: No significant peripheral edema is noted.   Neurologic Exam  Mental status: The patient is oriented x 3.  Cranial nerves: Facial symmetry is present. Speech is mildly dysarthric. Extraocular movements are full. Visual fields are full.  Motor: The patient has good strength in the left extremities. The patient has 4/5 strength throughout the right arm, unable to lift the right arm above the head. The patient has 4+/5 strength in the right leg, increased motor  tone is noted on the right arm and right leg.  Sensory examination: Soft touch sensation is symmetric, legs, decreased on the right arm and right base relative to the left.  Coordination: The patient has good finger-nose-finger and heel-to-shin on the left side, the patient has difficulty performing on the right.  Gait and station: The patient has a very unsteady gait, with a circumduction gait with the right leg. The patient has a tendency to lean backwards, he cannot stand independently. Tandem gait was not attempted.  Reflexes: Deep tendon reflexes are symmetric.   CT head 02/12/14:  CT HEAD FINDINGS  Right frontal craniotomy defect with skull plate is stable. No  obvious mass effect, midline shift, or acute intracranial  hemorrhage. Advanced cerebellar atrophy. Mild chronic ischemic  changes in the bilateral basal ganglia. Mild supratentorial atrophy.  Mild chronic ischemic changes in the periventricular white matter.  Mastoid air cells are clear. Minimal mucus material in the left  ethmoid air cells.    Assessment/Plan:  1. Right hemiparesis  2. Gait disorder  3. Intractable epilepsy  The patient has had multiple emergency room visits for falls and for seizures. CT scan of the head has shown evidence of advanced cerebellar atrophy. Chronic use of Dilantin may produce cerebellar atrophy and worsening gait instability. The patient probably should come off of Dilantin. For this reason, the patient will be placed on Zonegran, with a slow increase in dosing. The patient will be increased on Keppra taking 1000 mg twice daily. He will return in 6 weeks, and we will consider a slow taper off of Dilantin at that time.  Marlan Palau. Keith [name] MD 05/19/2014 12:32 PM  [facility] [address] [place], [place] [id number]  Phone [phone] Fax [phone]

## 2014-06-23 ENCOUNTER — Encounter: Payer: Self-pay | Admitting: Neurology

## 2014-06-29 ENCOUNTER — Encounter: Payer: Self-pay | Admitting: Neurology

## 2014-06-30 ENCOUNTER — Ambulatory Visit: Payer: Medicare Other | Admitting: Adult Health

## 2014-06-30 ENCOUNTER — Telehealth: Payer: Self-pay | Admitting: Adult Health

## 2014-06-30 NOTE — Telephone Encounter (Signed)
Patient no showed for a revisit appointment.  

## 2014-07-05 ENCOUNTER — Encounter: Payer: Self-pay | Admitting: *Deleted

## 2014-07-15 ENCOUNTER — Ambulatory Visit (HOSPITAL_COMMUNITY): Admission: RE | Admit: 2014-07-15 | Payer: Medicare Other | Source: Ambulatory Visit | Admitting: Gastroenterology

## 2014-07-15 ENCOUNTER — Encounter (HOSPITAL_COMMUNITY): Admission: RE | Payer: Self-pay | Source: Ambulatory Visit

## 2014-07-15 SURGERY — COLONOSCOPY WITH PROPOFOL
Anesthesia: Monitor Anesthesia Care

## 2014-07-21 ENCOUNTER — Ambulatory Visit (INDEPENDENT_AMBULATORY_CARE_PROVIDER_SITE_OTHER): Payer: Medicare Other | Admitting: Adult Health

## 2014-07-21 ENCOUNTER — Encounter: Payer: Self-pay | Admitting: Adult Health

## 2014-07-21 VITALS — BP 91/58 | HR 81 | Temp 97.5°F

## 2014-07-21 DIAGNOSIS — G40909 Epilepsy, unspecified, not intractable, without status epilepticus: Secondary | ICD-10-CM

## 2014-07-21 DIAGNOSIS — Z5181 Encounter for therapeutic drug level monitoring: Secondary | ICD-10-CM

## 2014-07-21 NOTE — Patient Instructions (Signed)
Continue taking Dilantin 200 mg at bedtime.  I will check blood work today. I will call you with the results.  If you have any additional seizures please let us know.

## 2014-07-21 NOTE — Progress Notes (Signed)
I have read the note, and I agree with the clinical assessment and plan.  Arnaldo Heffron KEITH   

## 2014-07-21 NOTE — Progress Notes (Signed)
PATIENT: [name] DOB: [birth year]  REASON FOR VISIT: follow up HISTORY FROM: patient  HISTORY OF PRESENT ILLNESS: [name] is a 68 year old male with a history of cerebrovascular disease, right hemiparesis and seizures. He returns today for follow-up. The patient is currently taking Dilantin 200 mg at bedtim and Zonegran . He reports that he is not had a seizure since the last visit. The patient denies any changes in his balance or gait. He uses a cane to ambulate but most of the time will use a wheelchair.. The patient is able to complete ADLs with some assistance. The patient lives at [facility] facility.   HISTORY 05/19/14 ([name]):  68 year old left-handed black male with a history of cerebrovascular disease, and a0 right hemiparesis. The patient has intractable seizures, and he takes Keppra and Dilantin. The patient has had issues with Dilantin toxicity on 300 mg daily, and his blood levels of Dilantin are in the low therapeutic range on 200 mg daily. The patient has had multiple emergency room visits for falls and for seizures. The patient was seen on 10/28/2013 and on 01/29/2014 for seizures. The patient was seen on 12/04/2013, seventh of August 2015, and 10th of July 2015 for falls requiring a visit to the emergency room. The patient has a severe gait disorder, and he oftentimes will not ask for help to get to the bathroom and back. The patient uses a cane only for ambulation. The patient otherwise has not had any new medical issues that have come up since last seen.  REVIEW OF SYSTEMS: Out of a complete 14 system review of symptoms, the patient complains only of the following symptoms, and all other reviewed systems are negative.  Cold intolerance Cough Incontinence of bladder Walking difficulty Seizure Weakness Confusion, depression, nervous/anxious  ALLERGIES: Allergies  Allergen Reactions  . Carbamazepine Other (See Comments)    Unknown on MAR  .  Tricyclic Antidepressants Other (See Comments)    Unknown on [facility]    HOME MEDICATIONS: Outpatient Prescriptions Prior to Visit  Medication Sig Dispense Refill  . acetaminophen (TYLENOL) 325 MG tablet Take 650 mg by mouth every morning.    Marland Kitchen aspirin EC 81 MG tablet Take 81 mg by mouth daily.    . Cholecalciferol (VITAMIN D3) 2000 UNITS TABS Take 4,000 Units by mouth daily.    . DULoxetine (CYMBALTA) 30 MG capsule Take 30 mg by mouth daily.    . ferrous sulfate 325 (65 FE) MG tablet Take 325 mg by mouth 2 (two) times daily.     Marland Kitchen gabapentin (NEURONTIN) 600 MG tablet Take 600 mg by mouth 2 (two) times daily.    Marland Kitchen latanoprost (XALATAN) 0.005 % ophthalmic solution Place 1 drop into both eyes at bedtime.    . levETIRAcetam (KEPPRA) 1000 MG tablet Take 1 tablet (1,000 mg total) by mouth 2 (two) times daily. 60 tablet 3  . lisinopril (PRINIVIL,ZESTRIL) 20 MG tablet Take 20 mg by mouth daily.    . Multiple Vitamins-Iron (MULTIVITAMINS WITH IRON) TABS Take 1 tablet by mouth daily.    . Nutritional Supplements (NUTRITIONAL SHAKE PO) Take 1 each by mouth 3 (three) times daily with meals. MIGHTY SHAKES    . ondansetron (ZOFRAN) 4 MG tablet Take 4 mg by mouth every 8 (eight) hours as needed for nausea or vomiting.    . phenytoin (DILANTIN) 100 MG ER capsule Take 200 mg by mouth every evening.    . vitamin C (ASCORBIC ACID) 500 MG tablet Take  500 mg by mouth 2 (two) times daily.    Marland Kitchen. zonisamide (ZONEGRAN) 50 MG capsule One tablet daily for one week, then one tablet twice a day for one week, then take one tablet in the morning and two tablets in the evening for one week, then take 2 tablets twice a day 120 capsule 1   No facility-administered medications prior to visit.    PAST MEDICAL HISTORY: Past Medical History  Diagnosis Date  . Hypertension   . SDH (subdural hematoma)   . CVA (cerebral infarction)   . History of craniotomy   . Seizures   . Organic brain syndrome   . Dilantin toxicity   .  Right hemiparesis   . Pneumonia   . Polysubstance abuse   . Gait disorder 05/19/2014    PAST SURGICAL HISTORY: Past Surgical History  Procedure Laterality Date  . Craniotomy      following head trauma    FAMILY HISTORY: Family History  Problem Relation Age of Onset  . Heart attack Mother   . Heart attack Father     SOCIAL HISTORY: History   Social History  . Marital Status: Divorced    Spouse Name: N/A    Number of Children: 1  . Years of Education: 12   Occupational History  . retired    Social History Main Topics  . Smoking status: Current Every Day Smoker -- 1.00 packs/day  . Smokeless tobacco: Never Used  . Alcohol Use: No  . Drug Use: No  . Sexual Activity: No   Other Topics Concern  . Not on file   Social History Narrative      PHYSICAL EXAM  Filed Vitals:   07/21/14 1040  BP: 91/58  Pulse: 81  Temp: 97.5 F (36.4 C)  TempSrc: Oral   There is no weight on file to calculate BMI.  Generalized: Well developed, in no acute distress   Neurological examination  Mentation: Alert oriented to time, place, history taking. Follows all commands speech and language fluent Cranial nerve II-XII: Pupils were equal round reactive to light. Extraocular movements were full, visual field were full on confrontational test. Facial sensation and strength were normal. Uvula tongue midline. Head turning and shoulder shrug  were normal and symmetric. Motor: The motor testing reveals 5 over 5 strength in the left upper and lower extremities. 3 out of 5 strength in the right upper and lower extremity. Good symmetric motor tone is noted throughout.  Sensory: Sensory testing is intact to soft touch on all 4 extremities except decreased on the right. No evidence of extinction is noted.  Coordination: Cerebellar testing reveals good finger-nose-finger and heel-to-shin on the left. Difficulty on the right due to weakness.  Gait and station: Patient is in a wheelchair  today. Reflexes: Deep tendon reflexes are symmetric and normal bilaterally.    DIAGNOSTIC DATA (LABS, IMAGING, TESTING) - I reviewed patient records, labs, notes, testing and imaging myself where available.  Lab Results  Component Value Date   WBC 5.6 02/12/2014   HGB 10.4* 02/12/2014   HCT 31.3* 02/12/2014   MCV 71.0* 02/12/2014   PLT 294 02/12/2014      Component Value Date/Time   NA 139 02/12/2014 1602   K 4.5 02/12/2014 1602   CL 102 02/12/2014 1602   CO2 27 02/12/2014 1602   GLUCOSE 84 02/12/2014 1602   BUN 20 02/12/2014 1602   CREATININE 0.86 02/12/2014 1602   CALCIUM 9.3 02/12/2014 1602   GFRNONAA 88* 02/12/2014 1602  GFRAA >90 02/12/2014 1602      ASSESSMENT AND PLAN 68 y.o. year old male  has a past medical history of Hypertension; SDH (subdural hematoma); CVA (cerebral infarction); History of craniotomy; Seizures; Organic brain syndrome; Dilantin toxicity; Right hemiparesis; Pneumonia; Polysubstance abuse; and Gait disorder (05/19/2014). here with:  1. Seizures  Overall the patient is doing well. He denies any seizures since the last visit. He is currently taking Dilantin 200 mg daily. He will continue this. I will check blood work today-CBC, CMP, Dilantin level Pending the blood work- we may consider decreasing the Dilantin and continuing Zonegran. I will call the facility tomorrow to ensure that the patient is taking zonegran. If the patient has any additional seizures he will let us know. Otherwise he'll follow-up in 6 months.      Butch PennyMegan Godwin Tedesco, MSN, NP-C 07/21/2014, 10:44 AM [facility] [address], [address] [place], [place] [address] [phone]  Note: This document was prepared with digital dictation and possible smart phrase technology. Any transcriptional errors that result from this process are unintentional.

## 2014-07-22 ENCOUNTER — Telehealth: Payer: Self-pay | Admitting: Adult Health

## 2014-07-22 LAB — COMPREHENSIVE METABOLIC PANEL
A/G RATIO: 1.7 (ref 1.1–2.5)
ALBUMIN: 4.3 g/dL (ref 3.6–4.8)
ALK PHOS: 176 IU/L — AB (ref 39–117)
ALT: 11 IU/L (ref 0–44)
AST: 17 IU/L (ref 0–40)
BUN / CREAT RATIO: 26 — AB (ref 10–22)
BUN: 20 mg/dL (ref 8–27)
CO2: 20 mmol/L (ref 18–29)
CREATININE: 0.77 mg/dL (ref 0.76–1.27)
Calcium: 9.2 mg/dL (ref 8.6–10.2)
Chloride: 103 mmol/L (ref 97–108)
GFR, EST AFRICAN AMERICAN: 109 mL/min/{1.73_m2} (ref >59)
GFR, EST NON AFRICAN AMERICAN: 95 mL/min/{1.73_m2} (ref >59)
GLOBULIN, TOTAL: 2.5 g/dL (ref 1.5–4.5)
GLUCOSE: 65 mg/dL (ref 65–99)
Potassium: 4.4 mmol/L (ref 3.5–5.2)
Sodium: 138 mmol/L (ref 134–144)
TOTAL PROTEIN: 6.8 g/dL (ref 6.0–8.5)

## 2014-07-22 LAB — CBC WITH DIFFERENTIAL
BASOS: 0 %
Basophils Absolute: 0 10*3/uL (ref 0.0–0.2)
Eos: 2 %
Eosinophils Absolute: 0.1 10*3/uL (ref 0.0–0.4)
HEMATOCRIT: 31.1 % — AB (ref 37.5–51.0)
Hemoglobin: 10.1 g/dL — ABNORMAL LOW (ref 12.6–17.7)
Immature Grans (Abs): 0 10*3/uL (ref 0.0–0.1)
Immature Granulocytes: 0 %
Lymphocytes Absolute: 2.1 10*3/uL (ref 0.7–3.1)
Lymphs: 37 %
MCH: 23.9 pg — ABNORMAL LOW (ref 26.6–33.0)
MCHC: 32.5 g/dL (ref 31.5–35.7)
MCV: 74 fL — ABNORMAL LOW (ref 79–97)
MONOCYTES: 12 %
Monocytes Absolute: 0.7 10*3/uL (ref 0.1–0.9)
NEUTROS ABS: 2.7 10*3/uL (ref 1.4–7.0)
Neutrophils Relative %: 49 %
Platelets: 253 10*3/uL (ref 150–379)
RBC: 4.23 x10E6/uL (ref 4.14–5.80)
RDW: 17.7 % — AB (ref 12.3–15.4)
WBC: 5.6 10*3/uL (ref 3.4–10.8)

## 2014-07-22 LAB — PHENYTOIN LEVEL, TOTAL: Phenytoin Lvl: 9.3 ug/mL — ABNORMAL LOW (ref 10.0–20.0)

## 2014-07-22 MED ORDER — PHENYTOIN SODIUM EXTENDED 100 MG PO CAPS: 100.0000 mg | ORAL_CAPSULE | Freq: Every evening | ORAL | Status: DC

## 2014-07-22 NOTE — Telephone Encounter (Signed)
I called [facility] and spoke to the patient's nurse. She confirmed that the patient is taken Zonegran 2 tablets twice a day. The patient's lab work was unremarkable. I will decrease his Dilantin to one tablet at bedtime. If he has any additional seizures the facility should let us know. His nurse verbalized understanding.

## 2014-10-19 ENCOUNTER — Emergency Department (HOSPITAL_COMMUNITY)
Admission: EM | Admit: 2014-10-19 | Discharge: 2014-10-19 | Disposition: A | Discharge status: Home / Self Care | Payer: Medicare Other | Attending: Emergency Medicine | Admitting: Emergency Medicine

## 2014-10-19 ENCOUNTER — Encounter (HOSPITAL_COMMUNITY): Payer: Self-pay | Admitting: Emergency Medicine

## 2014-10-19 DIAGNOSIS — Z8673 Personal history of transient ischemic attack (TIA), and cerebral infarction without residual deficits: Secondary | ICD-10-CM | POA: Diagnosis not present

## 2014-10-19 DIAGNOSIS — Z7982 Long term (current) use of aspirin: Secondary | ICD-10-CM | POA: Diagnosis not present

## 2014-10-19 DIAGNOSIS — Z8659 Personal history of other mental and behavioral disorders: Secondary | ICD-10-CM | POA: Diagnosis not present

## 2014-10-19 DIAGNOSIS — G40909 Epilepsy, unspecified, not intractable, without status epilepticus: Secondary | ICD-10-CM | POA: Insufficient documentation

## 2014-10-19 DIAGNOSIS — L089 Local infection of the skin and subcutaneous tissue, unspecified: Secondary | ICD-10-CM | POA: Diagnosis present

## 2014-10-19 DIAGNOSIS — Z79899 Other long term (current) drug therapy: Secondary | ICD-10-CM | POA: Diagnosis not present

## 2014-10-19 DIAGNOSIS — L988 Other specified disorders of the skin and subcutaneous tissue: Secondary | ICD-10-CM | POA: Insufficient documentation

## 2014-10-19 DIAGNOSIS — I10 Essential (primary) hypertension: Secondary | ICD-10-CM | POA: Insufficient documentation

## 2014-10-19 DIAGNOSIS — Z8701 Personal history of pneumonia (recurrent): Secondary | ICD-10-CM | POA: Diagnosis not present

## 2014-10-19 DIAGNOSIS — Z720 Tobacco use: Secondary | ICD-10-CM | POA: Diagnosis not present

## 2014-10-19 DIAGNOSIS — S90424A Blister (nonthermal), right lesser toe(s), initial encounter: Secondary | ICD-10-CM

## 2014-10-19 LAB — CBG MONITORING, ED: Glucose-Capillary: 79 mg/dL (ref 70–99)

## 2014-10-19 MED ORDER — BACITRACIN ZINC 500 UNIT/GM EX OINT
1.0000 "application " | TOPICAL_OINTMENT | Freq: Once | CUTANEOUS | Status: AC
  Administered 2014-10-19: 1 via TOPICAL
  Filled 2014-10-19: qty 0.9

## 2014-10-19 NOTE — ED Notes (Signed)
Bed: WA08 Expected date:  Expected time:  Means of arrival:  Comments: ems 

## 2014-10-19 NOTE — Discharge Instructions (Signed)
    Blisters Blisters are fluid-filled sacs that form within the skin. Common causes of blistering are friction, burns, and exposure to irritating chemicals. The fluid in the blister protects the underlying damaged skin. Most of the time it is not recommended that you open blisters. When a blister is opened, there is an increased chance for infection. Usually, a blister will open on its own. They then dry up and peel off within 10 days. If the blister is tense and uncomfortable (painful) the fluid may be drained. If it is drained the roof of the blister should be left intact. The draining should only be done by a medical professional under aseptic conditions. Poorly fitting shoes and boots can cause blisters by being too tight or too loose. Wearing extra socks or using tape, bandages, or pads over the blister-prone area helps prevent the problem by reducing friction. Blisters heal more slowly if you have diabetes or if you have problems with your circulation. You need to be careful about medical follow-up to prevent infection. HOME CARE INSTRUCTIONS  Protect areas where blisters have formed until the skin is healed. Use a special bandage with a hole cut in the middle around the blister. This reduces pressure and friction. When the blister breaks, trim off the loose skin and keep the area clean by washing it with soap daily. Soaking the blister or broken-open blister with diluted vinegar twice daily for 15 minutes will dry it up and speed the healing. Use 3 tablespoons of white vinegar per quart of water (45 mL white vinegar per liter of water). An antibiotic ointment and a bandage can be used to cover the area after soaking.  SEEK MEDICAL CARE IF:   You develop increased redness, pain, swelling, or drainage in the blistered area.  You develop a pus-like discharge from the blistered area, chills, or a fever. MAKE SURE YOU:   Understand these instructions.  Will watch your condition.  Will get help  right away if you are not doing well or get worse. Document Released: 08/30/2004 Document Revised: 10/15/2011 Document Reviewed: 07/28/2008 ExitCare Patient Information 2015 [employer]. This information is not intended to replace advice given to you by your health care provider. Make sure you discuss any questions you have with your health care provider.  

## 2014-10-19 NOTE — ED Notes (Signed)
Pt has wound on right foot on bottom between 1st and 2nd metatarsal. Pt is not ambulatory. Staff noticed today and wrapped it said it was bleeding. No blood noticed by EMS

## 2014-10-19 NOTE — ED Provider Notes (Signed)
CSN: [id number]     Arrival date & time 10/19/14  1527 History   First MD Initiated Contact with Patient 10/19/14 1536     Chief Complaint  Patient presents with  . Wound Infection   HPI Patient was sent into the emergency room to evaluate the wound on the bottom of his foot in between his second and third toe on the plantar aspect.  According to staff at the facility he lives that they noticed from the wound and so he was sent into the emergency room. The patient denies any trouble with any fevers or chills.  He denies any pain in that area. He denies any specific complaints. Patient does have a history of stroke and has right hemiparesis. At baseline he has difficulty walking. Past Medical History  Diagnosis Date  . Hypertension   . SDH (subdural hematoma)   . CVA (cerebral infarction)   . History of craniotomy   . Seizures   . Organic brain syndrome   . Dilantin toxicity   . Right hemiparesis   . Pneumonia   . Polysubstance abuse   . Gait disorder 05/19/2014   Past Surgical History  Procedure Laterality Date  . Craniotomy      following head trauma   Family History  Problem Relation Age of Onset  . Heart attack Mother   . Heart attack Father    History  Substance Use Topics  . Smoking status: Current Every Day Smoker -- 1.00 packs/day  . Smokeless tobacco: Never Used  . Alcohol Use: No    Review of Systems  All other systems reviewed and are negative.     Allergies  Carbamazepine and Tricyclic antidepressants  Home Medications   Prior to Admission medications   Medication Sig Start Date End Date Taking? Authorizing Provider  acetaminophen (TYLENOL) 325 MG tablet Take 650 mg by mouth every morning.   Yes Historical Provider, MD  acetaminophen (TYLENOL) 650 MG CR tablet Take 650 mg by mouth every 8 (eight) hours as needed for pain.   Yes Historical Provider, MD  aspirin EC 81 MG tablet Take 81 mg by mouth daily.   Yes Historical Provider, MD  Cholecalciferol  (VITAMIN D3) 2000 UNITS TABS Take 4,000 Units by mouth daily.   Yes Historical Provider, MD  DULoxetine (CYMBALTA) 30 MG capsule Take 30 mg by mouth daily.   Yes Historical Provider, MD  ferrous sulfate 325 (65 FE) MG tablet Take 325 mg by mouth 2 (two) times daily.    Yes Historical Provider, MD  gabapentin (NEURONTIN) 600 MG tablet Take 600 mg by mouth 2 (two) times daily.   Yes Historical Provider, MD  hydrocortisone cream 1 % Apply 1 application topically every 6 (six) hours as needed for itching.   Yes Historical Provider, MD  latanoprost (XALATAN) 0.005 % ophthalmic solution Place 1 drop into both eyes at bedtime.   Yes Historical Provider, MD  levETIRAcetam (KEPPRA) 1000 MG tablet Take 1 tablet (1,000 mg total) by mouth 2 (two) times daily. 05/19/14  Yes York Spanielharles K [name], MD  lisinopril (PRINIVIL,ZESTRIL) 20 MG tablet Take 20 mg by mouth daily.   Yes Historical Provider, MD  Multiple Vitamins-Iron (MULTIVITAMINS WITH IRON) TABS Take 1 tablet by mouth daily.   Yes Historical Provider, MD  Nutritional Supplements (NUTRITIONAL SHAKE PO) Take 1 each by mouth 3 (three) times daily with meals. MIGHTY SHAKES   Yes Historical Provider, MD  ondansetron (ZOFRAN) 4 MG tablet Take 4 mg by mouth every 8 (  eight) hours as needed for nausea or vomiting.   Yes Historical Provider, MD  phenytoin (DILANTIN) 100 MG ER capsule Take 1 capsule (100 mg total) by mouth every evening. 07/22/14  Yes Megan Claris Gladden, NP  vitamin C (ASCORBIC ACID) 500 MG tablet Take 500 mg by mouth 2 (two) times daily.   Yes Historical Provider, MD  zonisamide (ZONEGRAN) 50 MG capsule One tablet daily for one week, then one tablet twice a day for one week, then take one tablet in the morning and two tablets in the evening for one week, then take 2 tablets twice a day Patient taking differently: Take 100 mg by mouth 2 (two) times daily.  05/19/14  Yes York Spaniel, MD   BP 98/62 mmHg  Pulse 71  Temp(Src) 98.4 F (36.9 C) (Oral)   Resp 16  SpO2 95% Physical Exam  Constitutional: He appears well-developed and well-nourished. No distress.  HENT:  Head: Normocephalic and atraumatic.  Right Ear: External ear normal.  Left Ear: External ear normal.  Eyes: Conjunctivae are normal. Right eye exhibits no discharge. Left eye exhibits no discharge. No scleral icterus.  Neck: Neck supple. No tracheal deviation present.  Cardiovascular: Normal rate.  Exam reveals friction rub.   Pulmonary/Chest: Effort normal. No stridor. No respiratory distress.  Musculoskeletal: He exhibits no edema.  Macerated superficial just to consistent with a ruptured blister distal aspect of his second toe, serous drainage, some macerated tissue on the third toe where the toes touch, strong dorsalis pedis pulse, no erythema  Neurological: He is alert. Cranial nerve deficit: no gross deficits.  Skin: Skin is warm and dry. No rash noted.  Psychiatric: He has a normal mood and affect.  Nursing note and vitals reviewed.   ED Course  Procedures (including critical care time)   MDM   Final diagnoses:  Blister of toe, right, initial encounter    Patient has a blistered wound on his toe. He does not have any erythema. He does not have any purulent drainage. There does not appear to be cellulitis or vascular compromise. I will apply antibiotic ointment and a dressing.   Linwood Dibbles, MD 10/19/14 1600

## 2014-12-14 ENCOUNTER — Encounter (HOSPITAL_COMMUNITY): Payer: Self-pay | Admitting: *Deleted

## 2014-12-14 ENCOUNTER — Other Ambulatory Visit: Payer: Self-pay | Admitting: Gastroenterology

## 2014-12-14 NOTE — Progress Notes (Signed)
Nurse Glee ArvinLatoya made aware to stop Aspirin,otc vitamins and herbal medications. Do not take any NSAIDs ie: Ibuprofen, Advil, Naproxen or any medication containing Aspirin.

## 2014-12-14 NOTE — Addendum Note (Signed)
Addended by: Naureen Benton on: 12/14/2014 01:32 PM   Modules accepted: Orders  

## 2014-12-14 NOTE — Progress Notes (Signed)
Pt SDW- pre-op call completed by pt nurse, [facility]. Please complete anesthesia assessment on DOS and measure pt neck to complete Sleep Apnea Screening. Nurse verbalized understanding of all pre-op instructions.

## 2014-12-15 ENCOUNTER — Ambulatory Visit (HOSPITAL_COMMUNITY)
Admission: RE | Admit: 2014-12-15 | Discharge: 2014-12-15 | Disposition: A | Discharge status: Home / Self Care | Payer: Medicare Other | Source: Ambulatory Visit | Attending: Gastroenterology | Admitting: Gastroenterology

## 2014-12-15 ENCOUNTER — Ambulatory Visit (HOSPITAL_COMMUNITY): Payer: Medicare Other | Admitting: Certified Registered Nurse Anesthetist

## 2014-12-15 ENCOUNTER — Encounter (HOSPITAL_COMMUNITY): Payer: Self-pay | Admitting: *Deleted

## 2014-12-15 ENCOUNTER — Encounter (HOSPITAL_COMMUNITY)
Admission: RE | Disposition: A | Discharge status: Home / Self Care | Payer: Self-pay | Source: Ambulatory Visit | Attending: Gastroenterology

## 2014-12-15 DIAGNOSIS — K5730 Diverticulosis of large intestine without perforation or abscess without bleeding: Secondary | ICD-10-CM | POA: Diagnosis not present

## 2014-12-15 DIAGNOSIS — Z1211 Encounter for screening for malignant neoplasm of colon: Secondary | ICD-10-CM

## 2014-12-15 HISTORY — PX: COLONOSCOPY WITH PROPOFOL: SHX5780

## 2014-12-15 SURGERY — COLONOSCOPY WITH PROPOFOL
Anesthesia: Monitor Anesthesia Care

## 2014-12-15 MED ORDER — PROPOFOL 10 MG/ML IV BOLUS
INTRAVENOUS | Status: DC | PRN
Start: 2014-12-15 — End: 2014-12-15
  Administered 2014-12-15: 30 mg via INTRAVENOUS

## 2014-12-15 MED ORDER — PROPOFOL INFUSION 10 MG/ML OPTIME
INTRAVENOUS | Status: DC | PRN
  Administered 2014-12-15: 75 ug/kg/min via INTRAVENOUS

## 2014-12-15 MED ORDER — SODIUM CHLORIDE 0.9 % IV SOLN: INTRAVENOUS | Status: DC

## 2014-12-15 MED ORDER — LACTATED RINGERS IV SOLN: INTRAVENOUS | Status: DC

## 2014-12-15 MED ORDER — LACTATED RINGERS IV SOLN
INTRAVENOUS | Status: DC | PRN
  Administered 2014-12-15: 09:00:00 via INTRAVENOUS

## 2014-12-15 MED ORDER — LACTATED RINGERS IV SOLN
INTRAVENOUS | Status: DC
  Administered 2014-12-15: 09:00:00 via INTRAVENOUS

## 2014-12-15 NOTE — Transfer of Care (Signed)
Immediate Anesthesia Transfer of Care Note  Patient: [name]  Procedure(s) Performed: Procedure(s): COLONOSCOPY WITH PROPOFOL (N/A)  Patient Location: Endoscopy Unit  Anesthesia Type:MAC  Level of Consciousness: awake and alert   Airway & Oxygen Therapy: Patient Spontanous Breathing  Post-op Assessment: Report given to RN and Post -op Vital signs reviewed and stable  Post vital signs: Reviewed and stable  Last Vitals:  Filed Vitals:   12/15/14 0835  BP: 107/68  Temp: 36.7 C  Resp: 9    Complications: No apparent anesthesia complications

## 2014-12-15 NOTE — Anesthesia Preprocedure Evaluation (Addendum)
Anesthesia Evaluation  Patient identified by MRN, date of birth, ID band Patient awake    Reviewed: Allergy & Precautions, H&P , NPO status , Patient's Chart, lab work & pertinent test results  Airway Mallampati: II  TM Distance: >3 FB Neck ROM: Full    Dental no notable dental hx. (+) Poor Dentition, Dental Advisory Given   Pulmonary Current Smoker,  breath sounds clear to auscultation  Pulmonary exam normal       Cardiovascular hypertension, Pt. on medications negative cardio ROS  Rhythm:Regular Rate:Normal     Neuro/Psych Seizures -, Well Controlled,  CVA, Residual Symptoms negative psych ROS   GI/Hepatic negative GI ROS, Neg liver ROS,   Endo/Other  negative endocrine ROS  Renal/GU negative Renal ROS  negative genitourinary   Musculoskeletal   Abdominal   Peds  Hematology negative hematology ROS (+)   Anesthesia Other Findings   Reproductive/Obstetrics negative OB ROS                          Anesthesia Physical Anesthesia Plan  ASA: III  Anesthesia Plan: MAC   Post-op Pain Management:    Induction: Intravenous  Airway Management Planned: Simple Face Mask  Additional Equipment:   Intra-op Plan:   Post-operative Plan:   Informed Consent: I have reviewed the patients History and Physical, chart, labs and discussed the procedure including the risks, benefits and alternatives for the proposed anesthesia with the patient or authorized representative who has indicated his/her understanding and acceptance.   Dental advisory given  Plan Discussed with: CRNA  Anesthesia Plan Comments:         Anesthesia Quick Evaluation

## 2014-12-15 NOTE — H&P (Signed)
  Date of Initial H&P: 12/02/14  History reviewed, patient examined, no change in status, stable for surgery. 

## 2014-12-15 NOTE — Op Note (Signed)
[facility] [address] [place], [id number]   COLONOSCOPY PROCEDURE REPORT     EXAM DATE: 12/15/2014  PATIENT NAME:      [name], [name]           MR #: [id number] BIRTHDATE:       Jul 03, 1946      VISIT #:     [id number]  ATTENDING:     Charlott RakesVincent Omarion Minnehan, MD     STATUS:     outpatient REFERRING MD: ASA CLASS:        Class III  INDICATIONS:  The patient is a 69 yr old male here for a colonoscopy due to average risk patient for colon cancer. PROCEDURE PERFORMED:     Colonoscopy, screening MEDICATIONS:     Monitored anesthesia care and Per Anesthesia  ESTIMATED BLOOD LOSS:     None  CONSENT: The patient understands the risks and benefits of the procedure and understands that these risks include, but are not limited to: sedation, allergic reaction, infection, perforation and/or bleeding. Alternative means of evaluation and treatment include, among others: physical exam, x-rays, and/or surgical intervention. The patient elects to proceed with this endoscopic procedure.  DESCRIPTION OF PROCEDURE: During intra-op preparation period all mechanical & medical equipment was checked for proper function. Hand hygiene and appropriate measures for infection prevention was taken. After the risks, benefits and alternatives of the procedure were thoroughly explained, Informed consent was verified, confirmed and timeout was successfully executed by the treatment team. A digital exam revealed no abnormalities of the rectum.      The Pentax Ped Colon [id number] endoscope was introduced through the anus and advanced to the cecum, which was identified by both the appendix and ileocecal valve. The prep was fair.. The instrument was then slowly withdrawn as the colon was fully examined. Estimated blood loss is zero unless otherwise noted in this procedure report. Scattered large and small diverticulae seen in the ascending colon and in the sigmoid colon. No  other mucosal abnormalities seen. Retroflexed views revealed no abnormalities.  The scope was then completely withdrawn from the patient and the procedure terminated.      ADVERSE EVENTS:      There were no immediate complications.   IMPRESSIONS:     Scattered Diverticulosis otherwise normal colonoscopy  RECOMMENDATIONS:     No repeat colonoscopy recommended   Charlott RakesVincent Copeland Lapier, MD eSigned:  Charlott RakesVincent Mitcheal Sweetin, MD 12/15/2014 9:51 AM   cc:  CPT CODES: ICD CODES:  The ICD and CPT codes recommended by this software are interpretations from the data that the clinical staff has captured with the software.  The verification of the translation of this report to the ICD and CPT codes and modifiers is the sole responsibility of the health care institution and practicing physician where this report was generated.  [employer] will not be held responsible for the validity of the ICD and CPT codes included on this report.  AMA assumes no liability for data contained or not contained herein. CPT is a Publishing rights managerregistered trademark of the [employer].

## 2014-12-15 NOTE — Anesthesia Postprocedure Evaluation (Signed)
  Anesthesia Post-op Note  Patient: [name]  Procedure(s) Performed: Procedure(s): COLONOSCOPY WITH PROPOFOL (N/A)  Patient Location: PACU and Endoscopy Unit  Anesthesia Type:MAC  Level of Consciousness: awake  Airway and Oxygen Therapy: Patient Spontanous Breathing  Post-op Pain: mild  Post-op Assessment: Post-op Vital signs reviewed  Post-op Vital Signs: Reviewed  Last Vitals:  Filed Vitals:   12/15/14 1011  BP: 108/56  Pulse: 72  Temp:   Resp: 14    Complications: No apparent anesthesia complications

## 2014-12-15 NOTE — Discharge Instructions (Signed)
Colonoscopy, Care After °These instructions give you information on caring for yourself after your procedure. Your doctor may also give you more specific instructions. Call your doctor if you have any problems or questions after your procedure. °HOME CARE °· Do not drive for 24 hours. °· Do not sign important papers or use machinery for 24 hours. °· You may shower. °· You may go back to your usual activities, but go slower for the first 24 hours. °· Take rest breaks often during the first 24 hours. °· Walk around or use warm packs on your belly (abdomen) if you have belly cramping or gas. °· Drink enough fluids to keep your pee (urine) clear or pale yellow. °· Resume your normal diet. Avoid heavy or fried foods. °· Avoid drinking alcohol for 24 hours or as told by your doctor. °· Only take medicines as told by your doctor. °If a tissue sample (biopsy) was taken during the procedure:  °· Do not take aspirin or blood thinners for 7 days, or as told by your doctor. °· Do not drink alcohol for 7 days, or as told by your doctor. °· Eat soft foods for the first 24 hours. °GET HELP IF: °You still have a small amount of blood in your poop (stool) 2-3 days after the procedure. °GET HELP RIGHT AWAY IF: °· You have more than a small amount of blood in your poop. °· You see clumps of tissue (blood clots) in your poop. °· Your belly is puffy (swollen). °· You feel sick to your stomach (nauseous) or throw up (vomit). °· You have a fever. °· You have belly pain that gets worse and medicine does not help. °MAKE SURE YOU: °· Understand these instructions. °· Will watch your condition. °· Will get help right away if you are not doing well or get worse. °Document Released: 08/25/2010 Document Revised: 07/28/2013 Document Reviewed: 03/30/2013 °ExitCare® Patient Information ©2015 [employer]. This information is not intended to replace advice given to you by your health care provider. Make sure you discuss any questions you have with  your health care provider. ° °

## 2014-12-15 NOTE — Interval H&P Note (Signed)
History and Physical Interval Note:  12/15/2014 8:42 AM  [name]  has presented today for surgery, with the diagnosis of screening  The various methods of treatment have been discussed with the patient and family. After consideration of risks, benefits and other options for treatment, the patient has consented to  Procedure(s): COLONOSCOPY WITH PROPOFOL (N/A) as a surgical intervention .  The patient's history has been reviewed, patient examined, no change in status, stable for surgery.  I have reviewed the patient's chart and labs.  Questions were answered to the patient's satisfaction.     Peter Sisneros C.

## 2014-12-16 ENCOUNTER — Encounter (HOSPITAL_COMMUNITY): Payer: Self-pay | Admitting: Gastroenterology

## 2014-12-19 ENCOUNTER — Emergency Department (HOSPITAL_COMMUNITY)
Admission: EM | Admit: 2014-12-19 | Discharge: 2014-12-19 | Disposition: A | Discharge status: Home / Self Care | Payer: Medicare Other | Attending: Emergency Medicine | Admitting: Emergency Medicine

## 2014-12-19 ENCOUNTER — Emergency Department (HOSPITAL_COMMUNITY): Payer: Medicare Other

## 2014-12-19 ENCOUNTER — Encounter (HOSPITAL_COMMUNITY): Payer: Self-pay | Admitting: *Deleted

## 2014-12-19 DIAGNOSIS — W01198A Fall on same level from slipping, tripping and stumbling with subsequent striking against other object, initial encounter: Secondary | ICD-10-CM | POA: Insufficient documentation

## 2014-12-19 DIAGNOSIS — I10 Essential (primary) hypertension: Secondary | ICD-10-CM | POA: Diagnosis not present

## 2014-12-19 DIAGNOSIS — Z8673 Personal history of transient ischemic attack (TIA), and cerebral infarction without residual deficits: Secondary | ICD-10-CM | POA: Diagnosis not present

## 2014-12-19 DIAGNOSIS — S0081XA Abrasion of other part of head, initial encounter: Secondary | ICD-10-CM | POA: Insufficient documentation

## 2014-12-19 DIAGNOSIS — Z79899 Other long term (current) drug therapy: Secondary | ICD-10-CM | POA: Insufficient documentation

## 2014-12-19 DIAGNOSIS — Z8669 Personal history of other diseases of the nervous system and sense organs: Secondary | ICD-10-CM | POA: Insufficient documentation

## 2014-12-19 DIAGNOSIS — Y9389 Activity, other specified: Secondary | ICD-10-CM | POA: Diagnosis not present

## 2014-12-19 DIAGNOSIS — Y998 Other external cause status: Secondary | ICD-10-CM | POA: Diagnosis not present

## 2014-12-19 DIAGNOSIS — S0990XA Unspecified injury of head, initial encounter: Secondary | ICD-10-CM | POA: Diagnosis present

## 2014-12-19 DIAGNOSIS — G40909 Epilepsy, unspecified, not intractable, without status epilepticus: Secondary | ICD-10-CM | POA: Diagnosis not present

## 2014-12-19 DIAGNOSIS — Z8659 Personal history of other mental and behavioral disorders: Secondary | ICD-10-CM | POA: Insufficient documentation

## 2014-12-19 DIAGNOSIS — Z8701 Personal history of pneumonia (recurrent): Secondary | ICD-10-CM | POA: Insufficient documentation

## 2014-12-19 DIAGNOSIS — Z7982 Long term (current) use of aspirin: Secondary | ICD-10-CM | POA: Diagnosis not present

## 2014-12-19 DIAGNOSIS — Y9289 Other specified places as the place of occurrence of the external cause: Secondary | ICD-10-CM | POA: Diagnosis not present

## 2014-12-19 DIAGNOSIS — W19XXXA Unspecified fall, initial encounter: Secondary | ICD-10-CM

## 2014-12-19 DIAGNOSIS — Z720 Tobacco use: Secondary | ICD-10-CM | POA: Insufficient documentation

## 2014-12-19 MED ORDER — HYDROGEN PEROXIDE 3 % EX SOLN
CUTANEOUS | Status: AC
  Filled 2014-12-19: qty 473

## 2014-12-19 MED ORDER — ACETAMINOPHEN 325 MG PO TABS
650.0000 mg | ORAL_TABLET | Freq: Once | ORAL | Status: AC
  Administered 2014-12-19: 650 mg via ORAL
  Filled 2014-12-19: qty 2

## 2014-12-19 NOTE — ED Notes (Signed)
Bed: WA20 Expected date:  Expected time:  Means of arrival:  Comments: EMS-fall 

## 2014-12-19 NOTE — ED Notes (Signed)
MD at bedside. 

## 2014-12-19 NOTE — Discharge Instructions (Signed)

## 2014-12-19 NOTE — ED Provider Notes (Signed)
CSN: [id number]     Arrival date & time 12/19/14  1137 History   First MD Initiated Contact with Patient 12/19/14 1209     Chief Complaint  Patient presents with  . Fall     (Consider location/radiation/quality/duration/timing/severity/associated sxs/prior Treatment) Patient is a 69 y.o. male presenting with fall. The history is provided by the patient.  Fall This is a new problem. The current episode started 1 to 2 hours ago. Episode frequency: once. The problem has been resolved. Pertinent negatives include no chest pain, no abdominal pain, no headaches and no shortness of breath. Nothing aggravates the symptoms. Nothing relieves the symptoms. He has tried nothing for the symptoms. The treatment provided significant relief.    Past Medical History  Diagnosis Date  . Hypertension   . SDH (subdural hematoma)   . CVA (cerebral infarction)   . History of craniotomy   . Seizures   . Organic brain syndrome   . Dilantin toxicity   . Right hemiparesis   . Pneumonia   . Polysubstance abuse   . Gait disorder 05/19/2014   Past Surgical History  Procedure Laterality Date  . Craniotomy      following head trauma  . Colonoscopy with propofol N/A 12/15/2014    Procedure: COLONOSCOPY WITH PROPOFOL;  Surgeon: Charlott Rakes, MD;  Location: [facility] ENDOSCOPY;  Service: Endoscopy;  Laterality: N/A;   Family History  Problem Relation Age of Onset  . Heart attack Mother   . Heart attack Father    History  Substance Use Topics  . Smoking status: Current Every Day Smoker -- 1.00 packs/day  . Smokeless tobacco: Never Used  . Alcohol Use: No    Review of Systems  Constitutional: Negative for fever.  HENT: Negative for drooling and rhinorrhea.   Eyes: Negative for pain.  Respiratory: Negative for cough and shortness of breath.   Cardiovascular: Negative for chest pain and leg swelling.  Gastrointestinal: Negative for nausea, vomiting, abdominal pain and diarrhea.  Genitourinary: Negative  for dysuria and hematuria.  Musculoskeletal: Negative for gait problem and neck pain.  Skin: Negative for color change.  Neurological: Negative for numbness and headaches.  Hematological: Negative for adenopathy.  Psychiatric/Behavioral: Negative for behavioral problems.  All other systems reviewed and are negative.     Allergies  Carbamazepine and Tricyclic antidepressants  Home Medications   Prior to Admission medications   Medication Sig Start Date End Date Taking? Authorizing Provider  acetaminophen (TYLENOL) 325 MG tablet Take 650 mg by mouth every morning.   Yes Historical Provider, MD  acetaminophen (TYLENOL) 650 MG CR tablet Take 650 mg by mouth every 8 (eight) hours as needed for pain.   Yes Historical Provider, MD  aspirin EC 81 MG tablet Take 81 mg by mouth daily.   Yes Historical Provider, MD  Cholecalciferol (VITAMIN D3) 2000 UNITS TABS Take 4,000 Units by mouth daily.   Yes Historical Provider, MD  DULoxetine (CYMBALTA) 30 MG capsule Take 30 mg by mouth at bedtime.    Yes Historical Provider, MD  ferrous sulfate 325 (65 FE) MG tablet Take 325 mg by mouth 2 (two) times daily.    Yes Historical Provider, MD  gabapentin (NEURONTIN) 600 MG tablet Take 600 mg by mouth 2 (two) times daily.   Yes Historical Provider, MD  latanoprost (XALATAN) 0.005 % ophthalmic solution Place 1 drop into both eyes at bedtime.   Yes Historical Provider, MD  levETIRAcetam (KEPPRA) 1000 MG tablet Take 1 tablet (1,000 mg total) by mouth  2 (two) times daily. 05/19/14  Yes York Spanielharles K [name], MD  lisinopril (PRINIVIL,ZESTRIL) 20 MG tablet Take 20 mg by mouth daily.   Yes Historical Provider, MD  Multiple Vitamins-Iron (MULTIVITAMINS WITH IRON) TABS Take 1 tablet by mouth daily.   Yes Historical Provider, MD  Nutritional Supplements (NUTRITIONAL SHAKE PO) Take 1 each by mouth 3 (three) times daily with meals. MIGHTY SHAKES   Yes Historical Provider, MD  ondansetron (ZOFRAN) 4 MG tablet Take 4 mg by mouth  every 8 (eight) hours as needed for nausea or vomiting.   Yes Historical Provider, MD  phenytoin (DILANTIN) 100 MG ER capsule Take 1 capsule (100 mg total) by mouth every evening. 07/22/14  Yes Butch PennyMegan Millikan, NP  vitamin C (ASCORBIC ACID) 500 MG tablet Take 500 mg by mouth 2 (two) times daily.   Yes Historical Provider, MD  zonisamide (ZONEGRAN) 50 MG capsule One tablet daily for one week, then one tablet twice a day for one week, then take one tablet in the morning and two tablets in the evening for one week, then take 2 tablets twice a day Patient taking differently: Take 100 mg by mouth 2 (two) times daily.  05/19/14  Yes York Spanielharles K [name], MD  hydrocortisone cream 1 % Apply 1 application topically every 6 (six) hours as needed for itching.    Historical Provider, MD   BP 107/66 mmHg  Pulse 82  Temp(Src) 97.9 F (36.6 C) (Oral)  Resp 16  SpO2 99% Physical Exam  Constitutional: He is oriented to person, place, and time. He appears well-developed and well-nourished.  HENT:  Head: Normocephalic and atraumatic.    Right Ear: External ear normal.  Left Ear: External ear normal.  Nose: Nose normal.  Mouth/Throat: Oropharynx is clear and moist. No oropharyngeal exudate.  2 cm superficial laceration noted in the right lateral periorbital area.  1 cm superficial lacerations noted on the nasal bridge and also on the left lateral periorbital area.  Eyes: Conjunctivae and EOM are normal. Pupils are equal, round, and reactive to light.  Neck: Normal range of motion. Neck supple.  Cardiovascular: Normal rate, regular rhythm, normal heart sounds and intact distal pulses.  Exam reveals no gallop and no friction rub.   No murmur heard. Pulmonary/Chest: Effort normal and breath sounds normal. No respiratory distress. He has no wheezes.  Abdominal: Soft. Bowel sounds are normal. He exhibits no distension. There is no tenderness. There is no rebound and no guarding.  Musculoskeletal: Normal range of  motion. He exhibits no edema or tenderness.  Neurological: He is alert and oriented to person, place, and time.  alert, oriented x2, doesn't know year speech: normal in context and clarity memory: intact grossly cranial nerves II-XII: intact motor strength: 4/5 in RUE, 2/5 in RLE, 5/5 in LUE and LLE Sensation: slightly altered in RUE/RLE, pt states this is baseline, otherwise intact to light touch diffusely  cerebellar: finger-to-nose intact gait: deferred  Skin: Skin is warm and dry.  Psychiatric: He has a normal mood and affect. His behavior is normal.  Nursing note and vitals reviewed.   ED Course  LACERATION REPAIR Date/Time: 12/19/2014 3:11 PM Performed by: Purvis SheffieldHARRISON, Cedrica Brune Authorized by: Purvis SheffieldHARRISON, Neveah Bang Consent: Verbal consent obtained. Written consent not obtained. Risks and benefits: risks, benefits and alternatives were discussed Consent given by: patient Patient understanding: patient states understanding of the procedure being performed Required items: required blood products, implants, devices, and special equipment available Patient identity confirmed: arm band, verbally with patient and provided  demographic data Time out: Immediately prior to procedure a "time out" was called to verify the correct patient, procedure, equipment, support staff and site/side marked as required. Location:  right periorbital area, nasal bridge. Laceration length: 2 cm Foreign bodies: no foreign bodies Tendon involvement: none Nerve involvement: none Vascular damage: no Irrigation solution: saf clens, hydrogen peroxide. Amount of cleaning: standard Debridement: none Degree of undermining: none Skin closure: glue Patient tolerance: Patient tolerated the procedure well with no immediate complications   (including critical care time) Labs Review Labs Reviewed - No data to display  Imaging Review Ct Head Wo Contrast  12/19/2014   CLINICAL DATA:  Facial injury after fall at nursing  facility.  EXAM: CT HEAD WITHOUT CONTRAST  TECHNIQUE: Contiguous axial images were obtained from the base of the skull through the vertex without intravenous contrast.  COMPARISON:  CT scan of February 12, 2014.  FINDINGS: Status post right frontal craniotomy. Moderate diffuse cortical atrophy is noted. No mass effect or midline shift is noted. Ventricular size is within normal limits. There is no evidence of mass lesion, hemorrhage or acute infarction.  IMPRESSION: Status post large right frontal craniotomy. Moderate diffuse cortical atrophy. No acute intracranial abnormality seen.   Electronically Signed   By: Lupita RaiderJames  Green Jr, [name]   On: 12/19/2014 13:46     EKG Interpretation None      MDM   Final diagnoses:  Fall, initial encounter  Abrasion of face, initial encounter     1:10 PM 69 y.o. male w hx of HTN, SDH, CVA w/ right sided hemiparesis, seizures Who presents with a mechanical fall which occurred prior to arrival. He states that he is typically wheelchair bound but thought that he could get up and grab a pack of cigarettes off of the dresser. He had a witnessed fall hitting his faceon a metal cabinet. He denies loss of consciousness. He is afebrile and vital signs are unremarkable here. He has a minimal headache. Neuro exam otherwise intact. Right-sided deficits consistent with baseline. Will get CT of head and Tylenol.  Tdap UTD.   3:13 PM: I interpreted/reviewed the labs and/or imaging which were non-contributory.  Repaired superficial lacs w/ dermabond. I have discussed the diagnosis/risks/treatment options with the patient and believe the pt to be eligible for discharge home to follow-up with his pcp as needed. We also discussed returning to the ED immediately if new or worsening sx occur. We discussed the sx which are most concerning (e.g., severe HA, fever, AMS) that necessitate immediate return. Medications administered to the patient during their visit and any new prescriptions  provided to the patient are listed below.  Medications given during this visit Medications  hydrogen peroxide 3 % external solution (not administered)  acetaminophen (TYLENOL) tablet 650 mg (650 mg Oral Given 12/19/14 1248)    New Prescriptions   No medications on file     Purvis SheffieldForrest Rmoni Keplinger, MD 12/19/14 1531

## 2014-12-19 NOTE — ED Notes (Signed)
Per EMS pt coming from [facility] facility with c/o witnessed fall. Per EMS staff reports pt was getting up from his WC and lost balance and fell face first hitting his head onto metal cabinet. Pt has small abrasions/laceration on his nose, and above eyes.

## 2014-12-19 NOTE — ED Notes (Signed)
Report given to Laurey Arroworothy Blackwell at [facility]. [facility] called for transport.

## 2014-12-19 NOTE — ED Notes (Signed)
EDP at bedside for lac repair. 

## 2015-02-03 ENCOUNTER — Ambulatory Visit: Payer: Medicare Other | Admitting: Adult Health

## 2015-02-23 ENCOUNTER — Encounter: Payer: Self-pay | Admitting: Adult Health

## 2015-02-23 ENCOUNTER — Ambulatory Visit (INDEPENDENT_AMBULATORY_CARE_PROVIDER_SITE_OTHER): Payer: Medicare Other | Admitting: Adult Health

## 2015-02-23 VITALS — BP 103/68 | HR 75 | Ht 65.0 in | Wt 130.0 lb

## 2015-02-23 DIAGNOSIS — Z5181 Encounter for therapeutic drug level monitoring: Secondary | ICD-10-CM | POA: Diagnosis not present

## 2015-02-23 DIAGNOSIS — R569 Unspecified convulsions: Secondary | ICD-10-CM | POA: Diagnosis not present

## 2015-02-23 NOTE — Progress Notes (Addendum)
PATIENT: [name] DOB: [birth year]  REASON FOR VISIT: follow up-seizures HISTORY FROM: patient  HISTORY OF PRESENT ILLNESS: [name] is a 69 year old male with a history of cerebrovascular disease, right hemiparesis and seizures. He returns today for follow-up. The patient is currently taking Dilantin 200 mg at bedtime. The patient is prescribed Zonegran but he is unsure if he is taking this or not. The [facility] a [facility]. There was no paperwork sent with the patient. The patient denies any seizure events since the last visit. The patient ambulates with a wheelchair. He is able to complete all ADLs with minimal assistance. He denies any new neurological symptoms. She comes in today for an evaluation.  HISTORY 07/21/14: [name] is a 69 year old male with a history of cerebrovascular disease, right hemiparesis and seizures. He returns today for follow-up. The patient is currently taking Dilantin 200 mg at bedtim and Zonegran . He reports that he is not had a seizure since the last visit. The patient denies any changes in his balance or gait. He uses a cane to ambulate but most of the time will use a wheelchair.. The patient is able to complete ADLs with some assistance. The patient lives at [facility] facility.   HISTORY 05/19/14 ([name]): 69 year old left-handed black male with a history of cerebrovascular disease, and a0 right hemiparesis. The patient has intractable seizures, and he takes Keppra and Dilantin. The patient has had issues with Dilantin toxicity on 300 mg daily, and his blood levels of Dilantin are in the low therapeutic range on 200 mg daily. The patient has had multiple emergency room visits for falls and for seizures. The patient was seen on 10/28/2013 and on 01/29/2014 for seizures. The patient was seen on 12/04/2013, seventh of August 2015, and 10th of July 2015 for falls requiring a visit to the emergency room. The  patient has a severe gait disorder, and he oftentimes will not ask for help to get to the bathroom and back. The patient uses a cane only for ambulation. The patient otherwise has not had any new medical issues that have come up since last seen.  REVIEW OF SYSTEMS: Out of a complete 14 system review of symptoms, the patient complains only of the following symptoms, and all other reviewed systems are negative.  See history of present illness  ALLERGIES: Allergies  Allergen Reactions  . Carbamazepine Other (See Comments)    Unknown on MAR  . Tricyclic Antidepressants Other (See Comments)    Unknown on [facility]    HOME MEDICATIONS: Outpatient Prescriptions Prior to Visit  Medication Sig Dispense Refill  . acetaminophen (TYLENOL) 325 MG tablet Take 650 mg by mouth every morning.    Marland Kitchen acetaminophen (TYLENOL) 650 MG CR tablet Take 650 mg by mouth every 8 (eight) hours as needed for pain.    Marland Kitchen aspirin EC 81 MG tablet Take 81 mg by mouth daily.    . Cholecalciferol (VITAMIN D3) 2000 UNITS TABS Take 4,000 Units by mouth daily.    . DULoxetine (CYMBALTA) 30 MG capsule Take 30 mg by mouth at bedtime.     . ferrous sulfate 325 (65 FE) MG tablet Take 325 mg by mouth 2 (two) times daily.     Marland Kitchen gabapentin (NEURONTIN) 600 MG tablet Take 600 mg by mouth 2 (two) times daily.    . hydrocortisone cream 1 % Apply 1 application topically every 6 (six) hours as needed for itching.    Marland Kitchen  latanoprost (XALATAN) 0.005 % ophthalmic solution Place 1 drop into both eyes at bedtime.    . levETIRAcetam (KEPPRA) 1000 MG tablet Take 1 tablet (1,000 mg total) by mouth 2 (two) times daily. 60 tablet 3  . lisinopril (PRINIVIL,ZESTRIL) 20 MG tablet Take 20 mg by mouth daily.    . Multiple Vitamins-Iron (MULTIVITAMINS WITH IRON) TABS Take 1 tablet by mouth daily.    . Nutritional Supplements (NUTRITIONAL SHAKE PO) Take 1 each by mouth 3 (three) times daily with meals. MIGHTY SHAKES    . ondansetron (ZOFRAN) 4 MG tablet Take 4 mg  by mouth every 8 (eight) hours as needed for nausea or vomiting.    . phenytoin (DILANTIN) 100 MG ER capsule Take 1 capsule (100 mg total) by mouth every evening.    . vitamin C (ASCORBIC ACID) 500 MG tablet Take 500 mg by mouth 2 (two) times daily.    Marland Kitchen. zonisamide (ZONEGRAN) 50 MG capsule One tablet daily for one week, then one tablet twice a day for one week, then take one tablet in the morning and two tablets in the evening for one week, then take 2 tablets twice a day (Patient taking differently: Take 100 mg by mouth 2 (two) times daily. ) 120 capsule 1   No facility-administered medications prior to visit.    PAST MEDICAL HISTORY: Past Medical History  Diagnosis Date  . Hypertension   . SDH (subdural hematoma)   . CVA (cerebral infarction)   . History of craniotomy   . Seizures   . Organic brain syndrome   . Dilantin toxicity   . Right hemiparesis   . Pneumonia   . Polysubstance abuse   . Gait disorder 05/19/2014    PAST SURGICAL HISTORY: Past Surgical History  Procedure Laterality Date  . Craniotomy      following head trauma  . Colonoscopy with propofol N/A 12/15/2014    Procedure: COLONOSCOPY WITH PROPOFOL;  Surgeon: Charlott RakesVincent Schooler, MD;  [facility];  Service: Endoscopy;  Laterality: N/A;    FAMILY HISTORY: Family History  Problem Relation Age of Onset  . Heart attack Mother   . Heart attack Father     SOCIAL HISTORY: History   Social History  . Marital Status: Divorced    Spouse Name: N/A  . Number of Children: 1  . Years of Education: 12   Occupational History  . retired    Social History Main Topics  . Smoking status: Current Every Day Smoker -- 1.00 packs/day  . Smokeless tobacco: Never Used  . Alcohol Use: No  . Drug Use: No  . Sexual Activity: No   Other Topics Concern  . Not on file   Social History Narrative   [facility].    Retired.   Education 12 th grade.   Left handed.      PHYSICAL EXAM  Filed  Vitals:   02/23/15 1300  BP: 103/68  Pulse: 75  Height: 5\' 5"  (1.651 m)  Weight: 130 lb (58.968 kg)   Body mass index is 21.63 kg/(m^2).  Generalized: Well developed, in no acute distress   Neurological examination  Mentation: Alert oriented to time, place, history taking. Follows all commands speech and language fluent Cranial nerve II-XII: Pupils were equal round reactive to light. Extraocular movements were full, visual field were full on confrontational test. Facial sensation and strength were normal. Uvula tongue midline. Head turning and shoulder shrug  were normal and symmetric. Motor: The motor testing reveals  5 out of 5 strength in the left upper and lower extremities. 3 out of 5 strength in the right upper and lower extremities.Peri Jefferson symmetric motor tone is noted throughout.  Sensory: Sensory testing is intact to soft touch on all 4 extremities except decreased on the right. No evidence of extinction is noted.  Coordination: Cerebellar testing reveals good finger-nose-finger and heel-to-shin on the left. Difficulty on the right due to weakness. Gait and station: Patient is in a wheelchair today. He did not bring a walker. Reflexes: Deep tendon reflexes are symmetric and normal bilaterally.   DIAGNOSTIC DATA (LABS, IMAGING, TESTING) - I reviewed patient records, labs, notes, testing and imaging myself where available.     ASSESSMENT AND PLAN 69 y.o. year old male  has a past medical history of Hypertension; SDH (subdural hematoma); CVA (cerebral infarction); History of craniotomy; Seizures; Organic brain syndrome; Dilantin toxicity; Right hemiparesis; Pneumonia; Polysubstance abuse; and Gait disorder (05/19/2014). here with:  1. Seizures  Overall the patient is doing well. He has not had any seizure event since the last visit. He will continue on Dilantin. The patient has been prescribed Zonegran but he is unsure if he is taking this. I will check blood work today- including  a Zonegran level to ensure the patient is taking this medication. If he has any  seizure events he should let us know. He will follow-up in 6 months or sooner if needed.  Butch Penny, MSN, NP-C 02/23/2015, 1:14 PM [facility] [address], [address] [place], [place] [address] [phone]  Note: This document was prepared with digital dictation and possible smart phrase technology. Any transcriptional errors that result from this process are unintentional.

## 2015-02-23 NOTE — Progress Notes (Signed)
I have read the note, and I agree with the clinical assessment and plan.  [name],CHARLES KEITH   

## 2015-02-23 NOTE — Patient Instructions (Signed)
Will check blood work today. Continue Dilantin.

## 2015-02-24 ENCOUNTER — Telehealth: Payer: Self-pay

## 2015-02-24 LAB — COMPREHENSIVE METABOLIC PANEL
ALBUMIN: 4.3 g/dL (ref 3.6–4.8)
ALT: 12 IU/L (ref 0–44)
AST: 15 IU/L (ref 0–40)
Albumin/Globulin Ratio: 2 (ref 1.1–2.5)
Alkaline Phosphatase: 132 IU/L — ABNORMAL HIGH (ref 39–117)
BUN/Creatinine Ratio: 18 (ref 10–22)
BUN: 16 mg/dL (ref 8–27)
Bilirubin Total: <0.2 mg/dL (ref 0.0–1.2)
CALCIUM: 8.9 mg/dL (ref 8.6–10.2)
CO2: 21 mmol/L (ref 18–29)
CREATININE: 0.87 mg/dL (ref 0.76–1.27)
Chloride: 101 mmol/L (ref 97–108)
GFR calc non Af Amer: 89 mL/min/{1.73_m2} (ref >59)
GFR, EST AFRICAN AMERICAN: 103 mL/min/{1.73_m2} (ref >59)
Globulin, Total: 2.2 g/dL (ref 1.5–4.5)
Glucose: 88 mg/dL (ref 65–99)
Potassium: 4.5 mmol/L (ref 3.5–5.2)
SODIUM: 136 mmol/L (ref 134–144)
TOTAL PROTEIN: 6.5 g/dL (ref 6.0–8.5)

## 2015-02-24 LAB — CBC WITH DIFFERENTIAL/PLATELET
Basophils Absolute: 0 10*3/uL (ref 0.0–0.2)
Basos: 0 %
EOS (ABSOLUTE): 0.2 10*3/uL (ref 0.0–0.4)
Eos: 4 %
Hematocrit: 34.2 % — ABNORMAL LOW (ref 37.5–51.0)
Hemoglobin: 10.4 g/dL — ABNORMAL LOW (ref 12.6–17.7)
Immature Grans (Abs): 0 10*3/uL (ref 0.0–0.1)
Immature Granulocytes: 0 %
LYMPHS ABS: 1.9 10*3/uL (ref 0.7–3.1)
Lymphs: 36 %
MCH: 23.1 pg — ABNORMAL LOW (ref 26.6–33.0)
MCHC: 30.4 g/dL — ABNORMAL LOW (ref 31.5–35.7)
MCV: 76 fL — AB (ref 79–97)
MONOCYTES: 8 %
Monocytes Absolute: 0.4 10*3/uL (ref 0.1–0.9)
NEUTROS ABS: 2.7 10*3/uL (ref 1.4–7.0)
Neutrophils: 52 %
PLATELETS: 306 10*3/uL (ref 150–379)
RBC: 4.51 x10E6/uL (ref 4.14–5.80)
RDW: 17.8 % — ABNORMAL HIGH (ref 12.3–15.4)
WBC: 5.2 10*3/uL (ref 3.4–10.8)

## 2015-02-24 LAB — ZONISAMIDE LEVEL: Zonisamide: 14.1 ug/mL (ref 10.0–40.0)

## 2015-02-24 LAB — PHENYTOIN LEVEL, TOTAL: Phenytoin (Dilantin), Serum: 3.2 ug/mL — ABNORMAL LOW (ref 10.0–20.0)

## 2015-02-24 NOTE — Telephone Encounter (Signed)
-----   Message from Butch Penny, NP sent at 02/24/2015 12:29 PM EDT ----- Lab work is ok. Patient was unsure if nursing facility was giving him zonegran and they are. Continue both Dilantin and Zonegran. Please call patient.

## 2015-02-24 NOTE — Telephone Encounter (Signed)
Called and spoke to [facility] and relayed patient lab work was ok and patient should be taking Zonegran and Dilantin Deanna Artis was already aware of this and will continue medication.

## 2015-08-31 ENCOUNTER — Ambulatory Visit: Payer: Medicare Other | Admitting: Adult Health

## 2015-09-06 ENCOUNTER — Encounter: Payer: Self-pay | Admitting: Adult Health

## 2015-09-06 ENCOUNTER — Ambulatory Visit (INDEPENDENT_AMBULATORY_CARE_PROVIDER_SITE_OTHER): Payer: Medicare Other | Admitting: Adult Health

## 2015-09-06 VITALS — BP 107/70 | HR 72 | Ht 65.0 in

## 2015-09-06 DIAGNOSIS — R569 Unspecified convulsions: Secondary | ICD-10-CM

## 2015-09-06 DIAGNOSIS — Z5181 Encounter for therapeutic drug level monitoring: Secondary | ICD-10-CM

## 2015-09-06 NOTE — Progress Notes (Signed)
I have read the note, and I agree with the clinical assessment and plan.  Tanae Petrosky KEITH   

## 2015-09-06 NOTE — Progress Notes (Signed)
PATIENT: [name] DOB: 1945-12-29  REASON FOR VISIT: follow up- cerebrovascular disease, right hemiparesis, seizures HISTORY FROM: patient  HISTORY OF PRESENT ILLNESS: Mr. [name] is a 70 year old male with a history of cerebrovascular disease, right hemiparesis and seizures. He returns today for follow-up. He continues to take Dilantin and Zonegran. He states that he is tolerating these medications well. He denies any seizure events. He continues to recite at [facility]. He states that he enjoys it there. He requires assistance with his ADLs. He states that he sleeps well there. He also smokes cigarettes. He denies any new neurological symptoms. He returns today for an evaluation.  HISTORY 02/23/15: Mr. [name] is a 70 year old male with a history of cerebrovascular disease, right hemiparesis and seizures. He returns today for follow-up. The patient is currently taking Dilantin 200 mg at bedtime. The patient is prescribed Zonegran but he is unsure if he is taking this or not. The patient is a resident of [facility]. There was no paperwork sent with the patient. The patient denies any seizure events since the last visit. The patient ambulates with a wheelchair. He is able to complete all ADLs with minimal assistance. He denies any new neurological symptoms. She comes in today for an evaluation.  REVIEW OF SYSTEMS: Out of a complete 14 system review of symptoms, the patient complains only of the following symptoms, and all other reviewed systems are negative.  See history of present illness  ALLERGIES: Allergies  Allergen Reactions  . Carbamazepine Other (See Comments)    Unknown on MAR  . Tricyclic Antidepressants Other (See Comments)    Unknown on [facility]    HOME MEDICATIONS: Outpatient Prescriptions Prior to Visit  Medication Sig Dispense Refill  . acetaminophen (TYLENOL) 325 MG tablet Take 650 mg by mouth every morning.    Marland Kitchen  acetaminophen (TYLENOL) 650 MG CR tablet Take 650 mg by mouth every 8 (eight) hours as needed for pain.    Marland Kitchen aspirin EC 81 MG tablet Take 81 mg by mouth daily.    . Cholecalciferol (VITAMIN D3) 2000 UNITS TABS Take 4,000 Units by mouth daily.    . DULoxetine (CYMBALTA) 30 MG capsule Take 30 mg by mouth at bedtime.     . ferrous sulfate 325 (65 FE) MG tablet Take 325 mg by mouth 2 (two) times daily.     Marland Kitchen gabapentin (NEURONTIN) 600 MG tablet Take 600 mg by mouth 2 (two) times daily.    . hydrocortisone cream 1 % Apply 1 application topically every 6 (six) hours as needed for itching.    . latanoprost (XALATAN) 0.005 % ophthalmic solution Place 1 drop into both eyes at bedtime.    . levETIRAcetam (KEPPRA) 1000 MG tablet Take 1 tablet (1,000 mg total) by mouth 2 (two) times daily. 60 tablet 3  . lisinopril (PRINIVIL,ZESTRIL) 20 MG tablet Take 20 mg by mouth daily.    . Multiple Vitamins-Iron (MULTIVITAMINS WITH IRON) TABS Take 1 tablet by mouth daily.    . Nutritional Supplements (NUTRITIONAL SHAKE PO) Take 1 each by mouth 3 (three) times daily with meals. MIGHTY SHAKES    . ondansetron (ZOFRAN) 4 MG tablet Take 4 mg by mouth every 8 (eight) hours as needed for nausea or vomiting.    . phenytoin (DILANTIN) 100 MG ER capsule Take 1 capsule (100 mg total) by mouth every evening.    . vitamin C (ASCORBIC ACID) 500 MG tablet Take 500 mg by mouth 2 (  two) times daily.    Marland Kitchen zonisamide (ZONEGRAN) 50 MG capsule One tablet daily for one week, then one tablet twice a day for one week, then take one tablet in the morning and two tablets in the evening for one week, then take 2 tablets twice a day (Patient taking differently: Take 100 mg by mouth 2 (two) times daily. ) 120 capsule 1   No facility-administered medications prior to visit.    PAST MEDICAL HISTORY: Past Medical History  Diagnosis Date  . Hypertension   . SDH (subdural hematoma) (HCC)   . CVA (cerebral infarction)   . History of craniotomy   .  Seizures (HCC)   . Organic brain syndrome   . Dilantin toxicity   . Right hemiparesis (HCC)   . Pneumonia   . Polysubstance abuse   . Gait disorder 05/19/2014    PAST SURGICAL HISTORY: Past Surgical History  Procedure Laterality Date  . Craniotomy      following head trauma  . Colonoscopy with propofol N/A 12/15/2014    Procedure: COLONOSCOPY WITH PROPOFOL;  Surgeon: Charlott Rakes, MD;  Location: [facility] ENDOSCOPY;  Service: Endoscopy;  Laterality: N/A;    FAMILY HISTORY: Family History  Problem Relation Age of Onset  . Heart attack Mother   . Heart attack Father     SOCIAL HISTORY: Social History   Social History  . Marital Status: Divorced    Spouse Name: N/A  . Number of Children: 1  . Years of Education: 12   Occupational History  . retired    Social History Main Topics  . Smoking status: Current Every Day Smoker -- 1.00 packs/day  . Smokeless tobacco: Never Used  . Alcohol Use: No  . Drug Use: No  . Sexual Activity: No   Other Topics Concern  . Not on file   Social History Narrative   Patient lives at [facility].    Retired.   Education 12 th grade.   Left handed.      PHYSICAL EXAM  Filed Vitals:   09/06/15 1051  BP: 107/70  Pulse: 72  Height:  (1.651 m)   There is no weight on file to calculate BMI.  Generalized: Well developed, in no acute distress   Neurological examination  Mentation: Alert oriented to time, place, history taking. Follows all commands speech and language fluent Cranial nerve II-XII: Pupils were equal round reactive to light. Extraocular movements were full, visual field were full on confrontational test. Facial sensation and strength were normal. Uvula tongue midline. Head turning and shoulder shrug  were normal and symmetric. Motor: The motor testing reveals 5 over 5 strength in the left upper and lower extremity. 4 out of 5 strength in the right upper and lower extremity.Peri Jefferson symmetric motor tone is noted  throughout.  Sensory: Sensory testing is intact to soft touch on all 4 extremities. No evidence of extinction is noted.  Coordination: Cerebellar testing reveals good finger-nose-finger and heel-to-shin on the left. Difficulty on the right due to hemiparesis. Gait and station: Patient is in a wheelchair. Reflexes: Deep tendon reflexes are symmetric and normal bilaterally.   DIAGNOSTIC DATA (LABS, IMAGING, TESTING) - I reviewed patient records, labs, notes, testing and imaging myself where available.  Lab Results  Component Value Date   WBC 5.2 02/23/2015   HGB 10.1* 07/21/2014   HCT 34.2* 02/23/2015   MCV 76* 02/23/2015   PLT 306 02/23/2015      Component Value Date/Time   NA 136  02/23/2015 1352   NA 139 02/12/2014 1602   K 4.5 02/23/2015 1352   CL 101 02/23/2015 1352   CO2 21 02/23/2015 1352   GLUCOSE 88 02/23/2015 1352   GLUCOSE 84 02/12/2014 1602   BUN 16 02/23/2015 1352   BUN 20 02/12/2014 1602   CREATININE 0.87 02/23/2015 1352   CALCIUM 8.9 02/23/2015 1352   PROT 6.5 02/23/2015 1352   ALBUMIN 4.3 02/23/2015 1352   AST 15 02/23/2015 1352   ALT 12 02/23/2015 1352   ALKPHOS 132* 02/23/2015 1352   BILITOT <0.2 02/23/2015 1352   BILITOT <0.2 07/21/2014 1132   GFRNONAA 89 02/23/2015 1352   GFRAA 103 02/23/2015 1352      ASSESSMENT AND PLAN 70 y.o. year old male  has a past medical history of Hypertension; SDH (subdural hematoma) (HCC); CVA (cerebral infarction); History of craniotomy; Seizures (HCC); Organic brain syndrome; Dilantin toxicity; Right hemiparesis (HCC); Pneumonia; Polysubstance abuse; and Gait disorder (05/19/2014). here with:  1. Seizures  Overall the patient is doing well. He will continue on Dilantin and Zonegran. I will check blood work today. Patient advised that if he has any additional seizure events he should let us know. He will follow-up in 6 months with Dr. Anne Hahn.     Butch Penny, MSN, NP-C 09/06/2015, 11:06 AM [facility] [address] [place], [place] [address] [phone]

## 2015-09-06 NOTE — Patient Instructions (Signed)
Continue Dilantin and zonegran Blood work today If your symptoms worsen or you develop new symptoms please let us know.

## 2015-09-07 ENCOUNTER — Telehealth: Payer: Self-pay

## 2015-09-07 LAB — COMPREHENSIVE METABOLIC PANEL
A/G RATIO: 1.5 (ref 1.1–2.5)
ALT: 9 IU/L (ref 0–44)
AST: 15 IU/L (ref 0–40)
Albumin: 4 g/dL (ref 3.6–4.8)
Alkaline Phosphatase: 109 IU/L (ref 39–117)
BUN / CREAT RATIO: 21 (ref 10–22)
BUN: 21 mg/dL (ref 8–27)
CHLORIDE: 106 mmol/L (ref 96–106)
CO2: 23 mmol/L (ref 18–29)
Calcium: 8.8 mg/dL (ref 8.6–10.2)
Creatinine, Ser: 1.02 mg/dL (ref 0.76–1.27)
GFR calc Af Amer: 88 mL/min/{1.73_m2} (ref >59)
GFR calc non Af Amer: 76 mL/min/{1.73_m2} (ref >59)
GLUCOSE: 80 mg/dL (ref 65–99)
Globulin, Total: 2.6 g/dL (ref 1.5–4.5)
Potassium: 4.6 mmol/L (ref 3.5–5.2)
SODIUM: 143 mmol/L (ref 134–144)
TOTAL PROTEIN: 6.6 g/dL (ref 6.0–8.5)

## 2015-09-07 LAB — CBC WITH DIFFERENTIAL/PLATELET
BASOS ABS: 0 10*3/uL (ref 0.0–0.2)
Basos: 1 %
EOS (ABSOLUTE): 0.3 10*3/uL (ref 0.0–0.4)
EOS: 6 %
HEMATOCRIT: 31.6 % — AB (ref 37.5–51.0)
Hemoglobin: 10.1 g/dL — ABNORMAL LOW (ref 12.6–17.7)
Immature Grans (Abs): 0 10*3/uL (ref 0.0–0.1)
Immature Granulocytes: 0 %
LYMPHS ABS: 2 10*3/uL (ref 0.7–3.1)
Lymphs: 37 %
MCH: 23.4 pg — AB (ref 26.6–33.0)
MCHC: 32 g/dL (ref 31.5–35.7)
MCV: 73 fL — ABNORMAL LOW (ref 79–97)
MONOS ABS: 0.6 10*3/uL (ref 0.1–0.9)
Monocytes: 11 %
NEUTROS ABS: 2.4 10*3/uL (ref 1.4–7.0)
Neutrophils: 45 %
Platelets: 252 10*3/uL (ref 150–379)
RBC: 4.32 x10E6/uL (ref 4.14–5.80)
RDW: 16.5 % — AB (ref 12.3–15.4)
WBC: 5.3 10*3/uL (ref 3.4–10.8)

## 2015-09-07 LAB — ZONISAMIDE LEVEL: Zonisamide: 8.6 ug/mL — ABNORMAL LOW (ref 10.0–40.0)

## 2015-09-07 LAB — PHENYTOIN LEVEL, TOTAL: PHENYTOIN (DILANTIN), SERUM: 0.8 ug/mL — AB (ref 10.0–20.0)

## 2015-09-07 NOTE — Telephone Encounter (Signed)
Spoke to Counselling psychologist on patient's care unit at [facility]. Will fax lab results per [facility] care manager. Faxed to [phone].

## 2015-09-07 NOTE — Telephone Encounter (Signed)
-----   Message from Butch Penny, NP sent at 09/07/2015  3:07 PM EST ----- Lab work ok. Patient has not had any additional seizures

## 2016-03-05 ENCOUNTER — Ambulatory Visit: Payer: Medicare Other | Admitting: Neurology

## 2016-03-05 ENCOUNTER — Telehealth: Payer: Self-pay

## 2016-03-05 NOTE — Telephone Encounter (Signed)
Pt no-showed his follow-up appt today. 

## 2016-03-07 ENCOUNTER — Encounter: Payer: Self-pay | Admitting: Neurology

## 2016-07-03 ENCOUNTER — Encounter (HOSPITAL_COMMUNITY): Payer: Self-pay | Admitting: *Deleted

## 2016-07-03 ENCOUNTER — Emergency Department (HOSPITAL_COMMUNITY)
Admission: EM | Admit: 2016-07-03 | Discharge: 2016-07-04 | Disposition: A | Discharge status: Home / Self Care | Payer: Medicare Other | Attending: Emergency Medicine | Admitting: Emergency Medicine

## 2016-07-03 DIAGNOSIS — G40909 Epilepsy, unspecified, not intractable, without status epilepticus: Secondary | ICD-10-CM | POA: Insufficient documentation

## 2016-07-03 DIAGNOSIS — F17200 Nicotine dependence, unspecified, uncomplicated: Secondary | ICD-10-CM | POA: Diagnosis not present

## 2016-07-03 DIAGNOSIS — Z8673 Personal history of transient ischemic attack (TIA), and cerebral infarction without residual deficits: Secondary | ICD-10-CM | POA: Insufficient documentation

## 2016-07-03 DIAGNOSIS — Z7982 Long term (current) use of aspirin: Secondary | ICD-10-CM | POA: Diagnosis not present

## 2016-07-03 DIAGNOSIS — Z79899 Other long term (current) drug therapy: Secondary | ICD-10-CM | POA: Insufficient documentation

## 2016-07-03 DIAGNOSIS — I10 Essential (primary) hypertension: Secondary | ICD-10-CM | POA: Insufficient documentation

## 2016-07-03 LAB — I-STAT CHEM 8, ED
BUN: 21 mg/dL — ABNORMAL HIGH (ref 6–20)
CREATININE: 0.8 mg/dL (ref 0.61–1.24)
Calcium, Ion: 1.15 mmol/L (ref 1.15–1.40)
Chloride: 107 mmol/L (ref 101–111)
Glucose, Bld: 97 mg/dL (ref 65–99)
HEMATOCRIT: 35 % — AB (ref 39.0–52.0)
HEMOGLOBIN: 11.9 g/dL — AB (ref 13.0–17.0)
POTASSIUM: 3.6 mmol/L (ref 3.5–5.1)
SODIUM: 143 mmol/L (ref 135–145)
TCO2: 25 mmol/L (ref 0–100)

## 2016-07-03 LAB — PHENYTOIN LEVEL, TOTAL

## 2016-07-03 LAB — CBG MONITORING, ED: Glucose-Capillary: 92 mg/dL (ref 65–99)

## 2016-07-03 MED ORDER — PHENYTOIN SODIUM EXTENDED 100 MG PO CAPS: 400.0000 mg | ORAL_CAPSULE | Freq: Once | ORAL | Status: DC

## 2016-07-03 MED ORDER — PHENYTOIN SODIUM EXTENDED 100 MG PO CAPS: 300.0000 mg | ORAL_CAPSULE | ORAL | Status: DC

## 2016-07-03 MED ORDER — SODIUM CHLORIDE 0.9 % IV SOLN
1000.0000 mg | Freq: Once | INTRAVENOUS | Status: AC
  Administered 2016-07-03: 1000 mg via INTRAVENOUS
  Filled 2016-07-03: qty 20

## 2016-07-03 NOTE — ED Triage Notes (Signed)
Per EMS, [facility], initially they were called for transport assist, upon arrival to the facility staff reports pt was assisted to the floor from a chair d/t a seizure episode.  Pt is A&O per norm.

## 2016-07-03 NOTE — ED Notes (Signed)
IV team unable to obtain IV. Main lab is not able to draw blood at this time. PA made aware.

## 2016-07-03 NOTE — ED Provider Notes (Signed)
[facility]-EMERGENCY DEPT Provider Note   CSN: [id number] Arrival date & time: 07/03/16  1809   By signing my name below, I, Modena JanskyAlbert Thayil, attest that this documentation has been prepared under the direction and in the presence of non-physician practitioner, Antony MaduraKelly Lexandra Rettke, PA-C. Electronically Signed: Modena JanskyAlbert Thayil, Scribe. 07/03/2016. 8:30 PM.  History   Chief Complaint Chief Complaint  Patient presents with  . Seizures   The history is provided by the patient and medical records. No language interpreter was used.  Seizures   This is a chronic problem. The current episode started 3 to 5 hours ago. The problem has been gradually improving. There was 1 seizure. Pertinent negatives include no visual disturbance and no nausea. The episode was witnessed. Possible causes include medication or dosage change.   HPI Comments: [name] is a 70 y.o. male with a hx of seizures who presents to the Emergency Department complaining of a seizure that started today ago. Per nurse's note, pt was [facility] assist when pt started having a seizure at the facility. Pt states that he cannot recall his seizure episode, but feels as though be bit his tongue and is currently slightly disoriented. He reports today has been his first seizure since his neurologist increased his Dilantin dose. He reports associated constant moderate headache "where my plates are". He states that he has had similar headache in the past which was treated with Phenobarbital. He admits to being in a wheelchair and having right-sided weakness at baseline. He denies any nausea, abdominal pain, visual disturbance, photophobia, or other complatints.   PCP: Vinnie LangtonIBRAHIM, SADAOU, NP   Past Medical History:  Diagnosis Date  . CVA (cerebral infarction)   . Dilantin toxicity   . 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 History of craniotomy   . Hypertension   . Organic brain syndrome   . Pneumonia   . Polysubstance abuse     . Right hemiparesis (HCC)   . SDH (subdural hematoma) (HCC)   . Seizures [facility](HCC)     Patient Active Problem List   Diagnosis Date Noted  . Colon cancer screening 12/15/2014  . 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 Seizure disorder (HCC) 05/20/2013  . Hemiparesis affecting dominant side as late effect of stroke (HCC) 05/20/2013  . Tobacco use disorder 05/20/2013    Past Surgical History:  Procedure Laterality Date  . COLONOSCOPY WITH PROPOFOL N/A 12/15/2014   Procedure: COLONOSCOPY WITH PROPOFOL;  Surgeon: Charlott RakesVincent Schooler, MD;  [facility];  Service: Endoscopy;  Laterality: N/A;  . CRANIOTOMY     following head trauma       Home Medications    Prior to Admission medications   Medication Sig Start Date End Date Taking? Authorizing Provider  acetaminophen (TYLENOL) 325 MG tablet Take 650 mg by mouth every morning.   Yes Historical Provider, MD  acetaminophen (TYLENOL) 650 MG CR tablet Take 650 mg by mouth every 8 (eight) hours as needed for pain.   Yes Historical Provider, MD  aspirin EC 81 MG tablet Take 81 mg by mouth daily.   Yes Historical Provider, MD  Cholecalciferol (VITAMIN D3) 2000 UNITS TABS Take 4,000 Units by mouth at bedtime.    Yes Historical Provider, MD  donepezil (ARICEPT) 5 MG tablet Take 5 mg by mouth at bedtime.  08/15/15  Yes Historical Provider, MD  DULoxetine (CYMBALTA) 30 MG capsule Take 30 mg by mouth at bedtime.    Yes Historical Provider, MD  ferrous sulfate 325 (65  FE) MG tablet Take 325 mg by mouth 2 (two) times daily.    Yes Historical Provider, MD  gabapentin (NEURONTIN) 600 MG tablet Take 600 mg by mouth 2 (two) times daily.   Yes Historical Provider, MD  hydrocortisone cream 1 % Apply 1 application topically every 6 (six) hours as needed for itching.   Yes Historical Provider, MD  latanoprost (XALATAN) 0.005 % ophthalmic solution Place 1 drop into both eyes at bedtime.   Yes Historical Provider, MD  levETIRAcetam (KEPPRA) 1000 MG tablet Take 1  tablet (1,000 mg total) by mouth 2 (two) times daily. 05/19/14  Yes York Spanielharles K [name], MD  lisinopril (PRINIVIL,ZESTRIL) 20 MG tablet Take 20 mg by mouth daily.   Yes Historical Provider, MD  mirtazapine (REMERON) 7.5 MG tablet Take 7.5 mg by mouth daily.    Yes Historical Provider, MD  Multiple Vitamins-Iron (MULTIVITAMINS WITH IRON) TABS Take 1 tablet by mouth daily.   Yes Historical Provider, MD  phenytoin (DILANTIN) 100 MG ER capsule Take 1 capsule (100 mg total) by mouth every evening. 07/22/14  Yes Butch PennyMegan Millikan, NP  triamcinolone cream (KENALOG) 0.1 % Apply 1 application topically 2 (two) times daily.   Yes Historical Provider, MD  vitamin C (ASCORBIC ACID) 500 MG tablet Take 500 mg by mouth 2 (two) times daily.   Yes Historical Provider, MD  zonisamide (ZONEGRAN) 50 MG capsule One tablet daily for one week, then one tablet twice a day for one week, then take one tablet in the morning and two tablets in the evening for one week, then take 2 tablets twice a day Patient taking differently: Take 100 mg by mouth 2 (two) times daily.  05/19/14  Yes York Spanielharles K [name], MD    Family History Family History  Problem Relation Age of Onset  . Heart attack Mother   . Heart attack Father     Social History Social History  Substance Use Topics  . Smoking status: Current Every Day Smoker    Packs/day: 1.00  . Smokeless tobacco: Never Used  . Alcohol use No     Allergies   Carbamazepine and Tricyclic antidepressants   Review of Systems Review of Systems  Eyes: Negative for photophobia and visual disturbance.  Gastrointestinal: Negative for abdominal pain and nausea.  Neurological: Positive for seizures.  All other systems reviewed and are negative.    Physical Exam Updated Vital Signs BP 140/73 (BP Location: Right Arm)   Pulse 94   Temp 99.9 F (37.7 C) (Oral)   Resp 17   SpO2 97%   Physical Exam  Constitutional: He is oriented to person, place, and time. He appears  well-developed and well-nourished. No distress.  Nontoxic and in no distress  HENT:  Head: Normocephalic and atraumatic.  Bite to left lateral tongue; no bleeding.  Eyes: Conjunctivae and EOM are normal. No scleral icterus.  Neck: Normal range of motion.  Cardiovascular: Normal rate, regular rhythm and intact distal pulses.   Pulmonary/Chest: Effort normal. No respiratory distress. He has no wheezes.  Respirations even and unlabored  Abdominal: Soft. He exhibits no distension. There is no tenderness. There is no guarding.  Soft, nontender, nondistended abdomen  Musculoskeletal: Normal range of motion.  Neurological: He is alert and oriented to person, place, and time. No cranial nerve deficit. He exhibits abnormal muscle tone.  GCS 15. Speech is goal oriented. No cranial nerve deficits appreciated; symmetric eyebrow raise, no facial drooping, tongue midline. Patient has normal left grip strength with 5/5 strength  against resistance in all major muscle groups in the LUE and LLE. Strength 3/5 in the RUE and RLE. Patient reports this is his baseline. He reports decreased sensation in the RUE and RLE; also at baseline. Sensation to light touch normal in the LUE and LLE.  Skin: Skin is warm and dry. No rash noted. He is not diaphoretic. No erythema. No pallor.  Psychiatric: He has a normal mood and affect. His behavior is normal.  Nursing note and vitals reviewed.    ED Treatments / Results  DIAGNOSTIC STUDIES: Oxygen Saturation is 97% on RA, normal by my interpretation.    COORDINATION OF CARE: 8:35 PM- Pt advised of plan for treatment and pt agrees.  Labs (all labs ordered are listed, but only abnormal results are displayed) Labs Reviewed  PHENYTOIN LEVEL, TOTAL - Abnormal; Notable for the following:       Result Value   Phenytoin Lvl <2.5 (*)    All other components within normal limits  I-STAT CHEM 8, ED - Abnormal; Notable for the following:    BUN 21 (*)    Hemoglobin 11.9 (*)     HCT 35.0 (*)    All other components within normal limits  LEVETIRACETAM LEVEL  CBG MONITORING, ED    EKG  EKG Interpretation  Date/Time:  Tuesday July 03 2016 18:24:30 EST Ventricular Rate:  95 PR Interval:    QRS Duration: 78 QT Interval:  353 QTC Calculation: 444 R Axis:   47 Text Interpretation:  Sinus rhythm Prominent P waves, nondiagnostic Probable right ventricular hypertrophy No significant change since last tracing Confirmed by ZACKOWSKI  MD, SCOTT ([id number]) on 07/03/2016 9:26:04 PM       Radiology No results found.  Procedures Procedures (including critical care time)  Medications Ordered in ED Medications  phenytoin (DILANTIN) 1,000 mg in sodium chloride 0.9 % 250 mL IVPB (0 mg Intravenous Stopped 07/04/16 0129)     Initial Impression / Assessment and Plan / ED Course  I have reviewed the triage vital signs and the nursing notes.  Pertinent labs & imaging results that were available during my care of the patient were reviewed by me and considered in my medical decision making (see chart for details).   Clinical Course     70 year old male with a history of subdural hematoma, CVA, right hemiparesis, and seizures presents for a witnessed seizure prior to arrival. This occurred while the patient was in his wheelchair. He was lowered to the floor and did not hit his head. Per patient and based on exam, he is neurovascularly at baseline. There is evidence of tongue biting on the left.  Patient has had no witnessed seizure activity while in the emergency department. He is subtherapeutic on his Dilantin. Chart documentation references a nightly dose of 100 mg Dilantin. Question whether this may be secondary to Dilantin toxicity. Case discussed with Dr. Roseanne Reno of neurology who recommends that patient continue with 200 mg Dilantin nightly, referenced at his most recent Neurology visit in January. Patient to follow-up with his neurologist regarding his visit to the  emergency department today. Return precautions given at discharge. Patient discharged in stable condition.   Final Clinical Impressions(s) / ED Diagnoses   Final diagnoses:  Seizure disorder [facility])    New Prescriptions Discharge Medication List as of 07/04/2016  2:08 AM      I personally performed the services described in this documentation, which was scribed in my presence. The recorded information has been reviewed and is accurate.  Antony Madura, PA-C 07/04/16 0303    Vanetta Mulders, MD 07/05/16 [phone]

## 2016-07-03 NOTE — ED Triage Notes (Signed)
Pt reports he had a seizure while in his wheelchair and fell off of it.  Pt denies any pain at this time.  Reports he takes Dilantin for his seizure DO.  Pt is A&O x 2 to self and place.

## 2016-07-03 NOTE — ED Notes (Signed)
IV team at bedside 

## 2016-07-03 NOTE — ED Notes (Signed)
Attempted IV stick w/o success with use of US. IV team consulted.

## 2016-07-03 NOTE — ED Notes (Signed)
Attempt to draw labs was unsuccessful.  Nurse informed.

## 2016-07-04 DIAGNOSIS — G40909 Epilepsy, unspecified, not intractable, without status epilepticus: Secondary | ICD-10-CM | POA: Diagnosis not present

## 2016-07-04 NOTE — ED Provider Notes (Signed)
Medical screening examination/treatment/procedure(s) were conducted as a shared visit with non-physician practitioner(s) and myself.  I personally evaluated the patient during the encounter.   EKG Interpretation  Date/Time:  Tuesday July 03 2016 18:24:30 EST Ventricular Rate:  95 PR Interval:    QRS Duration: 78 QT Interval:  353 QTC Calculation: 444 R Axis:   47 Text Interpretation:  Sinus rhythm Prominent P waves, nondiagnostic Probable right ventricular hypertrophy No significant change since last tracing Confirmed by Dmani Mizer  MD, Lafaye Mcelmurry [id number]) on 07/03/2016 9:26:04 PM       Results for orders placed or performed during the hospital encounter of 07/03/16  Dilantin (phenytoin) Level (if patient is taking this medication)  Result Value Ref Range   Phenytoin Lvl <2.5 (L) 10.0 - 20.0 ug/mL  CBG monitoring, ED  Result Value Ref Range   Glucose-Capillary 92 65 - 99 mg/dL  I-stat chem 8, ed  Result Value Ref Range   Sodium 143 135 - 145 mmol/L   Potassium 3.6 3.5 - 5.1 mmol/L   Chloride 107 101 - 111 mmol/L   BUN 21 (H) 6 - 20 mg/dL   Creatinine, Ser 8.460.80 0.61 - 1.24 mg/dL   Glucose, Bld 97 65 - 99 mg/dL   Calcium, Ion 9.621.15 9.521.15 - 1.40 mmol/L   TCO2 25 0 - 100 mmol/L   Hemoglobin 11.9 (L) 13.0 - 17.0 g/dL   HCT 84.135.0 (L) 32.439.0 - 40.152.0 %   No results found.   Patient seen by me along with the physician assistant. [facility]. Patient reportedly at the facility was on the floor assisted from a chair due to a seizure episode. Patient has a history of seizures. Patient also has a history of dementia. Patient based on a medicine list is on Keppra and the Dilantin.  Patient's Dilantin level was low. He received loading dose here. Patient currently stable nontoxic no acute distress. Patient stable for discharge back to nursing facility.  No sig Neuro deficits.   Vanetta MuldersScott Jaramiah Bossard, MD 07/04/16 [phone]

## 2016-07-04 NOTE — ED Notes (Addendum)
Pt taken back to facility by ptar.

## 2016-07-04 NOTE — Discharge Instructions (Signed)
Take 200mg  of Dilantin ER at nighttime daily. Continue all of your other daily medications. Follow up with your neurologist regarding your visit today. Return for new or concerning symptoms.

## 2016-07-06 LAB — LEVETIRACETAM LEVEL: LEVETIRACETAM: NOT DETECTED ug/mL (ref 10.0–40.0)

## 2016-07-20 ENCOUNTER — Encounter: Payer: Self-pay | Admitting: Neurology

## 2016-07-20 ENCOUNTER — Ambulatory Visit (INDEPENDENT_AMBULATORY_CARE_PROVIDER_SITE_OTHER): Payer: Medicare Other | Admitting: Neurology

## 2016-07-20 VITALS — BP 134/79 | HR 59 | Ht 65.0 in | Wt 120.0 lb

## 2016-07-20 DIAGNOSIS — Z5181 Encounter for therapeutic drug level monitoring: Secondary | ICD-10-CM

## 2016-07-20 NOTE — Progress Notes (Signed)
Reason for visit: Seizures  [name] is an 70 y.o. male  History of present illness:  Mr. [name] is a 70 year old black male with a history of a left brain stroke and right hemiparesis. The patient is nonambulatory, he is wheelchair-bound. He is followed through this office with a history of seizures. The patient does not recall when the last time she had a seizure was. He was in the emergency room on 07/03/2016 following a seizure. The patient has been on very low-dose all and taking 100 mg at night, is blood levels were very low in the emergency room. Apparently his Dilantin dose was increased to 200 mg at night. The patient had been on Zonegran when he was seen through this office in January 2017, but apparently his medication regimen was changed. He is off of Zonegran and now on Keppra taking 1000 mg twice daily. The patient is also on Aricept, it is not clear when this was started. The patient returns for further evaluation at this time. He missed his last revisit in July 2017.  Past Medical History:  Diagnosis Date  . CVA (cerebral infarction)   . Dilantin toxicity   . 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 History of craniotomy   . Hypertension   . Organic brain syndrome   . Pneumonia   . Polysubstance abuse   . Right hemiparesis (HCC)   . SDH (subdural hematoma) (HCC)   . Seizures (HCC)     Past Surgical History:  Procedure Laterality Date  . COLONOSCOPY WITH PROPOFOL N/A 12/15/2014   Procedure: COLONOSCOPY WITH PROPOFOL;  Surgeon: Charlott RakesVincent Schooler, MD;  [facility];  Service: Endoscopy;  Laterality: N/A;  . CRANIOTOMY     following head trauma    Family History  Problem Relation Age of Onset  . Heart attack Mother   . Heart attack Father     Social history:  reports that he has been smoking.  He has been smoking about 1.00 pack per day. He has never used smokeless tobacco. He reports that he does not drink alcohol or use drugs.    Allergies    Allergen Reactions  . Carbamazepine Other (See Comments)    Unknown on MAR  . Tricyclic Antidepressants Other (See Comments)    Unknown on MAR    Medications:  Prior to Admission medications   Medication Sig Start Date End Date Taking? Authorizing Provider  acetaminophen (TYLENOL) 325 MG tablet Take 650 mg by mouth every morning.   Yes Historical Provider, MD  acetaminophen (TYLENOL) 650 MG CR tablet Take 650 mg by mouth every 8 (eight) hours as needed for pain.   Yes Historical Provider, MD  aspirin EC 81 MG tablet Take 81 mg by mouth daily.   Yes Historical Provider, MD  Cholecalciferol (VITAMIN D3) 2000 UNITS TABS Take 4,000 Units by mouth at bedtime.    Yes Historical Provider, MD  donepezil (ARICEPT) 5 MG tablet Take 5 mg by mouth at bedtime.  08/15/15  Yes Historical Provider, MD  DULoxetine (CYMBALTA) 30 MG capsule Take 30 mg by mouth at bedtime.    Yes Historical Provider, MD  ferrous sulfate 325 (65 FE) MG tablet Take 325 mg by mouth 2 (two) times daily.    Yes Historical Provider, MD  gabapentin (NEURONTIN) 600 MG tablet Take 600 mg by mouth 2 (two) times daily.   Yes Historical Provider, MD  hydrocortisone cream 1 % Apply 1 application topically every 6 (six) hours as needed for  itching.   Yes Historical Provider, MD  latanoprost (XALATAN) 0.005 % ophthalmic solution Place 1 drop into both eyes at bedtime.   Yes Historical Provider, MD  levETIRAcetam (KEPPRA) 1000 MG tablet Take 1 tablet (1,000 mg total) by mouth 2 (two) times daily. 05/19/14  Yes York Spanielharles K [name], MD  lisinopril (PRINIVIL,ZESTRIL) 20 MG tablet Take 20 mg by mouth daily.   Yes Historical Provider, MD  mirtazapine (REMERON) 7.5 MG tablet Take 7.5 mg by mouth daily.    Yes Historical Provider, MD  Multiple Vitamins-Iron (MULTIVITAMINS WITH IRON) TABS Take 1 tablet by mouth daily.   Yes Historical Provider, MD  phenytoin (DILANTIN) 100 MG ER capsule Take 1 capsule (100 mg total) by mouth every evening. 07/22/14  Yes  Butch PennyMegan Millikan, NP  triamcinolone cream (KENALOG) 0.1 % Apply 1 application topically 2 (two) times daily.   Yes Historical Provider, MD  vitamin C (ASCORBIC ACID) 500 MG tablet Take 500 mg by mouth 2 (two) times daily.   Yes Historical Provider, MD  zonisamide (ZONEGRAN) 50 MG capsule One tablet daily for one week, then one tablet twice a day for one week, then take one tablet in the morning and two tablets in the evening for one week, then take 2 tablets twice a day Patient taking differently: Take 100 mg by mouth 2 (two) times daily.  05/19/14  Yes York Spanielharles K [name], MD    ROS:  Out of a complete 14 system review of symptoms, the patient complains only of the following symptoms, and all other reviewed systems are negative.  Seizures  Blood pressure 134/79, pulse (!) 59, height 5\' 5"  (1.651 m), weight 120 lb (54.4 kg).  Physical Exam  General: The patient is alert and cooperative at the time of the examination.  Skin: No significant peripheral edema is noted.   Neurologic Exam  Mental status: The patient is alert and oriented x 3 at the time of the examination. The patient has apparent normal recent and remote memory, with an apparently normal attention span and concentration ability.   Cranial nerves: Facial symmetry is not present. There is some depression of the right nasolabial fold, asymmetric smile. Speech is normal, no aphasia or dysarthria is noted. Extraocular movements are full. With superior gaze, there is exotropia of the left eye. Visual fields are full.  Motor: The patient has good strength in the left extremities. On the right side, the patient has a hemiparesis with 4 minus/5 strength throughout, the patient has difficulty with abduction of the right arm, he has a prominent right footdrop.  Sensory examination: Soft touch sensation is decreased on the right face, arm, and leg.  Coordination: The patient has good finger-nose-finger and heel-to-shin on the left,  difficulty performing on the right.  Gait and station: The patient is wheelchair-bound, he was not ambulated.  Reflexes: Deep tendon reflexes are symmetric.   Assessment/Plan:  1. Right hemiparesis, left brain stroke  2. History of seizures, recent recurrence  The patient has had some alteration in his medication regimen, it is not clear when this was done. The patient no longer on Zonegran, he was placed on Keppra at some point and Aricept was also added. Aricept may lower the seizure threshold. The patient had an increase in the Dilantin dosing when he was seen in the emergency room currently taking 200 mg at night. Blood work will be done today to check the Dilantin level. He will remain on current medication regimen for now. He will follow-up in  6 months, sooner if needed.    The patient is now residing in [facility], telephone number is [phone].  Marlan Palau MD 07/20/2016 9:34 AM  [facility] [address] [place], [place] [address]  Phone [phone] Fax [phone]

## 2016-07-21 LAB — PHENYTOIN LEVEL, TOTAL

## 2016-07-23 ENCOUNTER — Telehealth: Payer: Self-pay

## 2016-07-23 NOTE — Telephone Encounter (Signed)
-----   Message from York Spanielharles K [name], MD sent at 07/22/2016  3:00 PM EST ----- Please call the [facility] and determine what dose of dilantin he is actually getting, have them increase it by 100 mg daily. My understanding was that the dilantin dose was 200 mg daily.  The phone number is in my RV note. ----- Message ----- From: Interface, [facility] Lab Results In Sent: 07/21/2016   5:40 AM To: York Spanielharles K [name], MD

## 2016-07-23 NOTE — Telephone Encounter (Signed)
Called nursing facility, was placed on hold for almost 10 min but nurse/caregiver was not available for call. Staff member agreed to take mssg and have them call back re: seizure medication.

## 2016-07-24 NOTE — Telephone Encounter (Signed)
Called SNF and was again placed on hold for several minutes, then call was disconnected.

## 2016-07-26 ENCOUNTER — Telehealth: Payer: Self-pay | Admitting: *Deleted

## 2016-07-26 MED ORDER — PHENYTOIN SODIUM EXTENDED 100 MG PO CAPS: 300.0000 mg | ORAL_CAPSULE | Freq: Every evening | ORAL | Status: DC

## 2016-07-26 NOTE — Telephone Encounter (Signed)
The patient was on 200 mg of Dilantin a day, we will go up to 300 mg day, orders for this were faxed in to the extended care facility.

## 2016-07-26 NOTE — Telephone Encounter (Signed)
Per Dr Anne HahnWillis, called [facility] and spoke with  Raynelle Fanningesha, resident care coordinator. Advised her that the patient's Dilantin level is low, and Dr Anne HahnWillis needs to know patient's current dose. She stated  he is getting 200 mg every night. Advised her that Dr Anne HahnWillis wants his Dilantin dose increased by 100 mg. She stated she will need an order faxed to her to make dose increase.

## 2016-07-26 NOTE — Telephone Encounter (Signed)
The order for Dilantin dose increase from 200 mg nightly to 300 mg nightly successfully faxed to [facility].

## 2016-10-21 ENCOUNTER — Observation Stay (HOSPITAL_COMMUNITY)
Admission: EM | Admit: 2016-10-21 | Discharge: 2016-10-22 | Disposition: A | Discharge status: Home / Self Care | Payer: Medicare Other | Attending: Internal Medicine | Admitting: Internal Medicine

## 2016-10-21 ENCOUNTER — Emergency Department (HOSPITAL_COMMUNITY): Payer: Medicare Other

## 2016-10-21 DIAGNOSIS — L97529 Non-pressure chronic ulcer of other part of left foot with unspecified severity: Secondary | ICD-10-CM | POA: Diagnosis not present

## 2016-10-21 DIAGNOSIS — G40919 Epilepsy, unspecified, intractable, without status epilepticus: Secondary | ICD-10-CM | POA: Diagnosis not present

## 2016-10-21 DIAGNOSIS — I10 Essential (primary) hypertension: Secondary | ICD-10-CM | POA: Diagnosis not present

## 2016-10-21 DIAGNOSIS — L97519 Non-pressure chronic ulcer of other part of right foot with unspecified severity: Secondary | ICD-10-CM | POA: Diagnosis not present

## 2016-10-21 DIAGNOSIS — Z993 Dependence on wheelchair: Secondary | ICD-10-CM | POA: Insufficient documentation

## 2016-10-21 DIAGNOSIS — D509 Iron deficiency anemia, unspecified: Secondary | ICD-10-CM | POA: Diagnosis not present

## 2016-10-21 DIAGNOSIS — Z7982 Long term (current) use of aspirin: Secondary | ICD-10-CM | POA: Insufficient documentation

## 2016-10-21 DIAGNOSIS — I69351 Hemiplegia and hemiparesis following cerebral infarction affecting right dominant side: Secondary | ICD-10-CM | POA: Diagnosis not present

## 2016-10-21 DIAGNOSIS — F17210 Nicotine dependence, cigarettes, uncomplicated: Secondary | ICD-10-CM | POA: Insufficient documentation

## 2016-10-21 DIAGNOSIS — Z79899 Other long term (current) drug therapy: Secondary | ICD-10-CM | POA: Diagnosis not present

## 2016-10-21 DIAGNOSIS — I69359 Hemiplegia and hemiparesis following cerebral infarction affecting unspecified side: Secondary | ICD-10-CM

## 2016-10-21 DIAGNOSIS — G40909 Epilepsy, unspecified, not intractable, without status epilepticus: Secondary | ICD-10-CM | POA: Diagnosis not present

## 2016-10-21 DIAGNOSIS — R569 Unspecified convulsions: Secondary | ICD-10-CM

## 2016-10-21 DIAGNOSIS — Z87820 Personal history of traumatic brain injury: Secondary | ICD-10-CM | POA: Diagnosis not present

## 2016-10-21 DIAGNOSIS — R42 Dizziness and giddiness: Secondary | ICD-10-CM | POA: Diagnosis not present

## 2016-10-21 DIAGNOSIS — R4182 Altered mental status, unspecified: Secondary | ICD-10-CM | POA: Diagnosis present

## 2016-10-21 LAB — COMPREHENSIVE METABOLIC PANEL
ALBUMIN: 3.9 g/dL (ref 3.5–5.0)
ALK PHOS: 168 U/L — AB (ref 38–126)
ALT: 15 U/L — AB (ref 17–63)
AST: 23 U/L (ref 15–41)
Anion gap: 8 (ref 5–15)
BUN: 23 mg/dL — AB (ref 6–20)
CALCIUM: 8.9 mg/dL (ref 8.9–10.3)
CO2: 24 mmol/L (ref 22–32)
CREATININE: 0.85 mg/dL (ref 0.61–1.24)
Chloride: 105 mmol/L (ref 101–111)
Glucose, Bld: 95 mg/dL (ref 65–99)
Potassium: 4.2 mmol/L (ref 3.5–5.1)
Sodium: 137 mmol/L (ref 135–145)
Total Bilirubin: 0.2 mg/dL — ABNORMAL LOW (ref 0.3–1.2)
Total Protein: 7.1 g/dL (ref 6.5–8.1)

## 2016-10-21 LAB — I-STAT TROPONIN, ED: Troponin i, poc: 0 ng/mL (ref 0.00–0.08)

## 2016-10-21 LAB — PROTIME-INR
INR: 1.17
Prothrombin Time: 15 seconds (ref 11.4–15.2)

## 2016-10-21 LAB — I-STAT CHEM 8, ED
BUN: 26 mg/dL — ABNORMAL HIGH (ref 6–20)
CALCIUM ION: 1.25 mmol/L (ref 1.15–1.40)
CHLORIDE: 106 mmol/L (ref 101–111)
CREATININE: 0.7 mg/dL (ref 0.61–1.24)
GLUCOSE: 90 mg/dL (ref 65–99)
HCT: 35 % — ABNORMAL LOW (ref 39.0–52.0)
Hemoglobin: 11.9 g/dL — ABNORMAL LOW (ref 13.0–17.0)
Potassium: 4 mmol/L (ref 3.5–5.1)
Sodium: 140 mmol/L (ref 135–145)
TCO2: 24 mmol/L (ref 0–100)

## 2016-10-21 LAB — CBC
HCT: 29.1 % — ABNORMAL LOW (ref 39.0–52.0)
HEMOGLOBIN: 9.7 g/dL — AB (ref 13.0–17.0)
MCH: 23.7 pg — AB (ref 26.0–34.0)
MCHC: 33.3 g/dL (ref 30.0–36.0)
MCV: 71 fL — ABNORMAL LOW (ref 78.0–100.0)
PLATELETS: 258 10*3/uL (ref 150–400)
RBC: 4.1 MIL/uL — ABNORMAL LOW (ref 4.22–5.81)
RDW: 16.2 % — AB (ref 11.5–15.5)
WBC: 8.4 10*3/uL (ref 4.0–10.5)

## 2016-10-21 LAB — DIFFERENTIAL
BASOS ABS: 0 10*3/uL (ref 0.0–0.1)
BASOS PCT: 0 %
EOS ABS: 0.3 10*3/uL (ref 0.0–0.7)
EOS PCT: 4 %
LYMPHS ABS: 2 10*3/uL (ref 0.7–4.0)
Lymphocytes Relative: 24 %
Monocytes Absolute: 1.2 10*3/uL — ABNORMAL HIGH (ref 0.1–1.0)
Monocytes Relative: 14 %
NEUTROS PCT: 58 %
Neutro Abs: 4.8 10*3/uL (ref 1.7–7.7)

## 2016-10-21 LAB — CBG MONITORING, ED: GLUCOSE-CAPILLARY: 101 mg/dL — AB (ref 65–99)

## 2016-10-21 LAB — PHENYTOIN LEVEL, TOTAL: PHENYTOIN LVL: 21.5 ug/mL — AB (ref 10.0–20.0)

## 2016-10-21 LAB — APTT: aPTT: 26 seconds (ref 24–36)

## 2016-10-21 NOTE — Consult Note (Signed)
Neurology Consultation Reason for Consult: unresponsive episode Referring Physician: Abran Duke  CC: Unresponsiveness  History is obtained from:patient, staff at nursing home  HPI: [name] is a 71 y.o. male with a history of seizures and stroke with right hemiparesis who presents with unresponsiveness. Staff state that he wheeled himself from dinner around 17:15 and then around 19:15 he was found unresponsive in his wheelchair. He does have a roommate, but that patient is not verbal.   After trying to arouse hime, he initially began opening his eyes but with conitnued lack of speech. Finally about 15 minutes after findings him he began talking and has gradually returned to baseline. Patient is amnestic to the episode. He states that he feels back to baseline other than some dizziness.   He gets dizziness which he describes as the room spinning fairly frequently, particularly with turnign in his bed, etc.    LKW: 1715 tpa given?: no, improving symptoms    ROS: A 14 point ROS was performed and is negative except as noted in the HPI.   Past Medical History:  Diagnosis Date  . CVA (cerebral infarction)   . Dilantin toxicity   . 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 History of craniotomy   . Hypertension   . Organic brain syndrome   . Pneumonia   . Polysubstance abuse   . Right hemiparesis (HCC)   . SDH (subdural hematoma) (HCC)   . Seizures (HCC)      Family History  Problem Relation Age of Onset  . Heart attack Mother   . Heart attack Father      Social History:  reports that he has been smoking.  He has been smoking about 1.00 pack per day. He has never used smokeless tobacco. He reports that he does not drink alcohol or use drugs.   Exam: Current vital signs: BP 127/80   Pulse 76   Temp 97.9 F (36.6 C)   Resp 11   Ht 5\' 5"  (1.651 m)   Wt 49.4 kg (108 lb 14.5 oz)   SpO2 100%   BMI 18.12 kg/m  Vital signs in last 24 hours: Temp:  [97.9 F (36.6 C)-98.5 F  (36.9 C)] 97.9 F (36.6 C) (03/18 2119) Pulse Rate:  [71-84] 76 (03/18 2130) Resp:  [11-17] 11 (03/18 2130) BP: (119-142)/(75-83) 127/80 (03/18 2130) SpO2:  [99 %-100 %] 100 % (03/18 2130) Weight:  [49.4 kg (108 lb 14.5 oz)] 49.4 kg (108 lb 14.5 oz) (03/18 2030)   Physical Exam  Constitutional: Appears well-developed and well-nourished.  Psych: Affect appropriate to situation Eyes: No scleral injection HENT: No OP obstrucion Head: Normocephalic.  Cardiovascular: Normal rate and regular rhythm.  Respiratory: Effort normal and breath sounds normal to anterior ascultation GI: Soft.  No distension. There is no tenderness.  Skin: WDI  Neuro: Mental Status: Patient is awake, alert, interactive and appropriate Patient is able to give a clear and coherent history. No signs of aphasia or neglect Cranial Nerves: II: Visual Fields are full. Pupils are equal, round, and reactive to light.   III,IV, VI: EOMI without ptosis or diploplia. He has some rotary nystagmus on left ward gaze that fatigues and is not present on subsequent exam.  V: Facial sensation is decreased on the right.  VII: Facial movement with right sided weakness VIII: hearing is intact to voice X: Uvula elevates symmetrically XI: Shoulder shrug is symmetric. XII: tongue is midline without atrophy or fasciculations.  Motor: Tone is normal. Bulk is normal.  5/5 strength was present on the left side. He has 3-4/5 strength throughout Sensory: Sensation is diminished throughout the right side.  Cerebellar: FNF and HKS are consistent with weakness on right. ? Slower than expected on left but no definite ataxia.   I have reviewed labs in epic and the results pertinent to this consultation are: cmp elevated alk phos, otherwise unremarkable.   I have reviewed the images obtained:CT head - no acute findings.   Impression: 71 yo M with unresponsive episode and gradual return to baseline very much most consistent with seizure. I  suspect that this is what happened. With his dizziness, however, it would be reasonable to do an MRI to rule out stroke.   Recommendations: 1) MRI brain 2) If negative, would increase home zonisamide to '150mg'$  BID 3) continue other AEDs at home dose.    Roland Rack, MD [facility] [phone]  If 7pm- 7am, please page neurology on call as listed in [place].

## 2016-10-21 NOTE — ED Notes (Signed)
Pt to MRI via stretcher.

## 2016-10-21 NOTE — Progress Notes (Signed)
Peter RoysDonnie [name] is a 71 year old male [facility]. He has a history of seizures and stroke with Right side hemiparesis. Pt wheeled himself to the dinning room at 1715 and staff found him slumped over in his wheel chair at 1915 unresponsive. CBG 101, NIHSS 4, LKW 1715. To be admitted to neuro for further evaluation, symptoms improving no TPA given.

## 2016-10-21 NOTE — ED Provider Notes (Addendum)
[facility]-EMERGENCY DEPT Provider Note   CSN: [id number] Arrival date & time: 10/21/16  2003     History   Chief Complaint Chief Complaint  Patient presents with  . Code Stroke    HPI [name] is a 71 y.o. male.  Patient presents from nursing home after episode of unresponsiveness. Patient slumped to the right approximate 7:15 without witnessed seizure activity. Patient has a history of subdural, seizures, stroke with right-sided deficits. Patient feels he is close to normal at this time. Patient has had mild generalized headache and dizzy/lightheadedness. Patient is on Keppra and Dilantin.      Past Medical History:  Diagnosis Date  . CVA (cerebral infarction)   . Dilantin toxicity   . 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 History of craniotomy   . Hypertension   . Organic brain syndrome   . Pneumonia   . Polysubstance abuse   . Right hemiparesis (HCC)   . SDH (subdural hematoma) (HCC)   . Seizures [facility](HCC)     Patient Active Problem List   Diagnosis Date Noted  . Colon cancer screening 12/15/2014  . 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 Seizure disorder (HCC) 05/20/2013  . Hemiparesis affecting dominant side as late effect of stroke (HCC) 05/20/2013  . Tobacco use disorder 05/20/2013    Past Surgical History:  Procedure Laterality Date  . COLONOSCOPY WITH PROPOFOL N/A 12/15/2014   Procedure: COLONOSCOPY WITH PROPOFOL;  Surgeon: Charlott RakesVincent Schooler, MD;  [facility];  Service: Endoscopy;  Laterality: N/A;  . CRANIOTOMY     following head trauma       Home Medications    Prior to Admission medications   Medication Sig Start Date End Date Taking? Authorizing Provider  acetaminophen (TYLENOL) 325 MG tablet Take 650 mg by mouth every morning. AND MAY TAKE AN ADDITIONAL 650 MG EVERY 8 HOURS AS NEEDED FOR PAIN   Yes Historical Provider, MD  aspirin EC 81 MG tablet Take 81 mg by mouth daily.   Yes Historical Provider, MD  Cholecalciferol (VITAMIN D3) 2000 UNITS TABS  Take 4,000 Units by mouth at bedtime.    Yes Historical Provider, MD  donepezil (ARICEPT) 5 MG tablet Take 5 mg by mouth at bedtime.  08/15/15  Yes Historical Provider, MD  DULoxetine (CYMBALTA) 30 MG capsule Take 30 mg by mouth at bedtime.    Yes Historical Provider, MD  ferrous sulfate 325 (65 FE) MG tablet Take 325 mg by mouth 2 (two) times daily.    Yes Historical Provider, MD  gabapentin (NEURONTIN) 600 MG tablet Take 600 mg by mouth 2 (two) times daily.   Yes Historical Provider, MD  hydrocortisone cream 1 % Apply 1 application topically every 6 (six) hours as needed for itching.   Yes Historical Provider, MD  latanoprost (XALATAN) 0.005 % ophthalmic solution Place 1 drop into both eyes at bedtime.   Yes Historical Provider, MD  levETIRAcetam (KEPPRA) 750 MG tablet Take 750 mg by mouth 2 (two) times daily.   Yes Historical Provider, MD  lisinopril (PRINIVIL,ZESTRIL) 20 MG tablet Take 20 mg by mouth daily.   Yes Historical Provider, MD  mirtazapine (REMERON) 7.5 MG tablet Take 7.5 mg by mouth daily.    Yes Historical Provider, MD  Multiple Vitamin (TAB-A-VITE) TABS Take 1 tablet by mouth every morning.   Yes Historical Provider, MD  phenytoin (DILANTIN) 100 MG ER capsule Take 3 capsules (300 mg total) by mouth every evening. Patient taking differently: Take 300 mg by mouth at bedtime.  07/26/16  Yes York Spaniel, MD  triamcinolone cream (KENALOG) 0.1 % Apply 1 application topically 2 (two) times daily. TO AFFECTED AREAS   Yes Historical Provider, MD  UNABLE TO FIND "Mighty Shake": Drink 1 shake by mouth three times a day with meals   Yes Historical Provider, MD  vitamin C (ASCORBIC ACID) 500 MG tablet Take 500 mg by mouth 2 (two) times daily.   Yes Historical Provider, MD  levETIRAcetam (KEPPRA) 1000 MG tablet Take 1 tablet (1,000 mg total) by mouth 2 (two) times daily. Patient not taking: Reported on 10/21/2016 05/19/14   York Spaniel, MD  zonisamide (ZONEGRAN) 50 MG capsule Take three  tablets twice daily. 10/22/16   Blane Ohara, MD    Family History Family History  Problem Relation Age of Onset  . Heart attack Mother   . Heart attack Father     Social History Social History  Substance Use Topics  . Smoking status: Current Every Day Smoker    Packs/day: 1.00  . Smokeless tobacco: Never Used  . Alcohol use No     Allergies   Carbamazepine and Tricyclic antidepressants   Review of Systems Review of Systems  Unable to perform ROS: Acuity of condition     Physical Exam Updated Vital Signs BP 101/69   Pulse 73   Temp 97.9 F (36.6 C)   Resp 10   Ht 5\' 5"  (1.651 m)   Wt 108 lb 14.5 oz (49.4 kg)   SpO2 99%   BMI 18.12 kg/m   Physical Exam  Constitutional: No distress.  HENT:  Head: Atraumatic.  Eyes: Pupils are equal, round, and reactive to light. Right eye exhibits no discharge. Left eye exhibits no discharge.  Neck: Normal range of motion. Neck supple. No tracheal deviation present.  Cardiovascular: Normal rate and regular rhythm.   Pulmonary/Chest: Effort normal and breath sounds normal.  Abdominal: Soft. He exhibits no distension. There is no tenderness. There is no guarding.  Musculoskeletal: He exhibits no edema.  Neurological: He is alert.  On arrival patient alert and oriented, answers questions however mild slowness. Patient has chronic right-sided weakness which is similar to previous per patient. Patient does have rotary nystagmus and has dizziness with this.   Skin: Skin is warm. Rash (blisters feet ) noted.  Psychiatric: Thought content normal. His speech is not rapid and/or pressured. He is slowed.  Nursing note and vitals reviewed.    ED Treatments / Results  Labs (all labs ordered are listed, but only abnormal results are displayed) Labs Reviewed  CBC - Abnormal; Notable for the following:       Result Value   RBC 4.10 (*)    Hemoglobin 9.7 (*)    HCT 29.1 (*)    MCV 71.0 (*)    MCH 23.7 (*)    RDW 16.2 (*)    All  other components within normal limits  DIFFERENTIAL - Abnormal; Notable for the following:    Monocytes Absolute 1.2 (*)    All other components within normal limits  COMPREHENSIVE METABOLIC PANEL - Abnormal; Notable for the following:    BUN 23 (*)    ALT 15 (*)    Alkaline Phosphatase 168 (*)    Total Bilirubin 0.2 (*)    All other components within normal limits  PHENYTOIN LEVEL, TOTAL - Abnormal; Notable for the following:    Phenytoin Lvl 21.5 (*)    All other components within normal limits  CBG MONITORING, ED - Abnormal;  Notable for the following:    Glucose-Capillary 101 (*)    All other components within normal limits  I-STAT CHEM 8, ED - Abnormal; Notable for the following:    BUN 26 (*)    Hemoglobin 11.9 (*)    HCT 35.0 (*)    All other components within normal limits  PROTIME-INR  APTT  I-STAT TROPOININ, ED  CBG MONITORING, ED    EKG  EKG Interpretation None       Radiology Mr Brain Wo Contrast  Result Date: 10/21/2016 CLINICAL DATA:  Dizziness.  Right-sided hemi paresis. EXAM: MRI HEAD WITHOUT CONTRAST TECHNIQUE: Multiplanar, multiecho pulse sequences of the brain and surrounding structures were obtained without intravenous contrast. COMPARISON:  Head CT 10/21/2016 FINDINGS: Brain: No focal diffusion restriction to indicate acute infarct. No intraparenchymal hemorrhage. There is mild multifocal hyperintense T2-weighted signal within the periventricular white matter, most often seen in the setting of chronic microvascular ischemia. No mass lesion or midline shift. There is a septated area of extra-axial fluid within the left posterior fossa that has slowly increased size over multiple prior studies. The midline structures are normal. No age advanced or lobar predominant atrophy. Vascular: Major intracranial arterial and venous sinus flow voids are preserved. No evidence of chronic microhemorrhage or amyloid angiopathy. Skull and upper cervical spine: Remote right  frontoparietal craniectomy with skull plate. Sinuses/Orbits: No fluid levels or advanced mucosal thickening. No mastoid effusion. Normal orbits. IMPRESSION: 1. Mild findings of chronic microvascular disease without acute intracranial abnormality. 2. Remote right frontoparietal craniectomy with metallic closure plate. Electronically Signed   By: Deatra Robinson [name]   On: 10/21/2016 23:30   Ct Head Code Stroke W/o Cm  Result Date: 10/21/2016 CLINICAL DATA:  Code stroke. Found slumped over. Suspected seizure. EXAM: CT HEAD WITHOUT CONTRAST TECHNIQUE: Contiguous axial images were obtained from the base of the skull through the vertex without intravenous contrast. COMPARISON:  12/19/2014 FINDINGS: Brain: Artifact related to right-sided craniectomy mildly limits assessment of the underlying frontoparietal cortex. Within this limitation, there is no evidence of acute cortical infarct, intracranial hemorrhage, mass, or midline shift. There is mild cerebral atrophy. Cerebellar atrophy and prominent surrounding CSF in the left greater than right posterior fossa are unchanged. Vascular: Calcified atherosclerosis at the skullbase. No hyperdense vessel. Skull: Right-sided craniectomy. Sinuses/Orbits: Visualized paranasal sinuses and mastoid air cells are clear. Unremarkable orbits. Other: None. ASPECTS [facility] Stroke Program Early CT Score) - Ganglionic level infarction (caudate, lentiform nuclei, internal capsule, insula, M1-M3 cortex): 7 - Supraganglionic infarction (M4-M6 cortex): 3 Total score (0-10 with 10 being normal): 10 IMPRESSION: 1. No evidence of acute intracranial abnormality. 2. ASPECTS is 10. These results were called by telephone at the time of interpretation on 10/21/2016 at 8:23 pm to Dr. Amada Jupiter, who verbally acknowledged these results. Electronically Signed   By: Sebastian Ache [name]   On: 10/21/2016 20:41    Procedures Procedures (including critical care time)  Medications Ordered in  ED Medications - No data to display   Initial Impression / Assessment and Plan / ED Course  I have reviewed the triage vital signs and the nursing notes.  Pertinent labs & imaging results that were available during my care of the patient were reviewed by me and considered in my medical decision making (see chart for details).    Patient presents after unresponsive episode as a code stroke. Neurology assisted with assessment at bedside. Neurology specialist feels this likely was a seizure. Patient had baseline from his stroke history. Patient  does have nystagmus and dizziness so plan for MRI to look for posterior stroke. Plan to adjust seizure medicines and likely close outpatient follow-up if MRI unremarkable.  Neurology recommended outpatient follow-up if no further seizure activity and MRI unremarkable. Plan increase seizure medicine and follow-up with primary doctor.  On reassessment patient feels at baseline. Patient has chronic right arm and leg weakness. Patient has normal strength in the left. Discussed importance of follow-up for his foot ulcers that have been there he feels for 3 or 4 days. No seizure activity in the ER.  Prior to discharge pt had another seizure.  Paged and discussed with neuro.  Paged hospitalist for admission.    Medications - No data to display  Vitals:   10/21/16 2119 10/21/16 2130 10/21/16 2145 10/21/16 2345  BP:  127/80 118/74 101/69  Pulse:  76 74 73  Resp:  11 10 10   Temp: 97.9 F (36.6 C)     TempSrc:      SpO2:  100% 100% 99%  Weight:      Height:        Final diagnoses:  Altered mental status, unspecified altered mental status type  Skin ulcers of foot, bilateral (HCC)  Seizure (HCC)      Final Clinical Impressions(s) / ED Diagnoses   Final diagnoses:  Altered mental status, unspecified altered mental status type  Skin ulcers of foot, bilateral (HCC)  Seizure [facility])    New Prescriptions Current Discharge Medication List        Blane Ohara, MD 10/21/16 2321    Blane Ohara, MD 10/22/16 0000    Blane Ohara, MD 10/29/16 [phone]

## 2016-10-21 NOTE — Discharge Instructions (Addendum)
Increase zonisamide to 150 mg twice daily until you see your physician later this week. Have foot ulcers reassessed this week.  If you were given medicines take as directed.  If you are on coumadin or contraceptives realize their levels and effectiveness is altered by many different medicines.  If you have any reaction (rash, tongues swelling, other) to the medicines stop taking and see a physician.    If your blood pressure was elevated in the ER make sure you follow up for management with a primary doctor or return for chest pain, shortness of breath or stroke symptoms.  Please follow up as directed and return to the ER or see a physician for new or worsening symptoms.  Thank you. Vitals:   10/21/16 2115 10/21/16 2119 10/21/16 2130 10/21/16 2145  BP: 119/83  127/80 118/74  Pulse: 84  76 74  Resp: 17  11 10   Temp:  97.9 F (36.6 C)    TempSrc:      SpO2: 100%  100% 100%  Weight:      Height:

## 2016-10-21 NOTE — ED Notes (Signed)
Per lab blue top tube has clotted, needs to be redrawn

## 2016-10-21 NOTE — ED Triage Notes (Addendum)
Brought by ems from [facility].  Reports patient had dinner.  Found at 1915 slumped to the right in wheelchair.  Not A&O.  c/o HA and dizzy.  Last seen normal 1715.

## 2016-10-22 ENCOUNTER — Encounter (HOSPITAL_COMMUNITY): Payer: Self-pay | Admitting: Family Medicine

## 2016-10-22 DIAGNOSIS — D509 Iron deficiency anemia, unspecified: Secondary | ICD-10-CM | POA: Diagnosis not present

## 2016-10-22 DIAGNOSIS — I10 Essential (primary) hypertension: Secondary | ICD-10-CM | POA: Diagnosis present

## 2016-10-22 DIAGNOSIS — I69359 Hemiplegia and hemiparesis following cerebral infarction affecting unspecified side: Secondary | ICD-10-CM

## 2016-10-22 DIAGNOSIS — G40909 Epilepsy, unspecified, not intractable, without status epilepticus: Secondary | ICD-10-CM | POA: Diagnosis not present

## 2016-10-22 DIAGNOSIS — R569 Unspecified convulsions: Secondary | ICD-10-CM | POA: Diagnosis not present

## 2016-10-22 LAB — FERRITIN: Ferritin: 1375 ng/mL — ABNORMAL HIGH (ref 24–336)

## 2016-10-22 LAB — TROPONIN I: Troponin I: <0.03 ng/mL (ref <0.03)

## 2016-10-22 MED ORDER — GABAPENTIN 600 MG PO TABS
600.0000 mg | ORAL_TABLET | Freq: Two times a day (BID) | ORAL | Status: DC
  Administered 2016-10-22: 600 mg via ORAL
  Filled 2016-10-22 (×2): qty 1

## 2016-10-22 MED ORDER — LORAZEPAM 2 MG/ML IJ SOLN
1.0000 mg | Freq: Once | INTRAMUSCULAR | Status: AC
  Administered 2016-10-22: 1 mg via INTRAVENOUS
  Filled 2016-10-22: qty 1

## 2016-10-22 MED ORDER — ZONISAMIDE 100 MG PO CAPS
150.0000 mg | ORAL_CAPSULE | Freq: Every day | ORAL | Status: DC
  Administered 2016-10-22: 150 mg via ORAL
  Filled 2016-10-22: qty 2

## 2016-10-22 MED ORDER — ACETAMINOPHEN 650 MG RE SUPP: 650.0000 mg | Freq: Four times a day (QID) | RECTAL | Status: DC | PRN

## 2016-10-22 MED ORDER — LISINOPRIL 20 MG PO TABS
20.0000 mg | ORAL_TABLET | Freq: Every day | ORAL | Status: DC
Start: 2016-10-22 — End: 2016-10-22
  Administered 2016-10-22: 20 mg via ORAL
  Filled 2016-10-22: qty 1

## 2016-10-22 MED ORDER — FERROUS SULFATE 325 (65 FE) MG PO TABS: 325.0000 mg | ORAL_TABLET | Freq: Two times a day (BID) | ORAL | Status: DC

## 2016-10-22 MED ORDER — LEVETIRACETAM 100 MG/ML PO SOLN
750.0000 mg | Freq: Two times a day (BID) | ORAL | Status: DC
  Administered 2016-10-22: 750 mg via ORAL
  Filled 2016-10-22 (×2): qty 7.5

## 2016-10-22 MED ORDER — FERROUS SULFATE 300 (60 FE) MG/5ML PO SYRP
300.0000 mg | ORAL_SOLUTION | Freq: Two times a day (BID) | ORAL | Status: DC
  Administered 2016-10-22 (×2): 300 mg via ORAL
  Filled 2016-10-22 (×2): qty 5

## 2016-10-22 MED ORDER — LEVETIRACETAM 750 MG PO TABS
750.0000 mg | ORAL_TABLET | Freq: Two times a day (BID) | ORAL | Status: DC
  Filled 2016-10-22: qty 1

## 2016-10-22 MED ORDER — DULOXETINE HCL 30 MG PO CPEP
30.0000 mg | ORAL_CAPSULE | Freq: Every day | ORAL | Status: DC
  Filled 2016-10-22: qty 1

## 2016-10-22 MED ORDER — ZONISAMIDE 50 MG PO CAPS: 150.0000 mg | ORAL_CAPSULE | Freq: Every day | ORAL | 0 refills | Status: AC

## 2016-10-22 MED ORDER — PHENYTOIN SODIUM EXTENDED 100 MG PO CAPS
300.0000 mg | ORAL_CAPSULE | Freq: Every day | ORAL | Status: DC
  Filled 2016-10-22: qty 3

## 2016-10-22 MED ORDER — IBUPROFEN 200 MG PO TABS: 400.0000 mg | ORAL_TABLET | Freq: Four times a day (QID) | ORAL | Status: DC | PRN

## 2016-10-22 MED ORDER — ACETAMINOPHEN 160 MG/5ML PO SOLN: 650.0000 mg | Freq: Four times a day (QID) | ORAL | Status: DC | PRN

## 2016-10-22 MED ORDER — ACETAMINOPHEN 325 MG PO TABS: 650.0000 mg | ORAL_TABLET | Freq: Four times a day (QID) | ORAL | Status: DC | PRN

## 2016-10-22 MED ORDER — SODIUM CHLORIDE 0.9% FLUSH
3.0000 mL | Freq: Two times a day (BID) | INTRAVENOUS | Status: DC
  Administered 2016-10-22 (×2): 3 mL via INTRAVENOUS

## 2016-10-22 MED ORDER — ORAL CARE MOUTH RINSE
15.0000 mL | Freq: Two times a day (BID) | OROMUCOSAL | Status: DC
  Administered 2016-10-22: 15 mL via OROMUCOSAL

## 2016-10-22 MED ORDER — ASPIRIN 81 MG PO CHEW
81.0000 mg | CHEWABLE_TABLET | Freq: Every day | ORAL | Status: DC
  Administered 2016-10-22: 81 mg via ORAL
  Filled 2016-10-22: qty 1

## 2016-10-22 MED ORDER — ZONISAMIDE 50 MG PO CAPS: ORAL_CAPSULE | ORAL | 1 refills | Status: DC

## 2016-10-22 MED ORDER — LORAZEPAM 2 MG/ML IJ SOLN: 1.0000 mg | INTRAMUSCULAR | Status: DC | PRN

## 2016-10-22 MED ORDER — PHENYTOIN 125 MG/5ML PO SUSP
100.0000 mg | Freq: Three times a day (TID) | ORAL | Status: DC
  Administered 2016-10-22 (×2): 100 mg via ORAL
  Filled 2016-10-22 (×3): qty 4

## 2016-10-22 MED ORDER — ASPIRIN EC 81 MG PO TBEC: 81.0000 mg | DELAYED_RELEASE_TABLET | Freq: Every day | ORAL | Status: DC

## 2016-10-22 MED ORDER — ENOXAPARIN SODIUM 40 MG/0.4ML ~~LOC~~ SOLN
40.0000 mg | Freq: Every day | SUBCUTANEOUS | Status: DC
  Administered 2016-10-22: 40 mg via SUBCUTANEOUS
  Filled 2016-10-22: qty 0.4

## 2016-10-22 NOTE — Progress Notes (Signed)
Report called to [facility] at [facility].  Awaiting [facility] for transport.  Will continue to monitor.  Sondra ComeSilva, Kyarra Vancamp M, RN

## 2016-10-22 NOTE — Care Management Note (Signed)
Case Management Note  Patient Details  Name: [name] MRN: [id number] Date of Birth: Jan 13, 1946  Subjective/Objective:         Patient presented to the ED with an acute mental status change. Resides at [facility]. CM will follow for discharge needs pending patient's progress and physician orders.            Action/Plan:   Expected Discharge Date:                  Expected Discharge Plan:     In-House Referral:     Discharge planning Services     Post Acute Care Choice:    Choice offered to:     DME Arranged:    DME Agency:     HH Arranged:    HH Agency:     Status of Service:     If discussed at [employer] Length of Stay Meetings, dates discussed:    Additional Comments:  Anda KraftRobarge, Peter Lech C, RN 10/22/2016, 10:12 AM

## 2016-10-22 NOTE — Clinical Social Work Note (Addendum)
Clinical Social Work Assessment  Patient Details  Name: [name] MRN: [id number] Date of Birth: January 06, 1946  Date of referral:  10/22/16               Reason for consult:  Facility Placement                Permission sought to share information with:  Family Supports Permission granted to share information::  No (Patient oriented to person and place only)  Name::     Clide CliffWanda Poole   Agency::     Relationship::  Sister  Contact Information:  [phone]  Housing/Transportation Living arrangements for the past 2 months:  Assisted Living Facility [facility] Source of Information:  Other (Comment Required) (Sister, facility staff person and patient's chart) Patient Interpreter Needed:  None Criminal Activity/Legal Involvement Pertinent to Current Situation/Hospitalization:  No - Comment as needed Significant Relationships:  Siblings [name]([name]) Lives with:  [address]ility Resident [address] [facility] Do you feel safe going back to the place where you live?  Yes Need for family participation in patient care:  Yes (Comment)  Care giving concerns:  No concerns expressed by patient's sister regarding care at ALF.  Social Worker assessment / plan:  Sister, Burna MortimerWanda contacted by phone and advised of patient's discharge and ambulance transport. Ms. Dutch Quintoole indicated that she contacted facility today and asked about her brother and was told he was fine, but they did not tell her that patient's was in hospital. CSW visited room to talk with patient, however he was asleep and did not awaken when his name was called (3 times).   Employment status:  Disabled (Comment on whether or not currently receiving Disability) Insurance information:  Medicare, [place] Recommendations:  Not assessed at this time Information / Referral to community resources:  Skilled Nursing Facility (Information not requested or needed as patient from ALF)  Patient/Family's Response to care: Sister expressed no  concerns regarding patient's care during hospitalization.  Patient/Family's Understanding of and Emotional Response to Diagnosis, Current Treatment, and Prognosis:  Not discussed.  Emotional Assessment Appearance:  Appears stated age Attitude/Demeanor/Rapport:  Unable to Assess Affect (typically observed):  Unable to Assess Orientation:  Oriented to Self, Oriented to Place Alcohol / Substance use:  Tobacco Use, Alcohol Use, Illicit Drugs (Patient reports that he smokes and does not drink or use illicit drugs) Psych involvement (Current and /or in the community):  No (Comment)  Discharge Needs  Concerns to be addressed:  Discharge Planning Concerns Readmission within the last 30 days:  No Current discharge risk:  None Barriers to Discharge:  No Barriers Identified   Cristobal GoldmannCrawford, Ronesha Heenan Bradley, LCSW 10/22/2016, 4:38 PM

## 2016-10-22 NOTE — Progress Notes (Signed)
Transported via [facility].  Sondra ComeSilva, Romonda Parker M, RN

## 2016-10-22 NOTE — NC FL2 (Signed)
[place] MEDICAID FL2 LEVEL OF CARE SCREENING TOOL     IDENTIFICATION  Patient Name: [name] Birthdate: [birth year] Sex: male Admission Date (Current Location): 10/21/2016  [place] and [place] Number:  [name] [id number] Facility and Address:  [facility], [address] [place], [place] [address]      Provider Number: [id number]  Attending Physician Name and Address:  Joseph Art, DO  Relative Name and Phone Number:  [name],[name] Sister; [name], [name]; [phone]     Current Level of Care: Hospital Recommended Level of Care: Assisted Living Facility [name] Prior Approval Number:    Date Approved/Denied:   PASRR Number:    Discharge Plan: Other (Comment) ([facility])    Current Diagnoses: Patient Active Problem List   Diagnosis Date Noted  . Seizure (HCC) 10/22/2016  . Essential hypertension 10/22/2016  . Microcytic anemia 10/22/2016  . Colon cancer screening 12/15/2014  . 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 Seizure disorder (HCC) 05/20/2013  . Hemiparesis affecting dominant side as late effect of stroke (HCC) 05/20/2013  . Tobacco use disorder 05/20/2013    Orientation RESPIRATION BLADDER Height & Weight     Self, Place  Normal Incontinent Weight: 108 lb 14.5 oz (49.4 kg) Height:  5\' 5"  (165.1 cm)  BEHAVIORAL SYMPTOMS/MOOD NEUROLOGICAL BOWEL NUTRITION STATUS    Convulsions/Seizures (History of seizures) Continent Diet (DYS 2)  AMBULATORY STATUS COMMUNICATION OF NEEDS Skin   Total Care (Uses wheelchair) Verbally Normal                       Personal Care Assistance Level of Assistance  Bathing, Feeding, Dressing Bathing Assistance: Maximum assistance Feeding assistance: Limited assistance Dressing Assistance: Maximum assistance     Functional Limitations Info  Sight, Hearing, Speech Sight Info: Adequate Hearing Info: Adequate Speech Info: Adequate    SPECIAL CARE FACTORS FREQUENCY   PT (By licensed PT), OT (By licensed OT)                    Contractures      Additional Factors Info#                  Current Medications (10/22/2016):  This is the current hospital active medication list Current Facility-Administered Medications  Medication Dose Route Frequency Provider Last Rate Last Dose  . acetaminophen (TYLENOL) solution 650 mg  650 mg Oral Q6H PRN Alberteen Sam, MD      . aspirin chewable tablet 81 mg  81 mg Oral Daily Alberteen Sam, MD   81 mg at 10/22/16 1008  . DULoxetine (CYMBALTA) DR capsule 30 mg  30 mg Oral QHS Alberteen Sam, MD      . enoxaparin (LOVENOX) injection 40 mg  40 mg Subcutaneous Daily Alberteen Sam, MD   40 mg at 10/22/16 1008  . ferrous sulfate 300 (60 Fe) MG/5ML syrup 300 mg  300 mg Oral BID WC Alberteen Sam, MD   300 mg at 10/22/16 1008  . gabapentin (NEURONTIN) tablet 600 mg  600 mg Oral BID Alberteen Sam, MD   600 mg at 10/22/16 1008  . ibuprofen (ADVIL,MOTRIN) tablet 400 mg  400 mg Oral Q6H PRN Alberteen Sam, MD      . levETIRAcetam (KEPPRA) 100 MG/ML solution 750 mg  750 mg Oral BID Alberteen Sam, MD   750 mg at 10/22/16 1007  . lisinopril (PRINIVIL,ZESTRIL) tablet 20 mg  20 mg Oral Daily Alberteen Samhristopher P Danford, MD   20 mg at 10/22/16 1023  . LORazepam (ATIVAN) injection 1 mg  1 mg Intravenous Q5 min PRN Alberteen Samhristopher P Danford, MD      . phenytoin (DILANTIN) 125 MG/5ML suspension 100 mg  100 mg Oral TID Alberteen Samhristopher P Danford, MD   100 mg at 10/22/16 1008  . sodium chloride flush (NS) 0.9 % injection 3 mL  3 mL Intravenous Q12H Alberteen Samhristopher P Danford, MD   3 mL at 10/22/16 1010  . zonisamide (ZONEGRAN) capsule 150 mg  150 mg Oral Daily Alberteen Samhristopher P Danford, MD   150 mg at 10/22/16 1007     Discharge Medications: Please see discharge summary for a list of discharge medications.  Relevant Imaging Results:  Relevant Lab Results:   Additional Information -  DISCHARGE MEDICATION    TAKE these medications   acetaminophen 325 MG tablet Commonly known as:  TYLENOL Take 650 mg by mouth every morning. AND MAY TAKE AN ADDITIONAL 650 MG EVERY 8 HOURS AS NEEDED FOR PAIN   aspirin EC 81 MG tablet Take 81 mg by mouth daily.   donepezil 5 MG tablet Commonly known as:  ARICEPT Take 5 mg by mouth at bedtime.   DULoxetine 30 MG capsule Commonly known as:  CYMBALTA Take 30 mg by mouth at bedtime.   ferrous sulfate 325 (65 FE) MG tablet Take 325 mg by mouth 2 (two) times daily.   gabapentin 600 MG tablet Commonly known as:  NEURONTIN Take 600 mg by mouth 2 (two) times daily.   hydrocortisone cream 1 % Apply 1 application topically every 6 (six) hours as needed for itching.   latanoprost 0.005 % ophthalmic solution Commonly known as:  XALATAN Place 1 drop into both eyes at bedtime.   levETIRAcetam 750 MG tablet Commonly known as:  KEPPRA Take 750 mg by mouth 2 (two) times daily.   lisinopril 20 MG tablet Commonly known as:  PRINIVIL,ZESTRIL Take 20 mg by mouth daily.   mirtazapine 7.5 MG tablet Commonly known as:  REMERON Take 7.5 mg by mouth daily.   phenytoin 100 MG ER capsule Commonly known as:  DILANTIN Take 3 capsules (300 mg total) by mouth every evening. What changed:  when to take this   TAB-A-VITE Tabs Take 1 tablet by mouth every morning.   triamcinolone cream 0.1 % Commonly known as:  KENALOG Apply 1 application topically 2 (two) times daily. TO AFFECTED AREAS   UNABLE TO FIND "Mighty Shake": Drink 1 shake by mouth three times a day with meals   vitamin C 500 MG tablet Commonly known as:  ASCORBIC ACID Take 500 mg by mouth 2 (two) times daily.   Vitamin D3 2000 units Tabs Take 4,000 Units by mouth at bedtime.   zonisamide 50 MG capsule Commonly known as:  ZONEGRAN Take 3 capsules (150 mg total) by mouth daily. Start taking on:  10/23/2016 What changed:  how much to take  how to take  this  when to take this  additional instructions     Okey DupreCrawford, Lazaro ArmsVanessa Bradley, LCSW

## 2016-10-22 NOTE — Care Management Note (Signed)
Case Management Note  Patient Details  Name: [name] MRN: [id number] Date of Birth: May 28, 1946  Subjective/Objective:                    Action/Plan: Pt [facility]. CSW aware. No further needs per CM.  Expected Discharge Date:  10/22/16               Expected Discharge Plan:  Assisted Living / Rest Home  In-House Referral:  Clinical Social Work  Discharge planning Services     Post Acute Care Choice:    Choice offered to:     DME Arranged:    DME Agency:     HH Arranged:    HH Agency:     Status of Service:  Completed, signed off  If discussed at [employer] Meetings, dates discussed:    Additional Comments:  Kermit BaloKelli F Saul Dorsi, RN 10/22/2016, 1:23 PM

## 2016-10-22 NOTE — ED Provider Notes (Signed)
  Physical Exam  BP 101/69   Pulse 73   Temp 97.9 F (36.6 C)   Resp 10   Ht 5\' 5"  (1.651 m)   Wt 108 lb 14.5 oz (49.4 kg)   SpO2 99%   BMI 18.12 kg/m   Physical Exam  ED Course  Procedures  MDM  71 yo comes in w/ confusion, weakness. Pt comes in as code stroke. Clinical concerns initially was that pt had seizures - but MRI was done to r/o stroke. Pt just had an episode of seizure at the time of discharge. Pt's mental status had improved overtime here. Neuro consulted again - and they recommend admission.  Pt has seizure hx - he is on dilantin, keppra and zonegran. Intervention here: Ativan and increased dose of zonegran.  It doesn't appear that there is underlying infection.       Derwood KaplanAnkit Maijor Hornig, MD 10/22/16 [id number]

## 2016-10-22 NOTE — Progress Notes (Signed)
Subjective: Patient is alert and oriented. No further seizures overnight. Tolerating the increase in Zosyn mild without any difficulty. Currently obtaining a speech swallow exam.  Exam: Vitals:   10/22/16 0115 10/22/16 0145  BP: 117/80 117/87  Pulse: 77 74  Resp: 19 11  Temp:        Neuro:  CN: Pupils are equal and round. They are symmetrically reactive from 3-->2 mm. EOMI without nystagmus. Facial sensation is Decreased on the right to light touch. Face is asymmetric on the right. Hearing is intact to conversational voice. Palate elevates symmetrically and uvula is midline. Voice is normal in tone, pitch and quality. Bilateral SCM and trapezii are 5/5. Tongue is midline with normal bulk and mobility.  Motor: . Tone is normal. Bulk is normal. 5/5 strength was present on the left side. He has 3-4/5 strength throughout Sensation: Decreased on the right      Pertinent Labs/Diagnostics: MRI shows no acute intracranial abnormalities  Felicie MornDavid Jolanta Cabeza PA-C [facility] [phone]  Impression: 71 year old male with unresponsive episodes with gradual return to baseline. Most likely breakthrough seizure. MRI brain is negative for any intracranial abnormalities. Patient Zosyn mild was increased to 150 twice a day. Patient is doing well at this time. At this time would continue antiepileptic medications at current doses. No further recommendations per neuro. Thank you for this consult neurology will sign off at this time- discussed with Dr. Zenaida NieceVan     10/22/2016, 10:15 AM

## 2016-10-22 NOTE — Progress Notes (Signed)
Arrived to 5M06. Drowsy and respond to voice. Oriented to person, place,time, not situation. Denies any pain. Seizure precaution in placed. Bed in lowest position. Call light within reach.

## 2016-10-22 NOTE — ED Notes (Addendum)
Called to room by another staff, pt noted to have R sided gaze, with R hand contracted to chest. Pt not responsive, eyes intermittently opening with blowing respirations.  Initial sat 78%, quickly up to 94 on 2L 02 by [place].  Dr. Jodi MourningZavitz called to bedside, pt now to be admitted, [place],[facility] Notified of change and attempts made to cancel [facility] transport.

## 2016-10-22 NOTE — Discharge Summary (Signed)
Physician Discharge Summary  Peter ReeseDonnie R Sampedro ZOX:[id number] DOB: Dec 31, 1945 DOA: 10/21/2016  PCP: Vinnie LangtonIBRAHIM, SADAOU, NP  Admit date: 10/21/2016 Discharge date: 10/22/2016   Recommendations for Outpatient Follow-Up:   1. SLP to follow for diet changes 2. Increased zonegran    Discharge Diagnosis:   Principal Problem:   Seizure (HCC) Active Problems:   Hemiparesis affecting dominant side as late effect of stroke [facility](HCC)   Essential hypertension   Microcytic anemia   Discharge disposition:  facility  Discharge Condition: Improved.  Diet recommendation: DYS 2, thin liquids  Wound care: None.   History of Present Illness:   [name] is a 71 y.o. male with a past medical history significant for remote traumatic brain injury with resulting epilepsy, HTN and history of stroke with residual right sided weakness who presents with acute change in mental status.  The patient is unable to provide his own history due to dementia and/or post-ictal state (he tells me it is [id number] and [facility]).  Per notes: LSN 5:15P when he wheeled himself from dinner.  At 7:15PM staff found him slumped in his wheelchair, called EMS suspecting stroke.  "After [staff at his facility] trying to arouse hime, he initially began opening his eyes but with conitnued lack of speech. Finally about 15 minutes after findings him he began talking and has gradually returned to baseline. Patient is amnestic to the episode."     Hospital Course by Problem:    Seizure:  -Continue Keppra, Phenytoin -Increase Zonegran per Neuro to 150 BID -MRI negative for acute issue  Hypertension:   -Continue lisinopril  Anemia:  Ferritin elevated -outpatient follow up     Medical Consultants:    Neuro   Discharge Exam:   Vitals:   10/22/16 0145 10/22/16 1023  BP: 117/87 136/87  Pulse: 74 (!) 101  Resp: 11 16  Temp:  98.4 F (36.9 C)   Vitals:   10/22/16 0045 10/22/16 0115  10/22/16 0145 10/22/16 1023  BP: 130/83 117/80 117/87 136/87  Pulse: 82 77 74 (!) 101  Resp: 12 19 11 16   Temp:    98.4 F (36.9 C)  TempSrc:    Oral  SpO2: 100% 100% 100% 99%  Weight:      Height:        Gen:  NAD    The results of significant diagnostics from this hospitalization (including imaging, microbiology, ancillary and laboratory) are listed below for reference.     Procedures and Diagnostic Studies:   Mr Brain Wo Contrast  Result Date: 10/21/2016 CLINICAL DATA:  Dizziness.  Right-sided hemi paresis. EXAM: MRI HEAD WITHOUT CONTRAST TECHNIQUE: Multiplanar, multiecho pulse sequences of the brain and surrounding structures were obtained without intravenous contrast. COMPARISON:  Head CT 10/21/2016 FINDINGS: Brain: No focal diffusion restriction to indicate acute infarct. No intraparenchymal hemorrhage. There is mild multifocal hyperintense T2-weighted signal within the periventricular white matter, most often seen in the setting of chronic microvascular ischemia. No mass lesion or midline shift. There is a septated area of extra-axial fluid within the left posterior fossa that has slowly increased size over multiple prior studies. The midline structures are normal. No age advanced or lobar predominant atrophy. Vascular: Major intracranial arterial and venous sinus flow voids are preserved. No evidence of chronic microhemorrhage or amyloid angiopathy. Skull and upper cervical spine: Remote right frontoparietal craniectomy with skull plate. Sinuses/Orbits: No fluid levels or advanced mucosal thickening. No mastoid effusion. Normal orbits. IMPRESSION: 1. Mild findings of chronic  microvascular disease without acute intracranial abnormality. 2. Remote right frontoparietal craniectomy with metallic closure plate. Electronically Signed   By: Deatra Robinson [name]   On: 10/21/2016 23:30   Ct Head Code Stroke W/o Cm  Result Date: 10/21/2016 CLINICAL DATA:  Code stroke. Found slumped over.  Suspected seizure. EXAM: CT HEAD WITHOUT CONTRAST TECHNIQUE: Contiguous axial images were obtained from the base of the skull through the vertex without intravenous contrast. COMPARISON:  12/19/2014 FINDINGS: Brain: Artifact related to right-sided craniectomy mildly limits assessment of the underlying frontoparietal cortex. Within this limitation, there is no evidence of acute cortical infarct, intracranial hemorrhage, mass, or midline shift. There is mild cerebral atrophy. Cerebellar atrophy and prominent surrounding CSF in the left greater than right posterior fossa are unchanged. Vascular: Calcified atherosclerosis at the skullbase. No hyperdense vessel. Skull: Right-sided craniectomy. Sinuses/Orbits: Visualized paranasal sinuses and mastoid air cells are clear. Unremarkable orbits. Other: None. ASPECTS [facility] Stroke Program Early CT Score) - Ganglionic level infarction (caudate, lentiform nuclei, internal capsule, insula, M1-M3 cortex): 7 - Supraganglionic infarction (M4-M6 cortex): 3 Total score (0-10 with 10 being normal): 10 IMPRESSION: 1. No evidence of acute intracranial abnormality. 2. ASPECTS is 10. These results were called by telephone at the time of interpretation on 10/21/2016 at 8:23 pm to Dr. Amada Jupiter, who verbally acknowledged these results. Electronically Signed   By: Sebastian Ache [name]   On: 10/21/2016 20:41     Labs:   Basic Metabolic Panel:  Recent Labs Lab 10/21/16 2031  NA 137  140  K 4.2  4.0  CL 105  106  CO2 24  GLUCOSE 95  90  BUN 23*  26*  CREATININE 0.85  0.70  CALCIUM 8.9   GFR Estimated Creatinine Clearance: 57.3 mL/min (by C-G formula based on SCr of 0.85 mg/dL). Liver Function Tests:  Recent Labs Lab 10/21/16 2031  AST 23  ALT 15*  ALKPHOS 168*  BILITOT 0.2*  PROT 7.1  ALBUMIN 3.9   No results for input(s): LIPASE, AMYLASE in the last 168 hours. No results for input(s): AMMONIA in the last 168 hours. Coagulation profile  Recent  Labs Lab 10/21/16 2134  INR 1.17    CBC:  Recent Labs Lab 10/21/16 2031  WBC 8.4  NEUTROABS 4.8  HGB 9.7*  11.9*  HCT 29.1*  35.0*  MCV 71.0*  PLT 258   Cardiac Enzymes:  Recent Labs Lab 10/22/16 0417  TROPONINI <0.03   BNP: Invalid input(s): POCBNP CBG:  Recent Labs Lab 10/21/16 2003  GLUCAP 101*   D-Dimer No results for input(s): DDIMER in the last 72 hours. Hgb A1c No results for input(s): HGBA1C in the last 72 hours. Lipid Profile No results for input(s): CHOL, HDL, LDLCALC, TRIG, CHOLHDL, LDLDIRECT in the last 72 hours. Thyroid function studies No results for input(s): TSH, T4TOTAL, T3FREE, THYROIDAB in the last 72 hours.  Invalid input(s): FREET3 Anemia work up  Recent Labs  10/21/16 0301  FERRITIN 1,375*   Microbiology No results found for this or any previous visit (from the past 240 hour(s)).   Discharge Instructions:   Discharge Instructions    Ambulatory referral to Neurology    Complete by:  As directed    An appointment is requested in approximately: 2-3 weeks seizure follow up   Discharge instructions    Complete by:  As directed    DYS 2 thin liquid diet   Increase activity slowly    Complete by:  As directed      Allergies  as of 10/22/2016      Reactions   Carbamazepine Other (See Comments)   As noted on MAR; no specific reaction noted   Tricyclic Antidepressants Other (See Comments)   As noted on MAR; no specific reaction noted      Medication List    TAKE these medications   acetaminophen 325 MG tablet Commonly known as:  TYLENOL Take 650 mg by mouth every morning. AND MAY TAKE AN ADDITIONAL 650 MG EVERY 8 HOURS AS NEEDED FOR PAIN   aspirin EC 81 MG tablet Take 81 mg by mouth daily.   donepezil 5 MG tablet Commonly known as:  ARICEPT Take 5 mg by mouth at bedtime.   DULoxetine 30 MG capsule Commonly known as:  CYMBALTA Take 30 mg by mouth at bedtime.   ferrous sulfate 325 (65 FE) MG tablet Take 325 mg by  mouth 2 (two) times daily.   gabapentin 600 MG tablet Commonly known as:  NEURONTIN Take 600 mg by mouth 2 (two) times daily.   hydrocortisone cream 1 % Apply 1 application topically every 6 (six) hours as needed for itching.   latanoprost 0.005 % ophthalmic solution Commonly known as:  XALATAN Place 1 drop into both eyes at bedtime.   levETIRAcetam 750 MG tablet Commonly known as:  KEPPRA Take 750 mg by mouth 2 (two) times daily.   lisinopril 20 MG tablet Commonly known as:  PRINIVIL,ZESTRIL Take 20 mg by mouth daily.   mirtazapine 7.5 MG tablet Commonly known as:  REMERON Take 7.5 mg by mouth daily.   phenytoin 100 MG ER capsule Commonly known as:  DILANTIN Take 3 capsules (300 mg total) by mouth every evening. What changed:  when to take this   TAB-A-VITE Tabs Take 1 tablet by mouth every morning.   triamcinolone cream 0.1 % Commonly known as:  KENALOG Apply 1 application topically 2 (two) times daily. TO AFFECTED AREAS   UNABLE TO FIND "Mighty Shake": Drink 1 shake by mouth three times a day with meals   vitamin C 500 MG tablet Commonly known as:  ASCORBIC ACID Take 500 mg by mouth 2 (two) times daily.   Vitamin D3 2000 units Tabs Take 4,000 Units by mouth at bedtime.   zonisamide 50 MG capsule Commonly known as:  ZONEGRAN Take 3 capsules (150 mg total) by mouth daily. Start taking on:  10/23/2016 What changed:  how much to take  how to take this  when to take this  additional instructions      Follow-up Information    Vinnie Langton, NP. Call in 3 day(s).   Specialty:  Nurse Practitioner Contact information: [address] [place] [address] [phone]        Lesly Dukes, MD Follow up in 3 week(s).   Specialty:  Neurology Contact information: [address] [place] [address] [phone]            Time coordinating discharge: 35 min  Signed:  Clint Biello Juanetta Gosling   [facility] 10/22/2016,  11:39 AM

## 2016-10-22 NOTE — Evaluation (Signed)
Clinical/Bedside Swallow Evaluation Patient Details  Name: [name] MRN: [id number] Date of Birth: March 09, 1946  Today's Date: 10/22/2016 Time: SLP Start Time (ACUTE ONLY): 0959 SLP Stop Time (ACUTE ONLY): 1012 SLP Time Calculation (min) (ACUTE ONLY): 13 min  Past Medical History:  Past Medical History:  Diagnosis Date  . CVA (cerebral infarction)   . Dilantin toxicity   . 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 History of craniotomy   . Hypertension   . Organic brain syndrome   . Pneumonia   . Polysubstance abuse   . Right hemiparesis (HCC)   . SDH (subdural hematoma) (HCC)   . Seizures (HCC)    Past Surgical History:  Past Surgical History:  Procedure Laterality Date  . COLONOSCOPY WITH PROPOFOL N/A 12/15/2014   Procedure: COLONOSCOPY WITH PROPOFOL;  Surgeon: Charlott RakesVincent Schooler, MD;  [facility];  Service: Endoscopy;  Laterality: N/A;  . CRANIOTOMY     following head trauma   HPI:  71 y.o.malewith a past medical history significant for remote traumatic brain injury with resulting epilepsy, HTN and history of stroke with residual right sided weaknesswho presents with acute change in mental status felt to be related to seizure. Pt with more seizure activity in ED. MRI negative for acute changes. SLP ordered as pt has been on a pureed diet (no report of thickened liquids) PTA.   Assessment / Plan / Recommendation Clinical Impression  Pt reports being changed to a pureed diet approximately two weeks ago due to difficulty chewing, with modifications made in an attempt to increase intake (unclear how accurate of a historian he is). Today pt has no overt s/s of aspiration and although he does have prolonged mastication, his oral clearance is sufficient - particularly when he uses a liquid wash. Frequent eructation was observed as trials continued, suggestive of potential esophageal component. Recommend initiation of Dys 2 diet and thin liquids. SLP will follow acutely for  tolerance. SLP Visit Diagnosis: Dysphagia, unspecified (R13.10)    Aspiration Risk  Mild aspiration risk    Diet Recommendation Dysphagia 2 (Fine chop);Thin liquid   Liquid Administration via: Cup;Straw Medication Administration: Whole meds with liquid Supervision: Patient able to self feed;Full supervision/cueing for compensatory strategies Compensations: Slow rate;Small sips/bites;Follow solids with liquid Postural Changes: Seated upright at 90 degrees;Remain upright for at least 30 minutes after po intake    Other  Recommendations Oral Care Recommendations: Oral care BID   Follow up Recommendations 24 hour supervision/assistance      Frequency and Duration min 2x/week  2 weeks       Prognosis Prognosis for Safe Diet Advancement: Fair Barriers to Reach Goals: Other (Comment) (unclear PLOF)      Swallow Study   General HPI: 71 y.o.malewith a past medical history significant for remote traumatic brain injury with resulting epilepsy, HTN and history of stroke with residual right sided weaknesswho presents with acute change in mental status felt to be related to seizure. Pt with more seizure activity in ED. MRI negative for acute changes. SLP ordered as pt has been on a pureed diet (no report of thickened liquids) PTA. Type of Study: Bedside Swallow Evaluation Previous Swallow Assessment: none in chart Diet Prior to this Study: NPO Temperature Spikes Noted: No Respiratory Status: Nasal cannula History of Recent Intubation: No Behavior/Cognition: Alert;Cooperative;Pleasant mood;Requires cueing Oral Cavity Assessment: Within Functional Limits Oral Care Completed by SLP: No Oral Cavity - Dentition: Adequate natural dentition Vision: Functional for self-feeding Self-Feeding Abilities: Able to feed self;Needs set up Patient Positioning:  Upright in bed Baseline Vocal Quality: Normal    Oral/Motor/Sensory Function     Ice Chips Ice chips: Not tested   Thin Liquid Thin  Liquid: Within functional limits Presentation: Self Fed;Straw    Nectar Thick Nectar Thick Liquid: Not tested   Honey Thick Honey Thick Liquid: Not tested   Puree Puree: Within functional limits Presentation: Self Fed;Spoon   Solid   GO   Solid: Impaired Presentation: Self Fed Oral Phase Impairments: Impaired mastication    Functional Assessment Tool Used: skilled clinical judgment Functional Limitations: Swallowing Swallow Current Status (W0981): At least 20 percent but less than 40 percent impaired, limited or restricted Swallow Goal Status [phone]): At least 20 percent but less than 40 percent impaired, limited or restricted   [name] 10/22/2016,10:31 AM  [name], M.A. CCC-SLP [phone]

## 2016-10-22 NOTE — ED Notes (Signed)
Report called to [facility] [place] She [facility] Script written by Dr. Jodi MourningZavitz, included in d/c packet.  [facility]tar [facility]

## 2016-10-22 NOTE — H&P (Signed)
History and Physical  Patient Name: [name]     ZOX:[id number]    DOB: 10-22-1945    DOA: 10/21/2016 PCP: Vinnie Langton, NP   Patient coming from: [facility]   Chief Complaint: Acute change in mental status  HPI: [name] is a 71 y.o. male with a past medical history significant for remote traumatic brain injury with resulting epilepsy, HTN and history of stroke with residual right sided weakness who presents with acute change in mental status.  The patient is unable to provide his own history due to dementia and/or post-ictal state (he tells me it is 1 and we are at [facility]).  Per notes: LSN 5:15P when he wheeled himself from dinner.  At 7:15PM staff found him slumped in his wheelchair, called EMS suspecting stroke.  "After [staff at his facility] trying to arouse hime, he initially began opening his eyes but with conitnued lack of speech. Finally about 15 minutes after findings him he began talking and has gradually returned to baseline. Patient is amnestic to the episode."    ED course: -Afebrile, heart rate 75, respirations and pulse ox normal, BP 142/75 -Na 137, K 4.2, Cr 0.85, WBC 8.4K, Hgb 9.7 (at baseline) -In the ER, he was evaluated as a code stroke, evaluated by neuro, MRI without stroke -The patient felt lightheaded and fuzzy, but endorsed being back to his baseline -Recommended by neurology to increase Zonegran, but in being discharged, the patient had another witnessed seizure, was given Ativan and TRH were asked to arrange for patient to be observed overnight      ROS: Review of Systems  Constitutional: Negative for chills and fever.  Respiratory: Negative for cough.   Cardiovascular: Negative for chest pain.  Gastrointestinal: Negative for abdominal pain.  Neurological: Negative for headaches.  All other systems reviewed and are negative.   Caveat that patient is unreliable historian.      Past Medical History:  Diagnosis Date  .  CVA (cerebral infarction)   . Dilantin toxicity   . 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 History of craniotomy   . Hypertension   . Organic brain syndrome   . Pneumonia   . Polysubstance abuse   . Right hemiparesis (HCC)   . SDH (subdural hematoma) (HCC)   . Seizures (HCC)     Past Surgical History:  Procedure Laterality Date  . COLONOSCOPY WITH PROPOFOL N/A 12/15/2014   Procedure: COLONOSCOPY WITH PROPOFOL;  Surgeon: Charlott Rakes, MD;  Location: [facility] ENDOSCOPY;  Service: Endoscopy;  Laterality: N/A;  . CRANIOTOMY     following head trauma    Social History: Patient lives at [facility] nursing home.  The patient is wheelchair bound.  He is from [place].  He smokes or was a former smoker.  He was an Personnel officer.    Allergies  Allergen Reactions  . Carbamazepine Other (See Comments)    As noted on MAR; no specific reaction noted  . Tricyclic Antidepressants Other (See Comments)    As noted on MAR; no specific reaction noted    Family history: family history includes Heart attack in his father and mother.  Prior to Admission medications   Medication Sig Start Date End Date Taking? Authorizing Provider  acetaminophen (TYLENOL) 325 MG tablet Take 650 mg by mouth every morning. AND MAY TAKE AN ADDITIONAL 650 MG EVERY 8 HOURS AS NEEDED FOR PAIN   Yes Historical Provider, MD  aspirin EC 81 MG tablet Take 81 mg by mouth daily.  Yes Historical Provider, MD  Cholecalciferol (VITAMIN D3) 2000 UNITS TABS Take 4,000 Units by mouth at bedtime.    Yes Historical Provider, MD  donepezil (ARICEPT) 5 MG tablet Take 5 mg by mouth at bedtime.  08/15/15  Yes Historical Provider, MD  DULoxetine (CYMBALTA) 30 MG capsule Take 30 mg by mouth at bedtime.    Yes Historical Provider, MD  ferrous sulfate 325 (65 FE) MG tablet Take 325 mg by mouth 2 (two) times daily.    Yes Historical Provider, MD  gabapentin (NEURONTIN) 600 MG tablet Take 600 mg by mouth 2 (two) times daily.   Yes Historical Provider, MD    hydrocortisone cream 1 % Apply 1 application topically every 6 (six) hours as needed for itching.   Yes Historical Provider, MD  latanoprost (XALATAN) 0.005 % ophthalmic solution Place 1 drop into both eyes at bedtime.   Yes Historical Provider, MD  levETIRAcetam (KEPPRA) 750 MG tablet Take 750 mg by mouth 2 (two) times daily.   Yes Historical Provider, MD  lisinopril (PRINIVIL,ZESTRIL) 20 MG tablet Take 20 mg by mouth daily.   Yes Historical Provider, MD  mirtazapine (REMERON) 7.5 MG tablet Take 7.5 mg by mouth daily.    Yes Historical Provider, MD  Multiple Vitamin (TAB-A-VITE) TABS Take 1 tablet by mouth every morning.   Yes Historical Provider, MD  phenytoin (DILANTIN) 100 MG ER capsule Take 3 capsules (300 mg total) by mouth every evening. Patient taking differently: Take 300 mg by mouth at bedtime.  07/26/16  Yes York Spaniel, MD  triamcinolone cream (KENALOG) 0.1 % Apply 1 application topically 2 (two) times daily. TO AFFECTED AREAS   Yes Historical Provider, MD  UNABLE TO FIND "Mighty Shake": Drink 1 shake by mouth three times a day with meals   Yes Historical Provider, MD  vitamin C (ASCORBIC ACID) 500 MG tablet Take 500 mg by mouth 2 (two) times daily.   Yes Historical Provider, MD  zonisamide (ZONEGRAN) 50 MG capsule Take three tablets twice daily. 10/22/16   Peter Ohara, MD       Physical Exam: BP 117/87   Pulse 74   Temp 97.9 F (36.6 C)   Resp 11   Ht 5\' 5"  (1.651 m)   Wt 49.4 kg (108 lb 14.5 oz)   SpO2 100%   BMI 18.12 kg/m  General appearance: Thin adult male, asleep but rousable, in no acute distress.   Eyes: Anicteric, conjunctiva pink, lids and lashes normal. PERRL.    ENT: No nasal deformity, discharge, epistaxis.  Hearing normal. OP moist without lesions.   Skin: Warm and dry.   No suspicious rashes or lesions. Cardiac: RRR, nl S1-S2, no murmurs appreciated.  Capillary refill is brisk.   Respiratory: Normal respiratory rate and rhythm.  CTAB without rales  or wheezes. Abdomen: Abdomen soft.  No TTP. No ascites, distension.   MSK: No deformities or effusions.  No cyanosis or clubbing.  There is chronic contractures of the right side. Neuro: Cranial nerves show right sided facial numbness and weakness.  Sensation intact to light touch on left, dim on right. Speech is fluent.  Muscle strength decreased on right, contractures on right noted.    Psych: Sensorium intact and responding to questions, attention normal when roused.  Affect pleasant.  Judgment and insight appear impaired (states he is at [facility], it is [facility]).     Labs on Admission:  I have personally reviewed following labs and imaging studies: CBC:  Recent Labs  Lab 10/21/16 2031  WBC 8.4  NEUTROABS 4.8  HGB 9.7*  11.9*  HCT 29.1*  35.0*  MCV 71.0*  PLT 258   Basic Metabolic Panel:  Recent Labs Lab 10/21/16 2031  NA 137  140  K 4.2  4.0  CL 105  106  CO2 24  GLUCOSE 95  90  BUN 23*  26*  CREATININE 0.85  0.70  CALCIUM 8.9   GFR: Estimated Creatinine Clearance: 57.3 mL/min (by C-G formula based on SCr of 0.85 mg/dL).  Liver Function Tests:  Recent Labs Lab 10/21/16 2031  AST 23  ALT 15*  ALKPHOS 168*  BILITOT 0.2*  PROT 7.1  ALBUMIN 3.9   No results for input(s): LIPASE, AMYLASE in the last 168 hours. No results for input(s): AMMONIA in the last 168 hours. Coagulation Profile:  Recent Labs Lab 10/21/16 2134  INR 1.17   Cardiac Enzymes: No results for input(s): CKTOTAL, CKMB, CKMBINDEX, TROPONINI in the last 168 hours. BNP (last 3 results) No results for input(s): PROBNP in the last 8760 hours. HbA1C: No results for input(s): HGBA1C in the last 72 hours. CBG:  Recent Labs Lab 10/21/16 2003  GLUCAP 101*   Lipid Profile: No results for input(s): CHOL, HDL, LDLCALC, TRIG, CHOLHDL, LDLDIRECT in the last 72 hours. Thyroid Function Tests: No results for input(s): TSH, T4TOTAL, FREET4, T3FREE, THYROIDAB in the last 72 hours. Anemia  Panel: No results for input(s): VITAMINB12, FOLATE, FERRITIN, TIBC, IRON, RETICCTPCT in the last 72 hours. Sepsis Labs:  Invalid input(s): PROCALCITONIN, LACTICIDVEN No results found for this or any previous visit (from the past 240 hour(s)).       Radiological Exams on Admission: Personally reviewed MR brain report, CT head report: Mr Brain Wo Contrast  Result Date: 10/21/2016 CLINICAL DATA:  Dizziness.  Right-sided hemi paresis. EXAM: MRI HEAD WITHOUT CONTRAST TECHNIQUE: Multiplanar, multiecho pulse sequences of the brain and surrounding structures were obtained without intravenous contrast. COMPARISON:  Head CT 10/21/2016 FINDINGS: Brain: No focal diffusion restriction to indicate acute infarct. No intraparenchymal hemorrhage. There is mild multifocal hyperintense T2-weighted signal within the periventricular white matter, most often seen in the setting of chronic microvascular ischemia. No mass lesion or midline shift. There is a septated area of extra-axial fluid within the left posterior fossa that has slowly increased size over multiple prior studies. The midline structures are normal. No age advanced or lobar predominant atrophy. Vascular: Major intracranial arterial and venous sinus flow voids are preserved. No evidence of chronic microhemorrhage or amyloid angiopathy. Skull and upper cervical spine: Remote right frontoparietal craniectomy with skull plate. Sinuses/Orbits: No fluid levels or advanced mucosal thickening. No mastoid effusion. Normal orbits. IMPRESSION: 1. Mild findings of chronic microvascular disease without acute intracranial abnormality. 2. Remote right frontoparietal craniectomy with metallic closure plate. Electronically Signed   By: Deatra RobinsonKevin  Herman [name]   On: 10/21/2016 23:30   Ct Head Code Stroke W/o Cm  Result Date: 10/21/2016 CLINICAL DATA:  Code stroke. Found slumped over. Suspected seizure. EXAM: CT HEAD WITHOUT CONTRAST TECHNIQUE: Contiguous axial images were  obtained from the base of the skull through the vertex without intravenous contrast. COMPARISON:  12/19/2014 FINDINGS: Brain: Artifact related to right-sided craniectomy mildly limits assessment of the underlying frontoparietal cortex. Within this limitation, there is no evidence of acute cortical infarct, intracranial hemorrhage, mass, or midline shift. There is mild cerebral atrophy. Cerebellar atrophy and prominent surrounding CSF in the left greater than right posterior fossa are unchanged. Vascular: Calcified atherosclerosis at the skullbase.  No hyperdense vessel. Skull: Right-sided craniectomy. Sinuses/Orbits: Visualized paranasal sinuses and mastoid air cells are clear. Unremarkable orbits. Other: None. ASPECTS [facility] Stroke Program Early CT Score) - Ganglionic level infarction (caudate, lentiform nuclei, internal capsule, insula, M1-M3 cortex): 7 - Supraganglionic infarction (M4-M6 cortex): 3 Total score (0-10 with 10 being normal): 10 IMPRESSION: 1. No evidence of acute intracranial abnormality. 2. ASPECTS is 10. These results were called by telephone at the time of interpretation on 10/21/2016 at 8:23 pm to Dr. Amada Jupiter, who verbally acknowledged these results. Electronically Signed   By: Sebastian Ache [name]   On: 10/21/2016 20:41    EKG: Independently reviewed. Rate 78, QTc 439, new TWI.    Assessment/Plan  1. Seizure:  -Seizure precautions -Neuro checks q2hrs -Continue Keppra, Phenytoin -Increase Zonegran per Neuro to 150 BID   2. Hypertension:   Mildly hypertensive at admission. -Continue lisinopril  3. Anemia:  Presumably iron deficient.  Microcytic.  Present previously.   -Continue iron -Check ferritin  4. Other medications:  -Continue gabapentin, Duloxetine, mirtazapine, donepezil      DVT prophylaxis: Lovenox  Code Status: FULL  Family Communication: None present  Disposition Plan: Anticipate observing overnight on telemetry, monitor for neuro changes, if stable  tomorrow, re-eval by Neurology and adjustments of medicines and further dispo per Neurology. Consults called: Neurology Admission status: OBS At the point of initial evaluation, it is my clinical opinion that admission for OBSERVATION is reasonable and necessary because the patient's presenting complaints in the context of their chronic conditions represent sufficient risk of deterioration or significant morbidity to constitute reasonable grounds for close observation in the hospital setting, but that the patient may be medically stable for discharge from the hospital within 24 to 48 hours.    Medical decision making: Patient seen at 2:16 AM on 10/22/2016.  The patient was discussed with Dr. Rhunette Croft.  What exists of the patient's chart was reviewed in depth and summarized above.  Clinical condition: stable.        Alberteen Sam [facility] Pager [phone]

## 2016-10-22 NOTE — ED Notes (Signed)
Dr. Maryfrances Bunnellanford at bedside. No further seizure activity noted. Pt now awake and alert, pt has slower than normal response. Pt medicated with Ativan.

## 2016-10-22 NOTE — Clinical Social Work Note (Signed)
Mr. [name] is medically stable for discharge today and [facility] transported by ambulance. Patient's sister, Clide CliffWanda Poole [phone]) contacted and advised of d/c back to facility. Discharge clinicals transmitted to facility.   Genelle BalVanessa Akira Adelsberger, MSW, LCSW Licensed Clinical Social Worker Clinical Social Work Department [facility] [phone]

## 2017-10-17 ENCOUNTER — Other Ambulatory Visit: Payer: Self-pay | Admitting: Nephrology

## 2017-10-17 DIAGNOSIS — N281 Cyst of kidney, acquired: Secondary | ICD-10-CM

## 2017-10-21 ENCOUNTER — Ambulatory Visit
Admission: RE | Admit: 2017-10-21 | Discharge: 2017-10-21 | Disposition: A | Discharge status: Home / Self Care | Payer: Medicare Other | Source: Ambulatory Visit | Attending: Nephrology | Admitting: Nephrology

## 2017-10-21 DIAGNOSIS — N281 Cyst of kidney, acquired: Secondary | ICD-10-CM

## 2020-01-25 ENCOUNTER — Encounter (HOSPITAL_COMMUNITY): Payer: Self-pay

## 2020-01-25 ENCOUNTER — Emergency Department (HOSPITAL_COMMUNITY)
Admission: EM | Admit: 2020-01-25 | Discharge: 2020-01-26 | Disposition: A | Discharge status: Home / Self Care | Payer: Medicare Other | Attending: Emergency Medicine | Admitting: Emergency Medicine

## 2020-01-25 DIAGNOSIS — Z79899 Other long term (current) drug therapy: Secondary | ICD-10-CM | POA: Insufficient documentation

## 2020-01-25 DIAGNOSIS — F17210 Nicotine dependence, cigarettes, uncomplicated: Secondary | ICD-10-CM | POA: Diagnosis not present

## 2020-01-25 DIAGNOSIS — I10 Essential (primary) hypertension: Secondary | ICD-10-CM | POA: Diagnosis not present

## 2020-01-25 DIAGNOSIS — M25571 Pain in right ankle and joints of right foot: Secondary | ICD-10-CM | POA: Diagnosis present

## 2020-01-25 DIAGNOSIS — R569 Unspecified convulsions: Secondary | ICD-10-CM | POA: Diagnosis not present

## 2020-01-25 DIAGNOSIS — I69351 Hemiplegia and hemiparesis following cerebral infarction affecting right dominant side: Secondary | ICD-10-CM | POA: Insufficient documentation

## 2020-01-25 DIAGNOSIS — L03115 Cellulitis of right lower limb: Secondary | ICD-10-CM | POA: Insufficient documentation

## 2020-01-25 NOTE — ED Triage Notes (Signed)
Pt bib [facility] from [facility] for eval of R ankle edema. Pt denies injury and trauma.

## 2020-01-26 ENCOUNTER — Emergency Department (HOSPITAL_COMMUNITY): Payer: Medicare Other

## 2020-01-26 DIAGNOSIS — L03115 Cellulitis of right lower limb: Secondary | ICD-10-CM | POA: Diagnosis not present

## 2020-01-26 MED ORDER — LISINOPRIL 10 MG PO TABS
10.0000 mg | ORAL_TABLET | Freq: Once | ORAL | Status: AC
  Administered 2020-01-26: 10 mg via ORAL
  Filled 2020-01-26: qty 1

## 2020-01-26 MED ORDER — LEVETIRACETAM 750 MG PO TABS
750.0000 mg | ORAL_TABLET | Freq: Once | ORAL | Status: AC
Start: 2020-01-26 — End: 2020-01-26
  Administered 2020-01-26: 12:00:00 750 mg via ORAL
  Filled 2020-01-26: qty 1

## 2020-01-26 MED ORDER — GABAPENTIN 300 MG PO CAPS
600.0000 mg | ORAL_CAPSULE | Freq: Once | ORAL | Status: AC
  Administered 2020-01-26: 12:00:00 600 mg via ORAL
  Filled 2020-01-26: qty 2

## 2020-01-26 MED ORDER — DOXYCYCLINE HYCLATE 100 MG PO TABS
100.0000 mg | ORAL_TABLET | Freq: Once | ORAL | Status: AC
  Administered 2020-01-26: 12:00:00 100 mg via ORAL
  Filled 2020-01-26: qty 1

## 2020-01-26 MED ORDER — DOXYCYCLINE HYCLATE 100 MG PO CAPS
100.0000 mg | ORAL_CAPSULE | Freq: Two times a day (BID) | ORAL | 0 refills | Status: AC
Start: 2020-01-26 — End: 2020-02-02

## 2020-01-26 NOTE — ED Notes (Signed)
Called ptar for pt transport  

## 2020-01-26 NOTE — ED Notes (Signed)
Updated daughter [name]

## 2020-01-26 NOTE — Discharge Instructions (Signed)
Mr Pipe had an xray of his ankle in the ER which did not show a broken bone or fracture.  I felt his presentation can be consistent with cellulitis or gout.  I started him on doxycycline 100 mg twice daily, and we gave him his first dose this morning in the [facility] ER.  His next dose is due this evening.  Please have his doctor re-evaluate his leg in 2-3 days to ensure the redness and warmth are improving.  We did not see signs of bone infection (osteomyelitis) on his xrays today.  We also gave him his morning Keppra, Lisinopril, and Gabapentin dose in our ER.

## 2020-01-26 NOTE — ED Provider Notes (Signed)
[facility] EMERGENCY DEPARTMENT Provider Note   CSN: [id number] Arrival date & time: 01/25/20  1859     History Chief Complaint  Patient presents with  . Ankle Pain    [name] is a 74 y.o. male history of CVA, gait disorder, dementia, presenting from [facility] home for evaluation of right ankle edema.  Patient believes this has been ongoing for about 3 days.  He reports pain and swelling in his right ankle. He cannot recall any trauma (his memory is not good).  He denies fevers or chills.  He denies hx of diabetes  HPI     Past Medical History:  Diagnosis Date  . CVA (cerebral infarction)   . Dilantin toxicity   . 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 History of craniotomy   . Hypertension   . Organic brain syndrome   . Pneumonia   . Polysubstance abuse ([place])   . Right hemiparesis ([place])   . SDH (subdural hematoma) ([place])   . Seizures [facility])     Patient Active Problem List   Diagnosis Date Noted  . Seizure ([place]) 10/22/2016  . Essential hypertension 10/22/2016  . Microcytic anemia 10/22/2016  . Colon cancer screening 12/15/2014  . 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 Seizure disorder ([place]) 05/20/2013  . Hemiparesis affecting dominant side as late effect of stroke ([place]) 05/20/2013  . Tobacco use disorder 05/20/2013    Past Surgical History:  Procedure Laterality Date  . COLONOSCOPY WITH PROPOFOL N/A 12/15/2014   Procedure: COLONOSCOPY WITH PROPOFOL;  Surgeon: Wilford Corner, MD;  Location: [facility] ENDOSCOPY;  Service: Endoscopy;  Laterality: N/A;  . CRANIOTOMY     following head trauma       Family History  Problem Relation Age of Onset  . Heart attack Mother   . Heart attack Father     Social History   Tobacco Use  . Smoking status: Current Every Day Smoker    Packs/day: 1.00  . Smokeless tobacco: Never Used  Substance Use Topics  . Alcohol use: No    Alcohol/week: 0.0 standard drinks  . Drug use: No    Home Medications Prior to  Admission medications   Medication Sig Start Date End Date Taking? Authorizing Provider  acetaminophen (TYLENOL) 325 MG tablet Take 650 mg by mouth every morning. AND MAY TAKE AN ADDITIONAL 650 MG EVERY 8 HOURS AS NEEDED FOR PAIN   Yes [provider]  aspirin EC 81 MG tablet Take 81 mg by mouth daily.   Yes [provider]  Cholecalciferol (VITAMIN D3) 10 MCG (400 UNIT) tablet Take 800 Units by mouth daily.    Yes [provider]  DULoxetine (CYMBALTA) 30 MG capsule Take 30 mg by mouth at bedtime.    Yes [provider]  Emollient (AQUAPHOR ADVANCED THERAPY) OINT Apply 1 application topically daily. Spread topically to bilateral lower extremities once daily   Yes [provider]  gabapentin (NEURONTIN) 600 MG tablet Take 600 mg by mouth 2 (two) times daily.   Yes [provider]  latanoprost (XALATAN) 0.005 % ophthalmic solution Place 1 drop into both eyes at bedtime.   Yes [provider]  levETIRAcetam (KEPPRA) 750 MG tablet Take 750 mg by mouth 2 (two) times daily.   Yes [provider]  lisinopril (ZESTRIL) 10 MG tablet Take 10 mg by mouth daily.    Yes [provider]  Menthol, Topical Analgesic, 4 % GEL Apply 1 application topically in the morning and  at bedtime. Apply to both shoulders   Yes [provider]  mirtazapine (REMERON) 7.5 MG tablet Take 7.5 mg by mouth daily.    Yes [provider]  Multiple Vitamin (TAB-A-VITE) TABS Take 1 tablet by mouth every morning.   Yes [provider]  phenytoin (DILANTIN) 100 MG ER capsule Take 3 capsules (300 mg total) by mouth every evening. Patient taking differently: Take 300 mg by mouth at bedtime.  07/26/16  Yes York Spaniel, MD  polyethylene glycol (MIRALAX / GLYCOLAX) 17 g packet Take 17 g by mouth daily as needed for mild constipation.   Yes [provider]  UNABLE TO FIND "Mighty Shake": Drink 1 shake by mouth three times a  day with meals   Yes [provider]  zonisamide (ZONEGRAN) 50 MG capsule Take 3 capsules (150 mg total) by mouth daily. Patient taking differently: Take 100 mg by mouth in the morning and at bedtime.  10/23/16  Yes Vann, Jessica U, DO  donepezil (ARICEPT) 5 MG tablet Take 5 mg by mouth at bedtime.  08/15/15   [provider]  doxycycline (VIBRAMYCIN) 100 MG capsule Take 1 capsule (100 mg total) by mouth 2 (two) times daily for 7 days. 01/26/20 02/02/20  Terald Sleeper, MD    Allergies    Carbamazepine and Tricyclic antidepressants  Review of Systems   Review of Systems  Unable to perform ROS: Dementia (level 5 caveat)    Physical Exam Updated Vital Signs BP 108/80   Pulse (!) 54   Temp 98.3 F (36.8 C) (Oral)   Resp 17   SpO2 100%   Physical Exam Vitals and nursing note reviewed.  Constitutional:      Appearance: He is well-developed.     Comments: Thin  HENT:     Head: Normocephalic and atraumatic.  Eyes:     Conjunctiva/sclera: Conjunctivae normal.  Cardiovascular:     Rate and Rhythm: Normal rate and regular rhythm.     Pulses: Normal pulses.  Pulmonary:     Effort: Pulmonary effort is normal. No respiratory distress.  Abdominal:     General: There is no distension.     Palpations: Abdomen is soft.  Musculoskeletal:     Cervical back: Neck supple.     Comments: Warmth and edema of the right ankle Minimal pain with flexion of the ankle and palpation of the midfoot No crepitus  Skin:    General: Skin is warm and dry.     Comments: Cracked, dry skin on soles of feet, no open or borrowing ulcerations  Neurological:     Mental Status: He is alert.     ED Results / Procedures / Treatments   Labs (all labs ordered are listed, but only abnormal results are displayed) Labs Reviewed - No data to display  EKG None  Radiology DG Ankle Complete Right  Result Date: 01/26/2020 CLINICAL DATA:  Swelling and warmth of right ankle. No known trauma.  EXAM: RIGHT ANKLE - COMPLETE 3+ VIEW COMPARISON:  None. FINDINGS: There is diffuse soft tissue swelling. Mild diffuse osteopenia. Small plantar heel spur. No acute fracture or dislocation. IMPRESSION: 1. Diffuse soft tissue swelling. 2. No acute bone abnormality. Electronically Signed   By: Signa Kell [name]   On: 01/26/2020 10:14    Procedures Procedures (including critical care time)  Medications Ordered in ED Medications  doxycycline (VIBRA-TABS) tablet 100 mg (100 mg Oral Given 01/26/20 1150)  lisinopril (ZESTRIL) tablet 10 mg (10 mg Oral  Given 01/26/20 1150)  levETIRAcetam (KEPPRA) tablet 750 mg (750 mg Oral Given 01/26/20 1149)  gabapentin (NEURONTIN) capsule 600 mg (600 mg Oral Given 01/26/20 1150)    ED Course  I have reviewed the triage vital signs and the nursing notes.  Pertinent labs & imaging results that were available during my care of the patient were reviewed by me and considered in my medical decision making (see chart for details).  74 yo male presenting to ED from nursing home with warmth, swelling of right ankle for 3 days Patient has minimal tenderness on exam, but has dementia as well, difficult to determine true pain level  Xrays of the ankle ordered to evaluate for fracture If not acute fracture, I would be inclined to treat with antibiotics given the warmth of the leg  Alternatively this could be gout or pseudogout  Vitals wnl here, afebrile, doubt this is a septic joint as he has no significant tenderness here Doubtful of osteomyelitis with no open wound or ulceration  Clinical Course as of Jan 26 1343  Tue Jan 26, 2020  1021 IMPRESSION: 1. Diffuse soft tissue swelling. 2. No acute bone abnormality.   [MT]  1023 No acute fracture on xray.  No clear evidence of osteomyelitis on films.  We'll treat with 7 days of doxycycline for cellulitis, discharge back to nursing facility with request to monitor redness and swelling   [MT]  1028 No new complaints,  vitals stable, okay for discharge on doxy   [MT]    Clinical Course User Index [MT] Kattie Santoyo, Kermit Balo, MD    Final Clinical Impression(s) / ED Diagnoses Final diagnoses:  Cellulitis of right lower extremity    Rx / DC Orders ED Discharge Orders         Ordered    doxycycline (VIBRAMYCIN) 100 MG capsule  2 times daily     Discontinue  Reprint     01/26/20 1027           Terald Sleeper, MD 01/26/20 1344

## 2020-06-28 ENCOUNTER — Other Ambulatory Visit: Payer: Self-pay

## 2020-06-28 ENCOUNTER — Encounter (HOSPITAL_COMMUNITY): Payer: Self-pay | Admitting: Emergency Medicine

## 2020-06-28 ENCOUNTER — Inpatient Hospital Stay (HOSPITAL_COMMUNITY)
Admission: EM | Admit: 2020-06-28 | Discharge: 2020-07-01 | DRG: 811 | Disposition: A | Discharge status: Skilled Nursing Facility | Payer: Medicare Other | Source: Skilled Nursing Facility | Attending: Internal Medicine | Admitting: Internal Medicine

## 2020-06-28 ENCOUNTER — Emergency Department (HOSPITAL_COMMUNITY): Payer: Medicare Other

## 2020-06-28 DIAGNOSIS — R55 Syncope and collapse: Secondary | ICD-10-CM | POA: Diagnosis not present

## 2020-06-28 DIAGNOSIS — I10 Essential (primary) hypertension: Secondary | ICD-10-CM | POA: Diagnosis present

## 2020-06-28 DIAGNOSIS — L8990 Pressure ulcer of unspecified site, unspecified stage: Secondary | ICD-10-CM | POA: Insufficient documentation

## 2020-06-28 DIAGNOSIS — Z20822 Contact with and (suspected) exposure to covid-19: Secondary | ICD-10-CM | POA: Diagnosis present

## 2020-06-28 DIAGNOSIS — R569 Unspecified convulsions: Secondary | ICD-10-CM

## 2020-06-28 DIAGNOSIS — G40909 Epilepsy, unspecified, not intractable, without status epilepticus: Secondary | ICD-10-CM | POA: Diagnosis present

## 2020-06-28 DIAGNOSIS — R54 Age-related physical debility: Secondary | ICD-10-CM | POA: Diagnosis present

## 2020-06-28 DIAGNOSIS — W19XXXA Unspecified fall, initial encounter: Secondary | ICD-10-CM

## 2020-06-28 DIAGNOSIS — W010XXA Fall on same level from slipping, tripping and stumbling without subsequent striking against object, initial encounter: Secondary | ICD-10-CM | POA: Diagnosis present

## 2020-06-28 DIAGNOSIS — L89892 Pressure ulcer of other site, stage 2: Secondary | ICD-10-CM | POA: Diagnosis present

## 2020-06-28 DIAGNOSIS — I69351 Hemiplegia and hemiparesis following cerebral infarction affecting right dominant side: Secondary | ICD-10-CM

## 2020-06-28 DIAGNOSIS — D509 Iron deficiency anemia, unspecified: Secondary | ICD-10-CM | POA: Diagnosis not present

## 2020-06-28 DIAGNOSIS — E43 Unspecified severe protein-calorie malnutrition: Secondary | ICD-10-CM | POA: Diagnosis present

## 2020-06-28 DIAGNOSIS — Z993 Dependence on wheelchair: Secondary | ICD-10-CM

## 2020-06-28 DIAGNOSIS — Z681 Body mass index (BMI) 19 or less, adult: Secondary | ICD-10-CM

## 2020-06-28 DIAGNOSIS — R64 Cachexia: Secondary | ICD-10-CM | POA: Diagnosis present

## 2020-06-28 DIAGNOSIS — R7989 Other specified abnormal findings of blood chemistry: Secondary | ICD-10-CM | POA: Diagnosis present

## 2020-06-28 DIAGNOSIS — I69359 Hemiplegia and hemiparesis following cerebral infarction affecting unspecified side: Secondary | ICD-10-CM

## 2020-06-28 DIAGNOSIS — D638 Anemia in other chronic diseases classified elsewhere: Secondary | ICD-10-CM | POA: Diagnosis present

## 2020-06-28 DIAGNOSIS — G894 Chronic pain syndrome: Secondary | ICD-10-CM | POA: Diagnosis present

## 2020-06-28 DIAGNOSIS — S0511XA Contusion of eyeball and orbital tissues, right eye, initial encounter: Secondary | ICD-10-CM

## 2020-06-28 DIAGNOSIS — F32A Depression, unspecified: Secondary | ICD-10-CM | POA: Diagnosis present

## 2020-06-28 DIAGNOSIS — F419 Anxiety disorder, unspecified: Secondary | ICD-10-CM | POA: Diagnosis present

## 2020-06-28 DIAGNOSIS — Z8249 Family history of ischemic heart disease and other diseases of the circulatory system: Secondary | ICD-10-CM

## 2020-06-28 DIAGNOSIS — E876 Hypokalemia: Secondary | ICD-10-CM | POA: Diagnosis present

## 2020-06-28 DIAGNOSIS — Z888 Allergy status to other drugs, medicaments and biological substances status: Secondary | ICD-10-CM

## 2020-06-28 DIAGNOSIS — F17210 Nicotine dependence, cigarettes, uncomplicated: Secondary | ICD-10-CM | POA: Diagnosis present

## 2020-06-28 DIAGNOSIS — N3090 Cystitis, unspecified without hematuria: Secondary | ICD-10-CM | POA: Diagnosis present

## 2020-06-28 DIAGNOSIS — D649 Anemia, unspecified: Secondary | ICD-10-CM

## 2020-06-28 DIAGNOSIS — I959 Hypotension, unspecified: Secondary | ICD-10-CM | POA: Diagnosis present

## 2020-06-28 DIAGNOSIS — R269 Unspecified abnormalities of gait and mobility: Secondary | ICD-10-CM

## 2020-06-28 DIAGNOSIS — H1131 Conjunctival hemorrhage, right eye: Secondary | ICD-10-CM | POA: Diagnosis present

## 2020-06-28 DIAGNOSIS — K5909 Other constipation: Secondary | ICD-10-CM | POA: Diagnosis present

## 2020-06-28 DIAGNOSIS — Y92129 Unspecified place in nursing home as the place of occurrence of the external cause: Secondary | ICD-10-CM

## 2020-06-28 DIAGNOSIS — Z79899 Other long term (current) drug therapy: Secondary | ICD-10-CM

## 2020-06-28 DIAGNOSIS — Z23 Encounter for immunization: Secondary | ICD-10-CM

## 2020-06-28 DIAGNOSIS — Z7982 Long term (current) use of aspirin: Secondary | ICD-10-CM

## 2020-06-28 DIAGNOSIS — L89152 Pressure ulcer of sacral region, stage 2: Secondary | ICD-10-CM | POA: Diagnosis present

## 2020-06-28 LAB — CBC
HCT: 23.2 % — ABNORMAL LOW (ref 39.0–52.0)
HCT: 24.8 % — ABNORMAL LOW (ref 39.0–52.0)
Hemoglobin: 7.5 g/dL — ABNORMAL LOW (ref 13.0–17.0)
Hemoglobin: 8.1 g/dL — ABNORMAL LOW (ref 13.0–17.0)
MCH: 23.3 pg — ABNORMAL LOW (ref 26.0–34.0)
MCH: 23.3 pg — ABNORMAL LOW (ref 26.0–34.0)
MCHC: 32.3 g/dL (ref 30.0–36.0)
MCHC: 32.7 g/dL (ref 30.0–36.0)
MCV: 72 fL — ABNORMAL LOW (ref 80.0–100.0)
MCV: 71.3 fL — ABNORMAL LOW (ref 80.0–100.0)
Platelets: 277 10*3/uL (ref 150–400)
Platelets: 274 10*3/uL (ref 150–400)
RBC: 3.22 MIL/uL — ABNORMAL LOW (ref 4.22–5.81)
RBC: 3.48 MIL/uL — ABNORMAL LOW (ref 4.22–5.81)
RDW: 17.3 % — ABNORMAL HIGH (ref 11.5–15.5)
RDW: 17 % — ABNORMAL HIGH (ref 11.5–15.5)
WBC: 8.7 10*3/uL (ref 4.0–10.5)
WBC: 8.6 10*3/uL (ref 4.0–10.5)
nRBC: 0 % (ref 0.0–0.2)
nRBC: 0 % (ref 0.0–0.2)

## 2020-06-28 LAB — IRON AND TIBC
Iron: 26 ug/dL — ABNORMAL LOW (ref 45–182)
Saturation Ratios: 16 % — ABNORMAL LOW (ref 17.9–39.5)
TIBC: 161 ug/dL — ABNORMAL LOW (ref 250–450)
UIBC: 135 ug/dL

## 2020-06-28 LAB — POC OCCULT BLOOD, ED: Fecal Occult Bld: NEGATIVE

## 2020-06-28 LAB — BASIC METABOLIC PANEL
Anion gap: 9 (ref 5–15)
BUN: 19 mg/dL (ref 8–23)
CO2: 20 mmol/L — ABNORMAL LOW (ref 22–32)
Calcium: 8.6 mg/dL — ABNORMAL LOW (ref 8.9–10.3)
Chloride: 110 mmol/L (ref 98–111)
Creatinine, Ser: 0.81 mg/dL (ref 0.61–1.24)
GFR, Estimated: >60 mL/min (ref >60)
Glucose, Bld: 103 mg/dL — ABNORMAL HIGH (ref 70–99)
Potassium: 3.6 mmol/L (ref 3.5–5.1)
Sodium: 139 mmol/L (ref 135–145)

## 2020-06-28 LAB — RESPIRATORY PANEL BY RT PCR (FLU A&B, COVID)
Influenza A by PCR: NEGATIVE
Influenza B by PCR: NEGATIVE
SARS Coronavirus 2 by RT PCR: NEGATIVE

## 2020-06-28 LAB — PROTIME-INR
INR: 1.2 (ref 0.8–1.2)
Prothrombin Time: 14.9 seconds (ref 11.4–15.2)

## 2020-06-28 LAB — ABO/RH: ABO/RH(D): A POS

## 2020-06-28 LAB — FOLATE: Folate: 29.5 ng/mL (ref >5.9)

## 2020-06-28 LAB — TYPE AND SCREEN
ABO/RH(D): A POS
Antibody Screen: NEGATIVE

## 2020-06-28 LAB — VITAMIN B12: Vitamin B-12: 348 pg/mL (ref 180–914)

## 2020-06-28 LAB — PHENYTOIN LEVEL, TOTAL: Phenytoin Lvl: 26.3 ug/mL — ABNORMAL HIGH (ref 10.0–20.0)

## 2020-06-28 LAB — CBG MONITORING, ED: Glucose-Capillary: 101 mg/dL — ABNORMAL HIGH (ref 70–99)

## 2020-06-28 LAB — FERRITIN: Ferritin: 1483 ng/mL — ABNORMAL HIGH (ref 24–336)

## 2020-06-28 MED ORDER — ACETAMINOPHEN 650 MG RE SUPP: 650.0000 mg | Freq: Four times a day (QID) | RECTAL | Status: DC | PRN

## 2020-06-28 MED ORDER — ZONISAMIDE 25 MG PO CAPS
150.0000 mg | ORAL_CAPSULE | Freq: Every day | ORAL | Status: DC
  Administered 2020-06-29 – 2020-07-01 (×3): 150 mg via ORAL
  Filled 2020-06-28 (×3): qty 6

## 2020-06-28 MED ORDER — BOOST / RESOURCE BREEZE PO LIQD CUSTOM
1.0000 | Freq: Three times a day (TID) | ORAL | Status: DC
  Administered 2020-06-28 – 2020-07-01 (×8): 1 via ORAL

## 2020-06-28 MED ORDER — GABAPENTIN 600 MG PO TABS
600.0000 mg | ORAL_TABLET | Freq: Two times a day (BID) | ORAL | Status: DC
  Administered 2020-06-28 – 2020-07-01 (×6): 600 mg via ORAL
  Filled 2020-06-28 (×7): qty 1

## 2020-06-28 MED ORDER — ACETAMINOPHEN 325 MG PO TABS: 650.0000 mg | ORAL_TABLET | Freq: Four times a day (QID) | ORAL | Status: DC | PRN

## 2020-06-28 MED ORDER — LATANOPROST 0.005 % OP SOLN
1.0000 [drp] | Freq: Every day | OPHTHALMIC | Status: DC
  Administered 2020-06-28 – 2020-06-30 (×3): 1 [drp] via OPHTHALMIC
  Filled 2020-06-28: qty 2.5

## 2020-06-28 MED ORDER — SODIUM CHLORIDE 0.9 % IV SOLN: 10.0000 mL/h | Freq: Once | INTRAVENOUS | Status: DC

## 2020-06-28 MED ORDER — ONDANSETRON HCL 4 MG PO TABS: 4.0000 mg | ORAL_TABLET | Freq: Four times a day (QID) | ORAL | Status: DC | PRN

## 2020-06-28 MED ORDER — SODIUM CHLORIDE 0.9% FLUSH
3.0000 mL | Freq: Two times a day (BID) | INTRAVENOUS | Status: DC
  Administered 2020-06-29 – 2020-07-01 (×5): 3 mL via INTRAVENOUS

## 2020-06-28 MED ORDER — DULOXETINE HCL 30 MG PO CPEP
30.0000 mg | ORAL_CAPSULE | Freq: Every day | ORAL | Status: DC
  Administered 2020-06-28 – 2020-06-30 (×3): 30 mg via ORAL
  Filled 2020-06-28 (×4): qty 1

## 2020-06-28 MED ORDER — ONDANSETRON HCL 4 MG/2ML IJ SOLN: 4.0000 mg | Freq: Four times a day (QID) | INTRAMUSCULAR | Status: DC | PRN

## 2020-06-28 MED ORDER — POLYETHYLENE GLYCOL 3350 17 G PO PACK: 17.0000 g | PACK | Freq: Every day | ORAL | Status: DC | PRN

## 2020-06-28 MED ORDER — MIRTAZAPINE 7.5 MG PO TABS
7.5000 mg | ORAL_TABLET | Freq: Every day | ORAL | Status: DC
  Administered 2020-06-29 – 2020-07-01 (×3): 7.5 mg via ORAL
  Filled 2020-06-28 (×3): qty 1

## 2020-06-28 MED ORDER — LEVETIRACETAM 750 MG PO TABS
750.0000 mg | ORAL_TABLET | Freq: Two times a day (BID) | ORAL | Status: DC
  Administered 2020-06-28 – 2020-07-01 (×6): 750 mg via ORAL
  Filled 2020-06-28 (×7): qty 1

## 2020-06-28 MED ORDER — FERROUS SULFATE 325 (65 FE) MG PO TABS
325.0000 mg | ORAL_TABLET | Freq: Three times a day (TID) | ORAL | Status: DC
  Administered 2020-06-29 – 2020-07-01 (×9): 325 mg via ORAL
  Filled 2020-06-28 (×9): qty 1

## 2020-06-28 MED ORDER — SODIUM CHLORIDE 0.9 % IV BOLUS
1000.0000 mL | Freq: Once | INTRAVENOUS | Status: AC
  Administered 2020-06-28: 1000 mL via INTRAVENOUS

## 2020-06-28 NOTE — Progress Notes (Signed)
Arrived to patient's room. MD at bedside discussing POC. MD declined PIV start at this time. Will notify nurse if 2nd PIV site needed after discussing POC with patient. Peter Morrow, RN VAST

## 2020-06-28 NOTE — ED Notes (Signed)
Pt placed on a bedpan.  

## 2020-06-28 NOTE — ED Provider Notes (Signed)
[facility] EMERGENCY DEPARTMENT Provider Note   CSN: [id number] Arrival date & time: 06/28/20  1121     History Chief Complaint  Patient presents with  . Fall  . Eye Injury  . Leg Pain    [name] is a 74 y.o. male.  The history is provided by the patient and medical records. No language interpreter was used.  Fall  Eye Injury  Leg Pain    74 year old male significant history of polysubstance abuse, seizures, prior stroke with right-sided deficit pain, hypertension brought here via EMS from home for evaluation of a recent fall.  History is a bit difficult to obtain due to patient's baseline mental status.  Patient report yesterday he was trying to get out of his wheelchair to get into his bed.  States that he had on socks only and the floor was slippery thus causing fell forward striking his face against the ground.  He denies any loss of consciousness but reported he was on the ground for approximately 40 minutes before his roommate came in to help him back to bed.  He did suffer bruises to the right side of face and today nurse wants him to be brought to the ER for evaluation.  He denies any prior symptoms prior to the fall.  He denies feeling nauseous or having headache or having visual changes.  Denies neck pain chest pain trouble breathing abdominal pain.  She did endorse some left leg cramping from being on the floor but that has since improved.  No recent medication changes no recent sickness.  States he has been eating and drinking fine.  EMS noted that patient is not on any blood thinner medication.  Report that his initial blood pressure was 90/60 and patient received 200 cc of normal saline with improvement of his blood pressure.  Past Medical History:  Diagnosis Date  . CVA (cerebral infarction)   . Dilantin toxicity   . 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 History of craniotomy   . Hypertension   . Organic brain syndrome   . Pneumonia   .  Polysubstance abuse (HCC)   . Right hemiparesis (HCC)   . SDH (subdural hematoma) (HCC)   . Seizures [facility])     Patient Active Problem List   Diagnosis Date Noted  . Seizure (HCC) 10/22/2016  . Essential hypertension 10/22/2016  . Microcytic anemia 10/22/2016  . Colon cancer screening 12/15/2014  . 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 Seizure disorder (HCC) 05/20/2013  . Hemiparesis affecting dominant side as late effect of stroke (HCC) 05/20/2013  . Tobacco use disorder 05/20/2013    Past Surgical History:  Procedure Laterality Date  . COLONOSCOPY WITH PROPOFOL N/A 12/15/2014   Procedure: COLONOSCOPY WITH PROPOFOL;  Surgeon: Charlott Rakes, MD;  Location: [facility] ENDOSCOPY;  Service: Endoscopy;  Laterality: N/A;  . CRANIOTOMY     following head trauma       Family History  Problem Relation Age of Onset  . Heart attack Mother   . Heart attack Father     Social History   Tobacco Use  . Smoking status: Current Every Day Smoker    Packs/day: 1.00  . Smokeless tobacco: Never Used  Substance Use Topics  . Alcohol use: No    Alcohol/week: 0.0 standard drinks  . Drug use: No    Home Medications Prior to Admission medications   Medication Sig Start Date End Date Taking? Authorizing Provider  acetaminophen (TYLENOL) 325 MG tablet Take 650 mg by  mouth every morning. AND MAY TAKE AN ADDITIONAL 650 MG EVERY 8 HOURS AS NEEDED FOR PAIN    [provider]  aspirin EC 81 MG tablet Take 81 mg by mouth daily.    [provider]  Cholecalciferol (VITAMIN D3) 10 MCG (400 UNIT) tablet Take 800 Units by mouth daily.     [provider]  donepezil (ARICEPT) 5 MG tablet Take 5 mg by mouth at bedtime.  08/15/15   [provider]  DULoxetine (CYMBALTA) 30 MG capsule Take 30 mg by mouth at bedtime.     [provider]  Emollient (AQUAPHOR ADVANCED THERAPY) OINT Apply 1 application topically daily. Spread topically to bilateral lower extremities once daily     [provider]  gabapentin (NEURONTIN) 600 MG tablet Take 600 mg by mouth 2 (two) times daily.    [provider]  latanoprost (XALATAN) 0.005 % ophthalmic solution Place 1 drop into both eyes at bedtime.    [provider]  levETIRAcetam (KEPPRA) 750 MG tablet Take 750 mg by mouth 2 (two) times daily.    [provider]  lisinopril (ZESTRIL) 10 MG tablet Take 10 mg by mouth daily.     [provider]  Menthol, Topical Analgesic, 4 % GEL Apply 1 application topically in the morning and at bedtime. Apply to both shoulders    [provider]  mirtazapine (REMERON) 7.5 MG tablet Take 7.5 mg by mouth daily.     [provider]  Multiple Vitamin (TAB-A-VITE) TABS Take 1 tablet by mouth every morning.    [provider]  phenytoin (DILANTIN) 100 MG ER capsule Take 3 capsules (300 mg total) by mouth every evening. Patient taking differently: Take 300 mg by mouth at bedtime.  07/26/16   York Spaniel, MD  polyethylene glycol (MIRALAX / GLYCOLAX) 17 g packet Take 17 g by mouth daily as needed for mild constipation.    [provider]  UNABLE TO FIND "Mighty Shake": Drink 1 shake by mouth three times a day with meals    [provider]  zonisamide (ZONEGRAN) 50 MG capsule Take 3 capsules (150 mg total) by mouth daily. Patient taking differently: Take 100 mg by mouth in the morning and at bedtime.  10/23/16   Joseph Art, DO    Allergies    Carbamazepine and Tricyclic antidepressants  Review of Systems   Review of Systems  All other systems reviewed and are negative.   Physical Exam Updated Vital Signs BP 103/63 (BP Location: Left Arm)   Pulse 73   Temp 98.3 F (36.8 C)   Resp 14   Ht  (1.651 m)   Wt 54.4 kg   SpO2 100%   BMI 19.97 kg/m   Physical Exam Vitals and nursing note reviewed.  Constitutional:      General: He is not in acute distress.    Appearance: He is well-developed.      Comments: Frail elderly male laying in bed appears to be in no acute discomfort.  HENT:     Head:     Comments: Evidence of ecchymosis (racoon's eye) to the right orbit with mild tenderness to palpation but no crepitus or obvious deformity. Eyes:     Comments: Right eye with subconjunctival hemorrhage but no obvious hyphema.  Extraocular movements intact.  Visual is intact.  Neck:     Comments: No cervical midline spine tenderness crepitus or step-off. Cardiovascular:     Rate and Rhythm:  Normal rate and regular rhythm.     Pulses: Normal pulses.     Heart sounds: Normal heart sounds.  Pulmonary:     Effort: Pulmonary effort is normal.     Breath sounds: Normal breath sounds. No wheezing, rhonchi or rales.  Abdominal:     Palpations: Abdomen is soft.     Tenderness: There is no abdominal tenderness.  Genitourinary:    Comments: CNA was available to chaperone.  Patient wearing adult diaper, stool noted in diaper, normal color stool on glove no obvious mass no active bleeding noted.  Intact rectal tone. Musculoskeletal:     Cervical back: Normal range of motion and neck supple.     Comments: Left lower extremity: No tenderness to the left hip knee ankle or foot on palpation.  No deformity noted.  Normal range of motion.  Skin:    Findings: No rash.  Neurological:     Mental Status: He is alert. Mental status is at baseline.     GCS: GCS eye subscore is 4. GCS verbal subscore is 5. GCS motor subscore is 6.     Comments: Right hemiparesis from prior stroke.  Able to move left upper and left lower extremities with normal strength.  Alert and oriented x2.  Psychiatric:        Mood and Affect: Mood normal.     ED Results / Procedures / Treatments   Labs (all labs ordered are listed, but only abnormal results are displayed) Labs Reviewed  BASIC METABOLIC PANEL - Abnormal; Notable for the following components:      Result Value   CO2 20 (*)    Glucose, Bld 103 (*)    Calcium 8.6  (*)    All other components within normal limits  CBC - Abnormal; Notable for the following components:   RBC 3.22 (*)    Hemoglobin 7.5 (*)    HCT 23.2 (*)    MCV 72.0 (*)    MCH 23.3 (*)    RDW 17.3 (*)    All other components within normal limits  PHENYTOIN LEVEL, TOTAL - Abnormal; Notable for the following components:   Phenytoin Lvl 26.3 (*)    All other components within normal limits  CBG MONITORING, ED - Abnormal; Notable for the following components:   Glucose-Capillary 101 (*)    All other components within normal limits  RESPIRATORY PANEL BY RT PCR (FLU A&B, COVID)  URINALYSIS, ROUTINE W REFLEX MICROSCOPIC  POC OCCULT BLOOD, ED  ABO/RH  TYPE AND SCREEN    EKG EKG Interpretation  Date/Time:  Tuesday June 28 2020 12:58:34 EST Ventricular Rate:  70 PR Interval:    QRS Duration: 84 QT Interval:  403 QTC Calculation: 435 R Axis:   82 Text Interpretation: Sinus rhythm Borderline right axis deviation Nonspecific T abnrm, anterolateral leads similar to Mar 2018 Confirmed by Pricilla LovelessGoldston, Scott [phone]([id number]) on 06/28/2020 3:37:10 PM   Radiology CT HEAD WO CONTRAST  Result Date: 06/28/2020 CLINICAL DATA:  Facial trauma. Additional history provided: Patient reports fall yesterday striking right forehead, right eye ecchymosis. EXAM: CT HEAD WITHOUT CONTRAST CT MAXILLOFACIAL WITHOUT CONTRAST TECHNIQUE: Multidetector CT imaging of the head and maxillofacial structures were performed using the standard protocol without intravenous contrast. Multiplanar CT image reconstructions of the maxillofacial structures were also generated. COMPARISON:  Brain MRI 10/21/2016. Head CT 10/21/2016. Maxillofacial CT 08/01/2006. FINDINGS: CT HEAD FINDINGS Brain: Streak and beam hardening artifact arising from a metallic right cranioplasty limits evaluation. Moderate generalized cerebral and cerebellar  atrophy. Mild ill-defined hypoattenuation within the cerebral white matter is nonspecific, but  compatible with chronic small vessel ischemic disease. Within described limitations, there is no evidence of acute intracranial hemorrhage, demarcated cortical infarct or extra-axial fluid collection. No evidence of intracranial mass. No midline shift. Vascular: No hyperdense vessel.  Atherosclerotic calcifications. Skull: Right-sided cranioplasty.  No calvarial fracture. CT MAXILLOFACIAL FINDINGS Osseous: No acute maxillofacial fracture is identified. Orbits: Right periorbital soft tissue swelling. Otherwise, there is no acute finding. The globes are normal in size and contour. The extraocular muscles and optic nerve sheath complexes are symmetric and unremarkable. Sinuses: Trace scattered paranasal sinus mucosal thickening. Soft tissues: Right forehead/periorbital soft tissue swelling. Other: Poor dentition with multiple absent and carious teeth and multifocal periapical lucencies. IMPRESSION: CT head: 1. Examination limited by streak and beam hardening artifact arising from a metallic cranioplasty on the right. 2. Within this limitation, there is no evidence of acute intracranial abnormality. 3. Moderate cerebral and cerebellar atrophy with mild chronic small vessel ischemic disease. CT maxillofacial: 1. No evidence of acute orbital or maxillofacial fracture. 2. Right forehead/periorbital soft tissue swelling. 3. Incidentally noted, there are multiple absent and carious teeth with multifocal periapical lucencies. Electronically Signed   By: Jackey Loge DO   On: 06/28/2020 15:14   CT Maxillofacial Wo Contrast  Result Date: 06/28/2020 CLINICAL DATA:  Facial trauma. Additional history provided: Patient reports fall yesterday striking right forehead, right eye ecchymosis. EXAM: CT HEAD WITHOUT CONTRAST CT MAXILLOFACIAL WITHOUT CONTRAST TECHNIQUE: Multidetector CT imaging of the head and maxillofacial structures were performed using the standard protocol without intravenous contrast. Multiplanar CT image  reconstructions of the maxillofacial structures were also generated. COMPARISON:  Brain MRI 10/21/2016. Head CT 10/21/2016. Maxillofacial CT 08/01/2006. FINDINGS: CT HEAD FINDINGS Brain: Streak and beam hardening artifact arising from a metallic right cranioplasty limits evaluation. Moderate generalized cerebral and cerebellar atrophy. Mild ill-defined hypoattenuation within the cerebral white matter is nonspecific, but compatible with chronic small vessel ischemic disease. Within described limitations, there is no evidence of acute intracranial hemorrhage, demarcated cortical infarct or extra-axial fluid collection. No evidence of intracranial mass. No midline shift. Vascular: No hyperdense vessel.  Atherosclerotic calcifications. Skull: Right-sided cranioplasty.  No calvarial fracture. CT MAXILLOFACIAL FINDINGS Osseous: No acute maxillofacial fracture is identified. Orbits: Right periorbital soft tissue swelling. Otherwise, there is no acute finding. The globes are normal in size and contour. The extraocular muscles and optic nerve sheath complexes are symmetric and unremarkable. Sinuses: Trace scattered paranasal sinus mucosal thickening. Soft tissues: Right forehead/periorbital soft tissue swelling. Other: Poor dentition with multiple absent and carious teeth and multifocal periapical lucencies. IMPRESSION: CT head: 1. Examination limited by streak and beam hardening artifact arising from a metallic cranioplasty on the right. 2. Within this limitation, there is no evidence of acute intracranial abnormality. 3. Moderate cerebral and cerebellar atrophy with mild chronic small vessel ischemic disease. CT maxillofacial: 1. No evidence of acute orbital or maxillofacial fracture. 2. Right forehead/periorbital soft tissue swelling. 3. Incidentally noted, there are multiple absent and carious teeth with multifocal periapical lucencies. Electronically Signed   By: Jackey Loge DO   On: 06/28/2020 15:14     Procedures Procedures (including critical care time)  Medications Ordered in ED Medications  0.9 %  sodium chloride infusion (has no administration in time range)  sodium chloride 0.9 % bolus 1,000 mL (1,000 mLs Intravenous New Bag/Given 06/28/20 1529)    ED Course  I have reviewed the triage vital signs and the nursing notes.  Pertinent labs & imaging results that were available during my care of the patient were reviewed by me and considered in my medical decision making (see chart for details).    MDM Rules/Calculators/A&P                          BP (!) 113/52   Pulse (!) 129   Temp 98.3 F (36.8 C)   Resp 17   Ht 5\' 5"  (1.651 m)   Wt 54.4 kg   SpO2 95%   BMI 19.97 kg/m   Final Clinical Impression(s) / ED Diagnoses Final diagnoses:  Fall at nursing home, initial encounter  Traumatic ecchymosis of orbit, right, initial encounter  Symptomatic anemia    Rx / DC Orders ED Discharge Orders    None     12:21 PM This is an early male who fell off a wheelchair while he was trying to get to bed yesterday.  He lives at [facility]. he has right side deficit with associated weakness, which makes it difficult for him to get up on his own.  It appears to be a mechanical fall without any loss of consciousness.  He does have ecchymosis to his right orbital region with conjunctival hemorrhage involving the right eye.  Initial report of left leg pain however no reproducible leg pain noted on exam.  No midline spine tenderness on exam.  Given his age, work-up initiated including head and maxillofacial CT scan.  3:04 PM Labs remarkable for anemia with hemoglobin of 7.5.  Will obtain Hemoccult.  This may account for his weakness and falling.  His dilantin level is supratherapeutic at 26.3.  4:08 PM Fecal occult blood test is negative.  Patient was having a difficult time with orthostatic vital sign, he was symptomatic, additional IV fluid given.  Will transfuse blood will  consult for admission.  Screening Covid test ordered.  5:00 PM Appreciate consultation from [facility] hospitalist, [facility]. [facility], who agrees to admit patient.  He recommend hold off on blood transfusion at this time.  Screening covid test is negative .   AURON TADROS was evaluated in Emergency Department on 06/28/2020 for the symptoms described in the history of present illness. He was evaluated in the context of the global COVID-19 pandemic, which necessitated consideration that the patient might be at risk for infection with the SARS-CoV-2 virus that causes COVID-19. Institutional protocols and algorithms that pertain to the evaluation of patients at risk for COVID-19 are in a state of rapid change based on information released by regulatory bodies including the CDC and federal and state organizations. These policies and algorithms were followed during the patient's care in the ED.    06/30/2020, PA-C 06/28/20 1701    06/30/20, MD 07/01/20 [phone]

## 2020-06-28 NOTE — ED Notes (Addendum)
Unable to collect blood- Phlebotomy notified.   NS will begin post orthostatic vitals

## 2020-06-28 NOTE — ED Notes (Signed)
During orthostatic vitals, pt unable to sit up alone. Pt placed in sitting position in the bed. Pt stated that he sits in a wheelchair and cannot ambulate. Informed [place] - PA. Unable to do orthostatic vitals.

## 2020-06-28 NOTE — Progress Notes (Signed)
Phenytoin Initial Consult Indication: history of seizures  Allergies  Allergen Reactions  . Carbamazepine Other (See Comments)    As noted on MAR; no specific reaction noted  . Tricyclic Antidepressants Other (See Comments)    As noted on MAR; no specific reaction noted    Patient Measurements: Height: 5\' 5"  (165.1 cm) Weight: 54.4 kg (120 lb) IBW/kg (Calculated) : 61.5 TPN AdjBW (KG): 54.4 Body mass index is 19.97 kg/m.   Vital signs: Temp: 98.3 F (36.8 C) (11/23 1135) BP: 111/57 (11/23 1800) Pulse Rate: 55 (11/23 1800)  Labs: Lab Results  Component Value Date/Time   Phenytoin Lvl 26.3 (H) 06/28/2020 1305   Lab Results  Component Value Date   PHENYTOIN 26.3 (H) 06/28/2020   Estimated Creatinine Clearance: 63.4 mL/min (by C-G formula based on SCr of 0.81 mg/dL).   Assessment: 74 y.o. male admitted for a fall, on phenytoin prior to admission for history of seizures. Phenytoin level found to be supratherapeutic on admission. Patient with no seizure activity, no signs/symptoms of phenytoin toxicity.   Phenytoin level: 26.3 Corrected phenytoin level (if needed): no albumin level available to correct Seizure activity: none Significant potential drug interactions: none  Goals of care:  Total phenytoin level: 10-20 mcg/ml Free phenytoin level: 1-2 mcg/ml  Plan:  Hold phenytoin tonight Phenytoin level, albumin tomorrow morning Pharmacy will continue to follow regarding obtaining total phenytoin levels and dose adjustments as indicated.   61, PharmD PGY1 Pharmacy Resident 06/28/2020 6:15 PM  Please check [email or web address] for unit-specific pharmacy phone numbers.

## 2020-06-28 NOTE — H&P (Signed)
[facility] History and Physical   Patient: [name] ZOX:[id number]   PCP: Herma MeringIbrahim, Sadou, NP DOB: [birth year]   DOA: 06/28/2020   DOS: 06/28/2020   DOS: the patient was seen and examined on 06/28/2020  Patient coming from: The patient is coming from SNF  Chief Complaint: fall  HPI: Peter ReeseDonnie R Zarcone is a 74 y.o. male with Past medical history of past medical history of seizure disorder, hypertension, CVA with right-sided weakness, wheelchair-bound at baseline presents with a fall and eye injury. Patient is from SNF while he was trying to get out of his wheelchair yesterday to get into the bed the floor was slippery and had a fall.  He stayed on the ground for almost 40 minutes before his roommate came in to help him.  He denies having any head injury or neck injury denies having any loss of consciousness.  When RN at the facility went to see him found that he had some bruises on his right side of the face and further he needs to be evaluated by the ER. At the time of my evaluation in the ER patient denies having any complaints of headache, dizziness, chest pain, shortness of breath, fever, chills.  Also denies any nausea or vomiting.  Denies any diarrhea.  Denies any blood in the stool. Denies any change in the medication. Takes aspirin but denies any ibuprofen Aleve naproxen medicines like that. Denies any prior history of upper GI endoscopy as well. Denies any heartburn or acid reflux. Reportedly patient was having soft blood pressure when the EMS found him.   ED Course: Hemoccult was negative.  Found to have hemoglobin of 7.5 with is 2 g drop from his last blood work available in our system although patient tells me that he has been told that he had low hemoglobin.   Review of Systems: as mentioned in the history of present illness.  All other systems reviewed and are negative.  Past Medical History:  Diagnosis Date  . CVA (cerebral infarction)   . Dilantin  toxicity   . 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 History of craniotomy   . Hypertension   . Organic brain syndrome   . Pneumonia   . Polysubstance abuse (HCC)   . Right hemiparesis (HCC)   . SDH (subdural hematoma) (HCC)   . Seizures (HCC)    Past Surgical History:  Procedure Laterality Date  . COLONOSCOPY WITH PROPOFOL N/A 12/15/2014   Procedure: COLONOSCOPY WITH PROPOFOL;  Surgeon: Charlott RakesVincent Schooler, MD;  Location: [facility] ENDOSCOPY;  Service: Endoscopy;  Laterality: N/A;  . CRANIOTOMY     following head trauma   Social History:  reports that he has been smoking. He has been smoking about 1.00 pack per day. He has never used smokeless tobacco. He reports that he does not drink alcohol and does not use drugs.  Allergies  Allergen Reactions  . Carbamazepine Other (See Comments)    As noted on MAR; no specific reaction noted  . Tricyclic Antidepressants Other (See Comments)    As noted on MAR; no specific reaction noted   Family history reviewed and not pertinent Family History  Problem Relation Age of Onset  . Heart attack Mother   . Heart attack Father      Prior to Admission medications   Medication Sig Start Date End Date Taking? Authorizing Provider  acetaminophen (TYLENOL) 325 MG tablet Take 650 mg by mouth every morning. AND MAY TAKE AN ADDITIONAL 650 MG EVERY 8 HOURS AS NEEDED  FOR PAIN   Yes [provider]  aspirin EC 81 MG tablet Take 81 mg by mouth daily.   Yes [provider]  Cholecalciferol (VITAMIN D3) 10 MCG (400 UNIT) tablet Take 800 Units by mouth daily.    Yes [provider]  DULoxetine (CYMBALTA) 30 MG capsule Take 30 mg by mouth at bedtime.    Yes [provider]  Emollient (AQUAPHOR ADVANCED THERAPY) OINT Apply 1 application topically daily. Spread topically to bilateral lower extremities once daily   Yes [provider]  gabapentin (NEURONTIN) 600 MG tablet Take 600 mg by mouth 2 (two) times daily.   Yes [provider]  latanoprost (XALATAN) 0.005 % ophthalmic solution Place 1 drop into both eyes at bedtime.   Yes [provider]  levETIRAcetam (KEPPRA) 750 MG tablet Take 750 mg by mouth 2 (two) times daily.   Yes [provider]  lisinopril (ZESTRIL) 10 MG tablet Take 10 mg by mouth daily.    Yes [provider]  Menthol, Topical Analgesic, 4 % GEL Apply 1 application topically in the morning and at bedtime. Apply to both shoulders   Yes [provider]  mirtazapine (REMERON) 7.5 MG tablet Take 7.5 mg by mouth daily.    Yes [provider]  Multiple Vitamin (TAB-A-VITE) TABS Take 1 tablet by mouth every morning.   Yes [provider]  phenytoin (DILANTIN) 100 MG ER capsule Take 3 capsules (300 mg total) by mouth every evening. Patient taking differently: Take 300 mg by mouth at bedtime.  07/26/16  Yes York Spaniel, MD  polyethylene glycol (MIRALAX / GLYCOLAX) 17 g packet Take 17 g by mouth daily as needed for mild constipation.   Yes [provider]  UNABLE TO FIND "Mighty Shake": Drink 1 shake by mouth three times a day with meals   Yes [provider]  zonisamide (ZONEGRAN) 50 MG capsule Take 3 capsules (150 mg total) by mouth daily. Patient taking differently: Take 100 mg by mouth in the morning and at bedtime.  10/23/16  Yes Joseph Art, DO    Physical Exam: Vitals:   06/28/20 1300 06/28/20 1450 06/28/20 1530 06/28/20 1630  BP: (!) 99/46 (!) 105/94 (!) 113/52 115/63  Pulse: 63 89 70 65  Resp: 11 14 17 11   Temp:      SpO2: 94% 98% 95% 100%  Weight:      Height:        General: alert and oriented to time, place, and person. Appear in mild distress, affect appropriate Eyes: PERRL, Conjunctiva normal ENT: Oral Mucosa Clear, moist  Neck: no JVD, no Abnormal Mass Or lumps Cardiovascular: S1 and S2 Present, no Murmur, peripheral pulses symmetrical Respiratory: good respiratory effort, Bilateral Air entry  equal and Decreased, no of accessory muscle use, Clear to Auscultation, no Crackles, no wheezes Abdomen: Bowel Sound present, Soft and no tenderness, no hernia Skin: no rashes  Extremities: no Pedal edema, no calf tenderness Neurologic: mental status, alert and oriented x3, speech normal and PERLA right sided weakness. Gait not checked due to patient safety concerns  Data Reviewed: I have personally reviewed and interpreted labs, imaging as discussed below.  CBC: Recent Labs  Lab 06/28/20 1305  WBC 8.7  HGB 7.5*  HCT 23.2*  MCV 72.0*  PLT 277   Basic Metabolic Panel: Recent Labs  Lab 06/28/20 1305  NA 139  K 3.6  CL 110  CO2 20*  GLUCOSE 103*  BUN 19  CREATININE 0.81  CALCIUM 8.6*   GFR: Estimated Creatinine Clearance: 63.4 mL/min (by C-G formula based on SCr of 0.81 mg/dL). Liver Function Tests: No results for input(s): AST, ALT, ALKPHOS, BILITOT, PROT, ALBUMIN in the last 168 hours. No results for input(s): LIPASE, AMYLASE in the last 168 hours. No results for input(s): AMMONIA in the last 168 hours. Coagulation Profile: No results for input(s): INR, PROTIME in the last 168 hours. Cardiac Enzymes: No results for input(s): CKTOTAL, CKMB, CKMBINDEX, TROPONINI in the last 168 hours. BNP (last 3 results) No results for input(s): PROBNP in the last 8760 hours. HbA1C: No results for input(s): HGBA1C in the last 72 hours. CBG: Recent Labs  Lab 06/28/20 1313  GLUCAP 101*   Lipid Profile: No results for input(s): CHOL, HDL, LDLCALC, TRIG, CHOLHDL, LDLDIRECT in the last 72 hours. Thyroid Function Tests: No results for input(s): TSH, T4TOTAL, FREET4, T3FREE, THYROIDAB in the last 72 hours. Anemia Panel: No results for input(s): VITAMINB12, FOLATE, FERRITIN, TIBC, IRON, RETICCTPCT in the last 72 hours. Urine analysis:    Component Value Date/Time   COLORURINE YELLOW 02/12/2014 1551   APPEARANCEUR CLOUDY (A) 02/12/2014 1551   LABSPEC 1.023 02/12/2014 1551    PHURINE 6.0 02/12/2014 1551   GLUCOSEU NEGATIVE 02/12/2014 1551   HGBUR NEGATIVE 02/12/2014 1551   BILIRUBINUR NEGATIVE 02/12/2014 1551   KETONESUR NEGATIVE 02/12/2014 1551   PROTEINUR NEGATIVE 02/12/2014 1551   UROBILINOGEN 1.0 02/12/2014 1551   NITRITE NEGATIVE 02/12/2014 1551   LEUKOCYTESUR LARGE (A) 02/12/2014 1551    Radiological Exams on Admission: CT HEAD WO CONTRAST  Result Date: 06/28/2020 CLINICAL DATA:  Facial trauma. Additional history provided: Patient reports fall yesterday striking right forehead, right eye ecchymosis. EXAM: CT HEAD WITHOUT CONTRAST CT MAXILLOFACIAL WITHOUT CONTRAST TECHNIQUE: Multidetector CT imaging of the head and maxillofacial structures were performed using the standard protocol without intravenous contrast. Multiplanar CT image reconstructions of the maxillofacial structures were also generated. COMPARISON:  Brain MRI 10/21/2016. Head CT 10/21/2016. Maxillofacial CT 08/01/2006. FINDINGS: CT HEAD FINDINGS Brain: Streak and beam hardening artifact arising from a metallic right cranioplasty limits evaluation. Moderate generalized cerebral and cerebellar atrophy. Mild ill-defined hypoattenuation within the cerebral white matter is nonspecific, but compatible with chronic small vessel ischemic disease. Within described limitations, there is no evidence of acute intracranial hemorrhage, demarcated cortical infarct or extra-axial fluid collection. No evidence of intracranial mass. No midline shift. Vascular: No hyperdense vessel.  Atherosclerotic calcifications. Skull: Right-sided cranioplasty.  No calvarial fracture. CT MAXILLOFACIAL FINDINGS Osseous: No acute maxillofacial fracture is identified. Orbits: Right periorbital soft tissue swelling. Otherwise, there is no acute finding. The globes are normal in size and contour. The extraocular muscles and optic nerve sheath complexes are symmetric and unremarkable. Sinuses: Trace scattered paranasal sinus mucosal  thickening. Soft tissues: Right forehead/periorbital soft tissue swelling. Other: Poor dentition with multiple absent and carious teeth and multifocal periapical lucencies. IMPRESSION: CT head: 1. Examination limited by streak and beam hardening artifact arising from a metallic cranioplasty on the right. 2. Within this limitation, there is no evidence of acute intracranial abnormality. 3. Moderate cerebral and cerebellar atrophy with mild chronic small vessel ischemic disease. CT maxillofacial: 1. No evidence of acute orbital or maxillofacial fracture. 2. Right forehead/periorbital soft tissue swelling. 3. Incidentally noted, there are multiple absent and carious teeth with multifocal periapical lucencies. Electronically Signed   By: Jackey Loge DO   On: 06/28/2020 15:14   CT Maxillofacial Wo Contrast  Result Date: 06/28/2020 CLINICAL DATA:  Facial trauma.  Additional history provided: Patient reports fall yesterday striking right forehead, right eye ecchymosis. EXAM: CT HEAD WITHOUT CONTRAST CT MAXILLOFACIAL WITHOUT CONTRAST TECHNIQUE: Multidetector CT imaging of the head and maxillofacial structures were performed using the standard protocol without intravenous contrast. Multiplanar CT image reconstructions of the maxillofacial structures were also generated. COMPARISON:  Brain MRI 10/21/2016. Head CT 10/21/2016. Maxillofacial CT 08/01/2006. FINDINGS: CT HEAD FINDINGS Brain: Streak and beam hardening artifact arising from a metallic right cranioplasty limits evaluation. Moderate generalized cerebral and cerebellar atrophy. Mild ill-defined hypoattenuation within the cerebral white matter is nonspecific, but compatible with chronic small vessel ischemic disease. Within described limitations, there is no evidence of acute intracranial hemorrhage, demarcated cortical infarct or extra-axial fluid collection. No evidence of intracranial mass. No midline shift. Vascular: No hyperdense vessel.  Atherosclerotic  calcifications. Skull: Right-sided cranioplasty.  No calvarial fracture. CT MAXILLOFACIAL FINDINGS Osseous: No acute maxillofacial fracture is identified. Orbits: Right periorbital soft tissue swelling. Otherwise, there is no acute finding. The globes are normal in size and contour. The extraocular muscles and optic nerve sheath complexes are symmetric and unremarkable. Sinuses: Trace scattered paranasal sinus mucosal thickening. Soft tissues: Right forehead/periorbital soft tissue swelling. Other: Poor dentition with multiple absent and carious teeth and multifocal periapical lucencies. IMPRESSION: CT head: 1. Examination limited by streak and beam hardening artifact arising from a metallic cranioplasty on the right. 2. Within this limitation, there is no evidence of acute intracranial abnormality. 3. Moderate cerebral and cerebellar atrophy with mild chronic small vessel ischemic disease. CT maxillofacial: 1. No evidence of acute orbital or maxillofacial fracture. 2. Right forehead/periorbital soft tissue swelling. 3. Incidentally noted, there are multiple absent and carious teeth with multifocal periapical lucencies. Electronically Signed   By: Jackey Loge DO   On: 06/28/2020 15:14   EKG: Independently reviewed. normal sinus rhythm, nonspecific ST and T waves changes.  I reviewed all nursing notes, pharmacy notes, vitals, pertinent old records.  Assessment/Plan 1. Microcytic anemia Patient presents with generalized weakness and fatigue and a fall. Hemoglobin 7.5. Prior number is a 9.5. Patient Hemoccult negative. No active bleeding over by the patient. Will monitor overnight and transfused hemoglobin less than seven. Initial plan from the EDP was to transfuse one PRBC but currently holding off on that. We will keep the patient on clear liquid diet for now. If the hemoglobin drops he would require to be n.p.o. GI will need to be consulted. I will also check iron, B12, folic acid. Initiate iron  supplementation.  2.  Seizure disorder. On phenytoin and Keppra. Dilantin level mildly elevated.  We will consult pharmacy for further assistance. Continue Keppra.  Zonisamide. No seizures reported by the patient. I do not think further seizure work-up is necessary for now.  3.  Unwitnessed fall. Etiology of the fall is not clear but per patient's history it appears to be a mechanical fall on a slippery follow-up. CT head and CT maxilla spine is negative for any acute abnormality. No further work-up necessary.  4.  Chronic pain syndrome. Mood disorder Patient is on Cymbalta as well as gabapentin which I will continue. Continue Remeron.  5.  Suspected protein calorie malnutrition. We will consult dietary for further assistance.  Continue Remeron.  6.  Essential hypertension. Hypotension on admission. On lisinopril at home. Currently blood pressure was soft. I will discontinue all antihypertensive medications.  Nutrition: Cardiac diet DVT Prophylaxis: SCD, pharmacological prophylaxis contraindicated due to Concern for bleeding  Advance goals of care discussion: Full code patient's sister Ms.  Kerman Passey will be his POA   Consults: none   Family Communication: no family was present at bedside, at the time of interview.   Disposition:  From: SNF Likely will need SNF on discharge.   Author: Lynden Oxford, MD [facility] 06/28/2020 6:03 PM   To reach On-call, see care teams to locate the attending and reach out to them via [email or web address]. If 7PM-7AM, please contact night-coverage If you still have difficulty reaching the attending provider, please page the [facility] (Director on Call) for [facility] on amion for assistance.

## 2020-06-28 NOTE — ED Triage Notes (Signed)
Patient presents to the ED by EMS with c/o fall yesterday. Right eye swelling and right leg pain. Hx of stroke right side deficiet. No blood thinners. Initial pressure 90/60 gave 200 cc NS now 109/82. Ao/3 cbg 103 Temp 98 HR 82

## 2020-06-29 ENCOUNTER — Other Ambulatory Visit: Payer: Self-pay

## 2020-06-29 DIAGNOSIS — Z20822 Contact with and (suspected) exposure to covid-19: Secondary | ICD-10-CM | POA: Diagnosis present

## 2020-06-29 DIAGNOSIS — I10 Essential (primary) hypertension: Secondary | ICD-10-CM | POA: Diagnosis present

## 2020-06-29 DIAGNOSIS — Z681 Body mass index (BMI) 19 or less, adult: Secondary | ICD-10-CM | POA: Diagnosis not present

## 2020-06-29 DIAGNOSIS — Z8249 Family history of ischemic heart disease and other diseases of the circulatory system: Secondary | ICD-10-CM | POA: Diagnosis not present

## 2020-06-29 DIAGNOSIS — E43 Unspecified severe protein-calorie malnutrition: Secondary | ICD-10-CM | POA: Diagnosis present

## 2020-06-29 DIAGNOSIS — Z79899 Other long term (current) drug therapy: Secondary | ICD-10-CM | POA: Diagnosis not present

## 2020-06-29 DIAGNOSIS — N3090 Cystitis, unspecified without hematuria: Secondary | ICD-10-CM | POA: Diagnosis present

## 2020-06-29 DIAGNOSIS — R64 Cachexia: Secondary | ICD-10-CM | POA: Diagnosis present

## 2020-06-29 DIAGNOSIS — F419 Anxiety disorder, unspecified: Secondary | ICD-10-CM | POA: Diagnosis present

## 2020-06-29 DIAGNOSIS — I959 Hypotension, unspecified: Secondary | ICD-10-CM | POA: Diagnosis present

## 2020-06-29 DIAGNOSIS — Z993 Dependence on wheelchair: Secondary | ICD-10-CM | POA: Diagnosis not present

## 2020-06-29 DIAGNOSIS — G40909 Epilepsy, unspecified, not intractable, without status epilepticus: Secondary | ICD-10-CM | POA: Diagnosis present

## 2020-06-29 DIAGNOSIS — Z7982 Long term (current) use of aspirin: Secondary | ICD-10-CM | POA: Diagnosis not present

## 2020-06-29 DIAGNOSIS — E876 Hypokalemia: Secondary | ICD-10-CM | POA: Diagnosis present

## 2020-06-29 DIAGNOSIS — I69351 Hemiplegia and hemiparesis following cerebral infarction affecting right dominant side: Secondary | ICD-10-CM | POA: Diagnosis not present

## 2020-06-29 DIAGNOSIS — K5909 Other constipation: Secondary | ICD-10-CM | POA: Diagnosis present

## 2020-06-29 DIAGNOSIS — Z888 Allergy status to other drugs, medicaments and biological substances status: Secondary | ICD-10-CM | POA: Diagnosis not present

## 2020-06-29 DIAGNOSIS — R7989 Other specified abnormal findings of blood chemistry: Secondary | ICD-10-CM | POA: Diagnosis present

## 2020-06-29 DIAGNOSIS — W010XXA Fall on same level from slipping, tripping and stumbling without subsequent striking against object, initial encounter: Secondary | ICD-10-CM | POA: Diagnosis present

## 2020-06-29 DIAGNOSIS — G894 Chronic pain syndrome: Secondary | ICD-10-CM | POA: Diagnosis present

## 2020-06-29 DIAGNOSIS — F32A Depression, unspecified: Secondary | ICD-10-CM | POA: Diagnosis present

## 2020-06-29 DIAGNOSIS — Y92129 Unspecified place in nursing home as the place of occurrence of the external cause: Secondary | ICD-10-CM | POA: Diagnosis not present

## 2020-06-29 DIAGNOSIS — R55 Syncope and collapse: Secondary | ICD-10-CM | POA: Diagnosis present

## 2020-06-29 DIAGNOSIS — D509 Iron deficiency anemia, unspecified: Secondary | ICD-10-CM | POA: Diagnosis not present

## 2020-06-29 DIAGNOSIS — Z23 Encounter for immunization: Secondary | ICD-10-CM | POA: Diagnosis present

## 2020-06-29 DIAGNOSIS — F17210 Nicotine dependence, cigarettes, uncomplicated: Secondary | ICD-10-CM | POA: Diagnosis present

## 2020-06-29 LAB — COMPREHENSIVE METABOLIC PANEL
ALT: 21 U/L (ref 0–44)
AST: 24 U/L (ref 15–41)
Albumin: 2.7 g/dL — ABNORMAL LOW (ref 3.5–5.0)
Alkaline Phosphatase: 124 U/L (ref 38–126)
Anion gap: 9 (ref 5–15)
BUN: 17 mg/dL (ref 8–23)
CO2: 22 mmol/L (ref 22–32)
Calcium: 8.5 mg/dL — ABNORMAL LOW (ref 8.9–10.3)
Chloride: 107 mmol/L (ref 98–111)
Creatinine, Ser: 0.7 mg/dL (ref 0.61–1.24)
GFR, Estimated: >60 mL/min (ref >60)
Glucose, Bld: 93 mg/dL (ref 70–99)
Potassium: 3.4 mmol/L — ABNORMAL LOW (ref 3.5–5.1)
Sodium: 138 mmol/L (ref 135–145)
Total Bilirubin: 0.4 mg/dL (ref 0.3–1.2)
Total Protein: 6.5 g/dL (ref 6.5–8.1)

## 2020-06-29 LAB — CBC
HCT: 22.1 % — ABNORMAL LOW (ref 39.0–52.0)
Hemoglobin: 7.4 g/dL — ABNORMAL LOW (ref 13.0–17.0)
MCH: 23.4 pg — ABNORMAL LOW (ref 26.0–34.0)
MCHC: 33.5 g/dL (ref 30.0–36.0)
MCV: 69.9 fL — ABNORMAL LOW (ref 80.0–100.0)
Platelets: 232 10*3/uL (ref 150–400)
RBC: 3.16 MIL/uL — ABNORMAL LOW (ref 4.22–5.81)
RDW: 16.6 % — ABNORMAL HIGH (ref 11.5–15.5)
WBC: 6.4 10*3/uL (ref 4.0–10.5)
nRBC: 0 % (ref 0.0–0.2)

## 2020-06-29 LAB — PHENYTOIN LEVEL, TOTAL: Phenytoin Lvl: 22.7 ug/mL — ABNORMAL HIGH (ref 10.0–20.0)

## 2020-06-29 MED ORDER — INFLUENZA VAC A&B SA ADJ QUAD 0.5 ML IM PRSY
0.5000 mL | PREFILLED_SYRINGE | INTRAMUSCULAR | Status: AC
  Administered 2020-07-01: 0.5 mL via INTRAMUSCULAR
  Filled 2020-06-29: qty 0.5

## 2020-06-29 MED ORDER — PNEUMOCOCCAL VAC POLYVALENT 25 MCG/0.5ML IJ INJ
0.5000 mL | INJECTION | INTRAMUSCULAR | Status: AC
  Administered 2020-07-01: 0.5 mL via INTRAMUSCULAR

## 2020-06-29 MED ORDER — POTASSIUM CHLORIDE CRYS ER 20 MEQ PO TBCR
20.0000 meq | EXTENDED_RELEASE_TABLET | Freq: Once | ORAL | Status: AC
  Administered 2020-06-29: 20 meq via ORAL

## 2020-06-29 MED ORDER — ADULT MULTIVITAMIN W/MINERALS CH
1.0000 | ORAL_TABLET | Freq: Every day | ORAL | Status: DC
  Administered 2020-06-29 – 2020-07-01 (×3): 1 via ORAL
  Filled 2020-06-29 (×3): qty 1

## 2020-06-29 NOTE — Plan of Care (Signed)
  Problem: Coping: Goal: Level of anxiety will decrease Outcome: Progressing   Problem: Elimination: Goal: Will not experience complications related to bowel motility Outcome: Progressing Goal: Will not experience complications related to urinary retention Outcome: Progressing   Problem: Pain Managment: Goal: General experience of comfort will improve Outcome: Progressing   

## 2020-06-29 NOTE — Progress Notes (Addendum)
Initial Nutrition Assessment  DOCUMENTATION CODES:   Underweight  INTERVENTION:    Continue Boost Breeze po TID, each supplement provides 250 kcal and 9 grams of protein. When diet advanced, can change to Ensure Enlive for more protein & calories.  Add MVI with minerals daily.  NUTRITION DIAGNOSIS:   Inadequate oral intake related to acute illness as evidenced by other (comment) (clear liquid diet).  GOAL:   Patient will meet greater than or equal to 90% of their needs  MONITOR:   Diet advancement, Supplement acceptance, PO intake, Skin  REASON FOR ASSESSMENT:   Consult Assessment of nutrition requirement/status  ASSESSMENT:   75 yo male admitted from SNF S/P fall with eye injury. PMH includes seizure disorder, HTN CVA with R sided weakness, wheelchair bound, organic brain syndrome.   Patient is currently on a clear liquid diet. 0% meal intake recorded for breakfast today. Boost Breeze supplement has been ordered TID. Patient was given one supplement last night and one this morning. Unsure if he drank them.   Labs reviewed. K 3.4 Medications reviewed and include ferrous sulfate, Keppra, Remeron.  Usual weights reviewed. The most recent weight available PTA was 49.4 kg >3 years ago.  Suspect patient is malnourished. Unable to obtain enough information at this time for identification of malnutrition. He is underweight with BMI=15.1.   Diet Order:   Diet Order            Diet clear liquid Room service appropriate? No; Fluid consistency: Thin  Diet effective now                 EDUCATION NEEDS:   Not appropriate for education at this time  Skin:  Skin Assessment: Skin Integrity Issues: Skin Integrity Issues:: Stage II Stage II: coccyx and L foot  Last BM:  no BM documented  Height:   Ht Readings from Last 1 Encounters:  06/28/20 5\' 5"  (1.651 m)    Weight:   Wt Readings from Last 1 Encounters:  06/29/20 41.2 kg    Ideal Body Weight:  61.8  kg  BMI:  Body mass index is 15.11 kg/m.  Estimated Nutritional Needs:   Kcal:  1500-1700  Protein:  70-80 gm  Fluid:  >/= 1.5 L    07/01/20, RD, LDN, CNSC Please refer to Amion for contact information.

## 2020-06-29 NOTE — Progress Notes (Signed)
Phenytoin Initial Consult Indication: history of seizures  Allergies  Allergen Reactions  . Carbamazepine Other (See Comments)    As noted on MAR; no specific reaction noted  . Tricyclic Antidepressants Other (See Comments)    As noted on MAR; no specific reaction noted    Patient Measurements: Height: 5\' 5"  (165.1 cm) Weight: 41.2 kg (90 lb 13.3 oz) IBW/kg (Calculated) : 61.5 TPN AdjBW (KG): 54.4 Body mass index is 15.11 kg/m.   Vital signs: Temp: 97.2 F (36.2 C) (11/24 0700) Temp Source: Oral (11/24 0700) BP: 106/59 (11/24 0700) Pulse Rate: 72 (11/24 0700)  Labs: Lab Results  Component Value Date/Time   Albumin 2.7 (L) 06/29/2020 0209   Phenytoin Lvl 22.7 (H) 06/29/2020 0209   Phenytoin Lvl 26.3 (H) 06/28/2020 1305   Lab Results  Component Value Date   PHENYTOIN 22.7 (H) 06/29/2020   Estimated Creatinine Clearance: 48.6 mL/min (by C-G formula based on SCr of 0.7 mg/dL).   Assessment: 74 y.o. male admitted for a fall, on phenytoin prior to admission for history of seizures. Phenytoin level found to be supratherapeutic on admission. Patient with no seizure activity, no signs/symptoms of phenytoin toxicity.   11/23 PHT= 26.3; corrected (using albumin of 2.7)= 41 11/24 PHT= 22.7 (alb= 2.7); corrected= 35  Goals of care:  Total phenytoin level: 10-20 mcg/ml Free phenytoin level: 1-2 mcg/ml  Plan:  Hold phenytoin tonight Phenytoin level, albumin on 11/27 Restart phenytoin when corrected level is closer to 20  12/27, PharmD Clinical Pharmacist **Pharmacist phone directory can now be found on [email or web address] ([email or web address]).  Listed under [facility].   [facility]

## 2020-06-29 NOTE — Progress Notes (Addendum)
PROGRESS NOTE  [name] [id number] DOB: 10-19-1945 DOA: 06/28/2020 PCP: Herma MeringIbrahim, Sadou, NP  HPI/Recap of past 24 hours: [name] is a 74 y.o. male with Past medical history of past medical history of seizure disorder, hypertension, CVA with right-sided weakness, wheelchair-bound at baseline presents with a fall and eye injury. Patient is from SNF while he was trying to get out of his wheelchair yesterday to get into the bed the floor was slippery and had a fall.  He stayed on the ground for almost 40 minutes before his roommate came in to help him.  He denies having any head injury or neck injury denies having any loss of consciousness.  When RN at the facility went to see him found that he had some bruises on his right side of the face and further he needs to be evaluated by the ER. At the time of my evaluation in the ER patient denies having any complaints of headache, dizziness, chest pain, shortness of breath, fever, chills.  Also denies any nausea or vomiting.  Denies any diarrhea.  Denies any blood in the stool. Denies any change in the medication. Takes aspirin but denies any ibuprofen Aleve naproxen medicines like that. Denies any prior history of upper GI endoscopy as well. Denies any heartburn or acid reflux. Reportedly patient was having soft blood pressure when the EMS found him.   06/29/20:  Seen and examined at his bedside.  Denies epigastric pain or nausea.  ~Hemoglobin this morning 7.4 from 8.1 with iron studies consistent with iron deficiency, started on ferrous sulfate.  He had a negative Hemoccult.  Will consult GI for further evaluation of his anemia.  Assessment/Plan: Principal Problem:   Microcytic anemia Active Problems:   Seizure disorder (HCC)   Hemiparesis affecting dominant side as late effect of stroke (HCC)   Gait disorder   Essential hypertension   Syncope   Pressure injury of skin  1.   Chronic microcytic anemia/iron deficiency  anemia Patient presents with generalized weakness and fatigue and a fall. Hemoglobin 7.5. Baseline hemoglobin appears to be 9.5 Negative FOBT Iron studies consistent with iron deficiency Started on ferrous sulfate GI consult  2.  Seizure disorder. On phenytoin and Keppra. Phenytoin level is elevated, hold Continue Keppra Continue seizure precautions  3.  Unwitnessed fall. Etiology of the fall is not clear but per patient's history it appears to be a mechanical fall on a slippery follow-up. CT head and CT maxilla spine is negative for any acute abnormality. No further work-up necessary.  4.  Chronic pain syndrome/mood disorder/chronic anxiety/depression. Patient is on Cymbalta as well as gabapentin which I will continue. Continue Remeron.  5.    Severe protein calorie malnutrition  Encourage oral intake Continue oral supplement  6.  Essential hypertension. Hypotension on admission. BPs have been soft Continue to hold off home oral antihypertensives Continue to monitor vital signs  7.  Generalized weakness likely multifactorial secondary to poor oral intake and symptomatic anemia Management as per above Fall precautions  8.   Hypokalemia Potassium 3.4, repleted orally Replete as indicated Repeat BMP in the morning    Nutrition: Cardiac diet DVT Prophylaxis: SCD, pharmacological prophylaxis contraindicated due to Concern for bleeding  Advance goals of care discussion: Full code patient's sister Ms. Kerman PasseyWanda Cole will be his POA   Consults: none   Family Communication: no family was present at bedside, at the time of interview.   Disposition:  From: SNF Likely will need SNF on  discharge.     Code Status: Full code   Consultants:  GI  Procedures:  None  Antimicrobials:  None   Status is: Observation    Dispo: The patient is from: SNF.              Anticipated d/c is to: SNF              Anticipated d/c date is: 07/01/2020 or when GI  signs off.               Patient currently not stable at this time due to ongoing management of anemia.       Objective: Vitals:   06/29/20 0134 06/29/20 0528 06/29/20 0700 06/29/20 1125  BP: (!) 109/47 (!) 109/50 (!) 106/59 (!) 89/62  Pulse: 71 69 72 86  Resp: 12 10 13 16   Temp: 97.7 F (36.5 C) 98.8 F (37.1 C) (!) 97.2 F (36.2 C) 98 F (36.7 C)  TempSrc: Oral Oral Oral Oral  SpO2: 99% 100% 98%   Weight:  41.2 kg    Height:        Intake/Output Summary (Last 24 hours) at 06/29/2020 1610 Last data filed at 06/29/2020 1000 Gross per 24 hour  Intake 480 ml  Output --  Net 480 ml   Filed Weights   06/28/20 1138 06/29/20 0528  Weight: 54.4 kg 41.2 kg    Exam:  . General: 74 y.o. year-old male Emaciated alert and interactive.  . Cardiovascular: Regular rate and rhythm with no rubs or gallops.  No thyromegaly or JVD noted.   61 Respiratory: Clear to auscultation with no wheezes or rales. Good inspiratory effort. . Abdomen: Soft nontender nondistended with normal bowel sounds x4 quadrants. . Musculoskeletal: No lower extremity edema. 2/4 pulses in all 4 extremities. Marland Kitchen Psychiatry: Mood is appropriate for condition and setting   Data Reviewed: CBC: Recent Labs  Lab 06/28/20 1305 06/28/20 1905 06/29/20 0209  WBC 8.7 8.6 6.4  HGB 7.5* 8.1* 7.4*  HCT 23.2* 24.8* 22.1*  MCV 72.0* 71.3* 69.9*  PLT 277 274 232   Basic Metabolic Panel: Recent Labs  Lab 06/28/20 1305 06/29/20 0209  NA 139 138  K 3.6 3.4*  CL 110 107  CO2 20* 22  GLUCOSE 103* 93  BUN 19 17  CREATININE 0.81 0.70  CALCIUM 8.6* 8.5*   GFR: Estimated Creatinine Clearance: 48.6 mL/min (by C-G formula based on SCr of 0.7 mg/dL). Liver Function Tests: Recent Labs  Lab 06/29/20 0209  AST 24  ALT 21  ALKPHOS 124  BILITOT 0.4  PROT 6.5  ALBUMIN 2.7*   No results for input(s): LIPASE, AMYLASE in the last 168 hours. No results for input(s): AMMONIA in the last 168 hours. Coagulation  Profile: Recent Labs  Lab 06/28/20 1905  INR 1.2   Cardiac Enzymes: No results for input(s): CKTOTAL, CKMB, CKMBINDEX, TROPONINI in the last 168 hours. BNP (last 3 results) No results for input(s): PROBNP in the last 8760 hours. HbA1C: No results for input(s): HGBA1C in the last 72 hours. CBG: Recent Labs  Lab 06/28/20 1313  GLUCAP 101*   Lipid Profile: No results for input(s): CHOL, HDL, LDLCALC, TRIG, CHOLHDL, LDLDIRECT in the last 72 hours. Thyroid Function Tests: No results for input(s): TSH, T4TOTAL, FREET4, T3FREE, THYROIDAB in the last 72 hours. Anemia Panel: Recent Labs    06/28/20 1905  VITAMINB12 348  FOLATE 29.5  FERRITIN 1,483*  TIBC 161*  IRON 26*   Urine analysis:  Component Value Date/Time   COLORURINE YELLOW 02/12/2014 1551   APPEARANCEUR CLOUDY (A) 02/12/2014 1551   LABSPEC 1.023 02/12/2014 1551   PHURINE 6.0 02/12/2014 1551   GLUCOSEU NEGATIVE 02/12/2014 1551   HGBUR NEGATIVE 02/12/2014 1551   BILIRUBINUR NEGATIVE 02/12/2014 1551   KETONESUR NEGATIVE 02/12/2014 1551   PROTEINUR NEGATIVE 02/12/2014 1551   UROBILINOGEN 1.0 02/12/2014 1551   NITRITE NEGATIVE 02/12/2014 1551   LEUKOCYTESUR LARGE (A) 02/12/2014 1551   Sepsis Labs: @LABRCNTIP (procalcitonin:4,lacticidven:4)  ) Recent Results (from the past 240 hour(s))  Respiratory Panel by RT PCR (Flu A&B, Covid) - Nasopharyngeal Swab     Status: None   Collection Time: 06/28/20  3:10 PM   Specimen: Nasopharyngeal Swab; Nasopharyngeal(NP) swabs in vial transport medium  Result Value Ref Range Status   SARS Coronavirus 2 by RT PCR NEGATIVE NEGATIVE Final    Comment: (NOTE) SARS-CoV-2 target nucleic acids are NOT DETECTED.  The SARS-CoV-2 RNA is generally detectable in upper respiratoy specimens during the acute phase of infection. The lowest concentration of SARS-CoV-2 viral copies this assay can detect is 131 copies/mL. A negative result does not preclude SARS-Cov-2 infection and should  not be used as the sole basis for treatment or other patient management decisions. A negative result may occur with  improper specimen collection/handling, submission of specimen other than nasopharyngeal swab, presence of viral mutation(s) within the areas targeted by this assay, and inadequate number of viral copies (<131 copies/mL). A negative result must be combined with clinical observations, patient history, and epidemiological information. The expected result is Negative.  Fact Sheet for Patients:  06/30/20  Fact Sheet for Healthcare Providers:  https://www.moore.com/  This test is no t yet approved or cleared by the https://www.young.biz/ FDA and  has been authorized for detection and/or diagnosis of SARS-CoV-2 by FDA under an Emergency Use Authorization (EUA). This EUA will remain  in effect (meaning this test can be used) for the duration of the COVID-19 declaration under Section 564(b)(1) of the Act, 21 U.S.C. section 360bbb-3(b)(1), unless the authorization is terminated or revoked sooner.     Influenza A by PCR NEGATIVE NEGATIVE Final   Influenza B by PCR NEGATIVE NEGATIVE Final    Comment: (NOTE) The Xpert Xpress SARS-CoV-2/FLU/RSV assay is intended as an aid in  the diagnosis of influenza from Nasopharyngeal swab specimens and  should not be used as a sole basis for treatment. Nasal washings and  aspirates are unacceptable for Xpert Xpress SARS-CoV-2/FLU/RSV  testing.  Fact Sheet for Patients: [place]  Fact Sheet for Healthcare Providers: https://www.moore.com/  This test is not yet approved or cleared by the https://www.young.biz/ FDA and  has been authorized for detection and/or diagnosis of SARS-CoV-2 by  FDA under an Emergency Use Authorization (EUA). This EUA will remain  in effect (meaning this test can be used) for the duration of the  Covid-19 declaration under  Section 564(b)(1) of the Act, 21  U.S.C. section 360bbb-3(b)(1), unless the authorization is  terminated or revoked. Performed at [facility], [address] [place], [place]       Studies: No results found.  Scheduled Meds: . DULoxetine  30 mg Oral QHS  . feeding supplement  1 Container Oral TID BM  . ferrous sulfate  325 mg Oral TID WC  . gabapentin  600 mg Oral BID  . [START ON 06/30/2020] influenza vaccine adjuvanted  0.5 mL Intramuscular Tomorrow-1000  . latanoprost  1 drop Both Eyes QHS  . levETIRAcetam  750 mg Oral BID  . mirtazapine  7.5 mg Oral Daily  . multivitamin with minerals  1 tablet Oral Daily  . [START ON 06/30/2020] pneumococcal 23 valent vaccine  0.5 mL Intramuscular Tomorrow-1000  . sodium chloride flush  3 mL Intravenous Q12H  . zonisamide  150 mg Oral Daily    Continuous Infusions: . sodium chloride       LOS: 0 days     Darlin Drop, MD [facility] Pager [phone]  If 7PM-7AM, please contact night-coverage [email or web address] Password [email or web address] 06/29/2020, 4:10 PM

## 2020-06-29 NOTE — Plan of Care (Signed)

## 2020-06-30 ENCOUNTER — Inpatient Hospital Stay (HOSPITAL_COMMUNITY): Payer: Medicare Other

## 2020-06-30 DIAGNOSIS — D509 Iron deficiency anemia, unspecified: Secondary | ICD-10-CM | POA: Diagnosis not present

## 2020-06-30 LAB — CBC
HCT: 23.4 % — ABNORMAL LOW (ref 39.0–52.0)
Hemoglobin: 7.8 g/dL — ABNORMAL LOW (ref 13.0–17.0)
MCH: 23.4 pg — ABNORMAL LOW (ref 26.0–34.0)
MCHC: 33.3 g/dL (ref 30.0–36.0)
MCV: 70.1 fL — ABNORMAL LOW (ref 80.0–100.0)
Platelets: 324 10*3/uL (ref 150–400)
RBC: 3.34 MIL/uL — ABNORMAL LOW (ref 4.22–5.81)
RDW: 16.4 % — ABNORMAL HIGH (ref 11.5–15.5)
WBC: 5.5 10*3/uL (ref 4.0–10.5)
nRBC: 0 % (ref 0.0–0.2)

## 2020-06-30 LAB — BASIC METABOLIC PANEL
Anion gap: 8 (ref 5–15)
BUN: 11 mg/dL (ref 8–23)
CO2: 21 mmol/L — ABNORMAL LOW (ref 22–32)
Calcium: 8.4 mg/dL — ABNORMAL LOW (ref 8.9–10.3)
Chloride: 107 mmol/L (ref 98–111)
Creatinine, Ser: 0.69 mg/dL (ref 0.61–1.24)
GFR, Estimated: >60 mL/min (ref >60)
Glucose, Bld: 94 mg/dL (ref 70–99)
Potassium: 3.7 mmol/L (ref 3.5–5.1)
Sodium: 136 mmol/L (ref 135–145)

## 2020-06-30 MED ORDER — ENOXAPARIN SODIUM 30 MG/0.3ML ~~LOC~~ SOLN
30.0000 mg | SUBCUTANEOUS | Status: DC
  Administered 2020-06-30 – 2020-07-01 (×2): 30 mg via SUBCUTANEOUS
  Filled 2020-06-30 (×2): qty 0.3

## 2020-06-30 MED ORDER — IOHEXOL 300 MG/ML  SOLN
100.0000 mL | Freq: Once | INTRAMUSCULAR | Status: AC | PRN
  Administered 2020-06-30: 100 mL via INTRAVENOUS

## 2020-06-30 MED ORDER — SENNOSIDES-DOCUSATE SODIUM 8.6-50 MG PO TABS
2.0000 | ORAL_TABLET | Freq: Two times a day (BID) | ORAL | Status: DC
  Administered 2020-06-30 – 2020-07-01 (×2): 2 via ORAL
  Filled 2020-06-30 (×3): qty 2

## 2020-06-30 NOTE — Progress Notes (Addendum)
[name] Kitchen   PROGRESS NOTE  [name] TGG:[id number] DOB: 1945/09/19 DOA: 06/28/2020 PCP: Herma Mering, NP  HPI/Recap of past 24 hours: [name] is a 74 y.o. male with Past medical history of past medical history of seizure disorder, hypertension, CVA with right-sided weakness, wheelchair-bound at baseline presents with a fall and eye injury. Patient is from SNF while he was trying to get out of his wheelchair yesterday to get into the bed the floor was slippery and had a fall.  He stayed on the ground for almost 40 minutes before his roommate came in to help him.  He denies having any head injury or neck injury denies having any loss of consciousness.  When RN at the facility went to see him found that he had some bruises on his right side of the face and further he needs to be evaluated by the ER. At the time of my evaluation in the ER patient denies having any complaints of headache, dizziness, chest pain, shortness of breath, fever, chills.  Also denies any nausea or vomiting.  Denies any diarrhea.  Denies any blood in the stool. Denies any change in the medication. Takes aspirin but denies any ibuprofen Aleve naproxen medicines like that. Denies any prior history of upper GI endoscopy as well. Denies any heartburn or acid reflux. Reportedly patient was having soft blood pressure when the EMS found him.   06/30/20: Seen and examined.  Reports history of constipation.  Scheduled Senokot added.  Seen by GI.  Will obtain CT abdomen and pelvis with IV and oral contrast to rule out any underlying malignancy.  Recommended to continue with clear liquid diet for now.  Plan for inpatient EGD and colonoscopy if any obvious abnormality is seen on the CT scan or ongoing drop in hemoglobin.  Assessment/Plan: Principal Problem:   Microcytic anemia Active Problems:   Seizure disorder (HCC)   Hemiparesis affecting dominant side as late effect of stroke (HCC)   Gait disorder   Essential  hypertension   Syncope   Pressure injury of skin  Chronic microcytic anemia/iron deficiency anemia Patient presents with generalized weakness and fatigue and a fall. Hemoglobin 7.5> 7.8. Baseline hemoglobin appears to be 9.5 Negative FOBT Iron studies consistent with iron deficiency Started on ferrous sulfate, continue Seen by GI.  Will obtain CT abdomen and pelvis with IV and oral contrast to rule out any underlying malignancy.  Recommended to continue with clear liquid diet for now.  Plan for inpatient EGD and colonoscopy if any obvious abnormality is seen on the CT scan or ongoing drop in hemoglobin.  Seizure disorder. On phenytoin and Keppra. Phenytoin level is elevated 22.7, hold Continue Keppra Continue seizure precautions  Unwitnessed fall. Etiology of the fall is not clear but per patient's history it appears to be a mechanical fall on a slippery follow-up. CT head and CT maxilla spine is negative for any acute abnormality. No further work-up necessary.  Chronic pain syndrome/mood disorder/chronic anxiety/depression. Patient is on Cymbalta as well as gabapentin which I will continue. Continue Remeron.  Severe protein calorie malnutrition  Albumin 2.7 BMI 15 Encourage oral intake Continue oral supplement  Essential hypertension. Hypotension on admission. BP is currently at goal Continue to hold off home oral antihypertensives Continue to monitor vital signs  Generalized weakness likely multifactorial secondary to poor oral intake and symptomatic anemia Management as per above Fall precautions  Resolved post repletion: Hypokalemia Potassium 3.7 from 3.4, repleted orally Replete as indicated Repeat BMP in the  morning  Chronic constipation Started scheduled Senokot 2 tablets twice daily.    Nutrition: Cardiac diet DVT Prophylaxis:  Subcu Lovenox daily  Advance goals of care discussion: Full code patient's sister Ms. Kerman Passey will be his POA    Consults: none   Family Communication: no family was present at bedside, at the time of interview.   Disposition:  From: SNF Likely will need SNF on discharge.     Code Status: Full code   Consultants:  GI  Procedures:  None  Antimicrobials:  None   Status is: Inpatient    Dispo: The patient is from: SNF.              Anticipated d/c is to: SNF              Anticipated d/c date is: 07/02/2020 or when GI signs off.               Patient currently not stable at this time due to ongoing management of anemia.       Objective: Vitals:   06/30/20 0025 06/30/20 0631 06/30/20 1032 06/30/20 1315  BP: 107/60 (!) 105/59 129/76 130/76  Pulse: 68 67 66 69  Resp: 13 (!) 9 13 16   Temp: 98.3 F (36.8 C) 98.4 F (36.9 C) (!) 97.3 F (36.3 C) (!) 97.5 F (36.4 C)  TempSrc: Oral Oral Oral Oral  SpO2: 95%   100%  Weight:  43.2 kg    Height:        Intake/Output Summary (Last 24 hours) at 06/30/2020 1539 Last data filed at 06/30/2020 0027 Gross per 24 hour  Intake 363 ml  Output 900 ml  Net -537 ml   Filed Weights   06/28/20 1138 06/29/20 0528 06/30/20 0631  Weight: 54.4 kg 41.2 kg 43.2 kg    Exam:  . General: 74 y.o. year-old male Emaciated alert and interactive. . Cardiovascular: Regular rate and rhythm no rubs or gallops.  61 Respiratory: Clear to auscultation no wheezes or rales.   . Abdomen: Soft nontender normal bowel sounds present.  . Musculoskeletal: No lower extremity edema bilaterally.   [name] Kitchen Psychiatry: Mood is appropriate for condition and setting.   Data Reviewed: CBC: Recent Labs  Lab 06/28/20 1305 06/28/20 1905 06/29/20 0209 06/30/20 0906  WBC 8.7 8.6 6.4 5.5  HGB 7.5* 8.1* 7.4* 7.8*  HCT 23.2* 24.8* 22.1* 23.4*  MCV 72.0* 71.3* 69.9* 70.1*  PLT 277 274 232 324   Basic Metabolic Panel: Recent Labs  Lab 06/28/20 1305 06/29/20 0209 06/30/20 0906  NA 139 138 136  K 3.6 3.4* 3.7  CL 110 107 107  CO2 20* 22 21*  GLUCOSE  103* 93 94  BUN 19 17 11   CREATININE 0.81 0.70 0.69  CALCIUM 8.6* 8.5* 8.4*   GFR: Estimated Creatinine Clearance: 51 mL/min (by C-G formula based on SCr of 0.69 mg/dL). Liver Function Tests: Recent Labs  Lab 06/29/20 0209  AST 24  ALT 21  ALKPHOS 124  BILITOT 0.4  PROT 6.5  ALBUMIN 2.7*   No results for input(s): LIPASE, AMYLASE in the last 168 hours. No results for input(s): AMMONIA in the last 168 hours. Coagulation Profile: Recent Labs  Lab 06/28/20 1905  INR 1.2   Cardiac Enzymes: No results for input(s): CKTOTAL, CKMB, CKMBINDEX, TROPONINI in the last 168 hours. BNP (last 3 results) No results for input(s): PROBNP in the last 8760 hours. HbA1C: No results for input(s): HGBA1C in the last 72 hours. CBG: Recent  Labs  Lab 06/28/20 1313  GLUCAP 101*   Lipid Profile: No results for input(s): CHOL, HDL, LDLCALC, TRIG, CHOLHDL, LDLDIRECT in the last 72 hours. Thyroid Function Tests: No results for input(s): TSH, T4TOTAL, FREET4, T3FREE, THYROIDAB in the last 72 hours. Anemia Panel: Recent Labs    06/28/20 1905  VITAMINB12 348  FOLATE 29.5  FERRITIN 1,483*  TIBC 161*  IRON 26*   Urine analysis:    Component Value Date/Time   COLORURINE YELLOW 02/12/2014 1551   APPEARANCEUR CLOUDY (A) 02/12/2014 1551   LABSPEC 1.023 02/12/2014 1551   PHURINE 6.0 02/12/2014 1551   GLUCOSEU NEGATIVE 02/12/2014 1551   HGBUR NEGATIVE 02/12/2014 1551   BILIRUBINUR NEGATIVE 02/12/2014 1551   KETONESUR NEGATIVE 02/12/2014 1551   PROTEINUR NEGATIVE 02/12/2014 1551   UROBILINOGEN 1.0 02/12/2014 1551   NITRITE NEGATIVE 02/12/2014 1551   LEUKOCYTESUR LARGE (A) 02/12/2014 1551   Sepsis Labs: @LABRCNTIP (procalcitonin:4,lacticidven:4)  ) Recent Results (from the past 240 hour(s))  Respiratory Panel by RT PCR (Flu A&B, Covid) - Nasopharyngeal Swab     Status: None   Collection Time: 06/28/20  3:10 PM   Specimen: Nasopharyngeal Swab; Nasopharyngeal(NP) swabs in vial transport  medium  Result Value Ref Range Status   SARS Coronavirus 2 by RT PCR NEGATIVE NEGATIVE Final    Comment: (NOTE) SARS-CoV-2 target nucleic acids are NOT DETECTED.  The SARS-CoV-2 RNA is generally detectable in upper respiratoy specimens during the acute phase of infection. The lowest concentration of SARS-CoV-2 viral copies this assay can detect is 131 copies/mL. A negative result does not preclude SARS-Cov-2 infection and should not be used as the sole basis for treatment or other patient management decisions. A negative result may occur with  improper specimen collection/handling, submission of specimen other than nasopharyngeal swab, presence of viral mutation(s) within the areas targeted by this assay, and inadequate number of viral copies (<131 copies/mL). A negative result must be combined with clinical observations, patient history, and epidemiological information. The expected result is Negative.  Fact Sheet for Patients:  https://www.moore.com/https://www.fda.gov/media/142436/download  Fact Sheet for Healthcare Providers:  https://www.young.biz/https://www.fda.gov/media/142435/download  This test is no t yet approved or cleared by the [place] FDA and  has been authorized for detection and/or diagnosis of SARS-CoV-2 by FDA under an Emergency Use Authorization (EUA). This EUA will remain  in effect (meaning this test can be used) for the duration of the COVID-19 declaration under Section 564(b)(1) of the Act, 21 U.S.C. section 360bbb-3(b)(1), unless the authorization is terminated or revoked sooner.     Influenza A by PCR NEGATIVE NEGATIVE Final   Influenza B by PCR NEGATIVE NEGATIVE Final    Comment: (NOTE) The Xpert Xpress SARS-CoV-2/FLU/RSV assay is intended as an aid in  the diagnosis of influenza from Nasopharyngeal swab specimens and  should not be used as a sole basis for treatment. Nasal washings and  aspirates are unacceptable for Xpert Xpress SARS-CoV-2/FLU/RSV  testing.  Fact Sheet for  Patients: https://www.moore.com/https://www.fda.gov/media/142436/download  Fact Sheet for Healthcare Providers: https://www.young.biz/https://www.fda.gov/media/142435/download  This test is not yet approved or cleared by the [place] FDA and  has been authorized for detection and/or diagnosis of SARS-CoV-2 by  FDA under an Emergency Use Authorization (EUA). This EUA will remain  in effect (meaning this test can be used) for the duration of the  Covid-19 declaration under Section 564(b)(1) of the Act, 21  U.S.C. section 360bbb-3(b)(1), unless the authorization is  terminated or revoked. Performed at [facility], [address] [place], [place]  [address]       Studies: No results found.  Scheduled Meds: . DULoxetine  30 mg Oral QHS  . feeding supplement  1 Container Oral TID BM  . ferrous sulfate  325 mg Oral TID WC  . gabapentin  600 mg Oral BID  . influenza vaccine adjuvanted  0.5 mL Intramuscular Tomorrow-1000  . latanoprost  1 drop Both Eyes QHS  . levETIRAcetam  750 mg Oral BID  . mirtazapine  7.5 mg Oral Daily  . multivitamin with minerals  1 tablet Oral Daily  . pneumococcal 23 valent vaccine  0.5 mL Intramuscular Tomorrow-1000  . sodium chloride flush  3 mL Intravenous Q12H  . zonisamide  150 mg Oral Daily    Continuous Infusions: . sodium chloride       LOS: 1 day     Darlin Drop, MD [facility] Pager [phone]  If 7PM-7AM, please contact night-coverage [email or web address] Password [facility] 06/30/2020, 3:39 PM

## 2020-06-30 NOTE — Consult Note (Signed)
Referring Provider:  TH Primary Care Physician:  Herma MeringIbrahim, Sadou, NP Primary Gastroenterologist:  Dr. Bosie ClosSchooler  Reason for Consultation: Microcytic anemia  HPI: [name] is a 74 y.o. male with past medical history of CVA with residual right-sided weakness, wheelchair-bound, history of seizure disorder presented to the hospital from skilled nursing facility after having a fall.  He was found to have anemia with hemoglobin of 7.5.  Hemoccult negative.  GI is consulted for further evaluation.  Patient seen and examined at bedside.  He looks cachectic but denies any GI symptoms.  According to him his weight is stable.  He denies any nausea or vomiting.  Denies blood in the stool or black stool.  Denies reflux, trouble swallowing or pain while swallowing.  Last colonoscopy in May 2016 by Dr. Bosie ClosSchooler showed diverticulosis .  Repeat was not recommended because of his age.  Past Medical History:  Diagnosis Date  . CVA (cerebral infarction)   . Dilantin toxicity   . 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 History of craniotomy   . Hypertension   . Organic brain syndrome   . Pneumonia   . Polysubstance abuse (HCC)   . Right hemiparesis (HCC)   . SDH (subdural hematoma) (HCC)   . Seizures (HCC)     Past Surgical History:  Procedure Laterality Date  . COLONOSCOPY WITH PROPOFOL N/A 12/15/2014   Procedure: COLONOSCOPY WITH PROPOFOL;  Surgeon: Charlott RakesVincent Schooler, MD;  [facility];  Service: Endoscopy;  Laterality: N/A;  . CRANIOTOMY     following head trauma    Prior to Admission medications   Medication Sig Start Date End Date Taking? Authorizing Provider  acetaminophen (TYLENOL) 325 MG tablet Take 650 mg by mouth every morning. AND MAY TAKE AN ADDITIONAL 650 MG EVERY 8 HOURS AS NEEDED FOR PAIN   Yes [provider]  aspirin EC 81 MG tablet Take 81 mg by mouth daily.   Yes [provider]  Cholecalciferol (VITAMIN D3) 10 MCG (400 UNIT) tablet Take 800 Units by mouth  daily.    Yes [provider]  DULoxetine (CYMBALTA) 30 MG capsule Take 30 mg by mouth at bedtime.    Yes [provider]  Emollient (AQUAPHOR ADVANCED THERAPY) OINT Apply 1 application topically daily. Spread topically to bilateral lower extremities once daily   Yes [provider]  gabapentin (NEURONTIN) 600 MG tablet Take 600 mg by mouth 2 (two) times daily.   Yes [provider]  latanoprost (XALATAN) 0.005 % ophthalmic solution Place 1 drop into both eyes at bedtime.   Yes [provider]  levETIRAcetam (KEPPRA) 750 MG tablet Take 750 mg by mouth 2 (two) times daily.   Yes [provider]  lisinopril (ZESTRIL) 10 MG tablet Take 10 mg by mouth daily.    Yes [provider]  Menthol, Topical Analgesic, 4 % GEL Apply 1 application topically in the morning and at bedtime. Apply to both shoulders   Yes [provider]  mirtazapine (REMERON) 7.5 MG tablet Take 7.5 mg by mouth daily.    Yes [provider]  Multiple Vitamin (TAB-A-VITE) TABS Take 1 tablet by mouth every morning.   Yes [provider]  phenytoin (DILANTIN) 100 MG ER capsule Take 3 capsules (300 mg total) by mouth every evening. Patient taking differently: Take 300 mg by mouth at bedtime.  07/26/16  Yes York SpanielWillis, Charles K, MD  polyethylene glycol (MIRALAX / GLYCOLAX) 17 g packet Take 17 g by mouth daily as needed  for mild constipation.   Yes [provider]  UNABLE TO FIND "Mighty Shake": Drink 1 shake by mouth three times a day with meals   Yes [provider]  zonisamide (ZONEGRAN) 50 MG capsule Take 3 capsules (150 mg total) by mouth daily. Patient taking differently: Take 100 mg by mouth in the morning and at bedtime.  10/23/16  Yes Joseph Art, DO    Scheduled Meds: . DULoxetine  30 mg Oral QHS  . feeding supplement  1 Container Oral TID BM  . ferrous sulfate  325 mg Oral TID WC  . gabapentin  600 mg Oral BID  .  influenza vaccine adjuvanted  0.5 mL Intramuscular Tomorrow-1000  . latanoprost  1 drop Both Eyes QHS  . levETIRAcetam  750 mg Oral BID  . mirtazapine  7.5 mg Oral Daily  . multivitamin with minerals  1 tablet Oral Daily  . pneumococcal 23 valent vaccine  0.5 mL Intramuscular Tomorrow-1000  . sodium chloride flush  3 mL Intravenous Q12H  . zonisamide  150 mg Oral Daily   Continuous Infusions: . sodium chloride     PRN Meds:.acetaminophen **OR** acetaminophen, ondansetron **OR** ondansetron (ZOFRAN) IV, polyethylene glycol  Allergies as of 06/28/2020 - Review Complete 06/28/2020  Allergen Reaction Noted  . Carbamazepine Other (See Comments) 12/18/2011  . Tricyclic antidepressants Other (See Comments) 12/18/2011    Family History  Problem Relation Age of Onset  . Heart attack Mother   . Heart attack Father     Social History   Socioeconomic History  . Marital status: Divorced    Spouse name: Not on file  . Number of children: 1  . Years of education: 36  . Highest education level: Not on file  Occupational History  . Occupation: retired  Tobacco Use  . Smoking status: Current Every Day Smoker    Packs/day: 1.00  . Smokeless tobacco: Never Used  Substance and Sexual Activity  . Alcohol use: No    Alcohol/week: 0.0 standard drinks  . Drug use: No  . Sexual activity: Never  Other Topics Concern  . Not on file  Social History Narrative   Patient lives at [facility].    Retired.   Education 12 th grade.   Left handed.   Social Determinants of Health   Financial Resource Strain:   . Difficulty of Paying Living Expenses: Not on file  Food Insecurity:   . Worried About Programme researcher, broadcasting/film/video in the Last Year: Not on file  . Ran Out of Food in the Last Year: Not on file  Transportation Needs:   . Lack of Transportation (Medical): Not on file  . Lack of Transportation (Non-Medical): Not on file  Physical Activity:   . Days of Exercise per Week: Not on file  . Minutes  of Exercise per Session: Not on file  Stress:   . Feeling of Stress : Not on file  Social Connections:   . Frequency of Communication with Friends and Family: Not on file  . Frequency of Social Gatherings with Friends and Family: Not on file  . Attends Religious Services: Not on file  . Active Member of Clubs or Organizations: Not on file  . Attends Banker Meetings: Not on file  . Marital Status: Not on file  Intimate Partner Violence:   . Fear of Current or Ex-Partner: Not on file  . Emotionally Abused: Not on file  . Physically Abused: Not on file  . Sexually Abused:  Not on file    Review of Systems: All negative except as stated above in HPI.  Physical Exam: Vital signs: Vitals:   06/30/20 0025 06/30/20 0631  BP: 107/60 (!) 105/59  Pulse: 68 67  Resp: 13 (!) 9  Temp: 98.3 F (36.8 C) 98.4 F (36.9 C)  SpO2: 95%    Physical Exam Constitutional:      Comments: Cachectic  HENT:     Head: Normocephalic.     Ears:     Comments: Right eye appears erythematic and injected.    Nose: Nose normal.     Mouth/Throat:     Mouth: Mucous membranes are moist.     Pharynx: No oropharyngeal exudate.  Eyes:     Extraocular Movements: Extraocular movements intact.  Cardiovascular:     Rate and Rhythm: Normal rate and regular rhythm.     Heart sounds: No murmur heard.   Pulmonary:     Effort: Pulmonary effort is normal. No respiratory distress.  Abdominal:     General: Bowel sounds are normal. There is no distension.     Palpations: Abdomen is soft.     Tenderness: There is no abdominal tenderness. There is no guarding.  Musculoskeletal:     Cervical back: Normal range of motion and neck supple.     Right lower leg: No edema.     Left lower leg: No edema.  Skin:    General: Skin is warm.     Coloration: Skin is not jaundiced.  Neurological:     Mental Status: He is alert and oriented to person, place, and time.  Psychiatric:        Behavior: Behavior  normal.        Thought Content: Thought content normal.        Judgment: Judgment normal.     GI:  Lab Results: Recent Labs    06/28/20 1905 06/29/20 0209 06/30/20 0906  WBC 8.6 6.4 5.5  HGB 8.1* 7.4* 7.8*  HCT 24.8* 22.1* 23.4*  PLT 274 232 324   BMET Recent Labs    06/28/20 1305 06/29/20 0209  NA 139 138  K 3.6 3.4*  CL 110 107  CO2 20* 22  GLUCOSE 103* 93  BUN 19 17  CREATININE 0.81 0.70  CALCIUM 8.6* 8.5*   LFT Recent Labs    06/29/20 0209  PROT 6.5  ALBUMIN 2.7*  AST 24  ALT 21  ALKPHOS 124  BILITOT 0.4   PT/INR Recent Labs    06/28/20 1905  LABPROT 14.9  INR 1.2     Studies/Results: CT HEAD WO CONTRAST  Result Date: 06/28/2020 CLINICAL DATA:  Facial trauma. Additional history provided: Patient reports fall yesterday striking right forehead, right eye ecchymosis. EXAM: CT HEAD WITHOUT CONTRAST CT MAXILLOFACIAL WITHOUT CONTRAST TECHNIQUE: Multidetector CT imaging of the head and maxillofacial structures were performed using the standard protocol without intravenous contrast. Multiplanar CT image reconstructions of the maxillofacial structures were also generated. COMPARISON:  Brain MRI 10/21/2016. Head CT 10/21/2016. Maxillofacial CT 08/01/2006. FINDINGS: CT HEAD FINDINGS Brain: Streak and beam hardening artifact arising from a metallic right cranioplasty limits evaluation. Moderate generalized cerebral and cerebellar atrophy. Mild ill-defined hypoattenuation within the cerebral white matter is nonspecific, but compatible with chronic small vessel ischemic disease. Within described limitations, there is no evidence of acute intracranial hemorrhage, demarcated cortical infarct or extra-axial fluid collection. No evidence of intracranial mass. No midline shift. Vascular: No hyperdense vessel.  Atherosclerotic calcifications. Skull: Right-sided cranioplasty.  No calvarial  fracture. CT MAXILLOFACIAL FINDINGS Osseous: No acute maxillofacial fracture is  identified. Orbits: Right periorbital soft tissue swelling. Otherwise, there is no acute finding. The globes are normal in size and contour. The extraocular muscles and optic nerve sheath complexes are symmetric and unremarkable. Sinuses: Trace scattered paranasal sinus mucosal thickening. Soft tissues: Right forehead/periorbital soft tissue swelling. Other: Poor dentition with multiple absent and carious teeth and multifocal periapical lucencies. IMPRESSION: CT head: 1. Examination limited by streak and beam hardening artifact arising from a metallic cranioplasty on the right. 2. Within this limitation, there is no evidence of acute intracranial abnormality. 3. Moderate cerebral and cerebellar atrophy with mild chronic small vessel ischemic disease. CT maxillofacial: 1. No evidence of acute orbital or maxillofacial fracture. 2. Right forehead/periorbital soft tissue swelling. 3. Incidentally noted, there are multiple absent and carious teeth with multifocal periapical lucencies. Electronically Signed   By: Jackey Loge DO   On: 06/28/2020 15:14   CT Maxillofacial Wo Contrast  Result Date: 06/28/2020 CLINICAL DATA:  Facial trauma. Additional history provided: Patient reports fall yesterday striking right forehead, right eye ecchymosis. EXAM: CT HEAD WITHOUT CONTRAST CT MAXILLOFACIAL WITHOUT CONTRAST TECHNIQUE: Multidetector CT imaging of the head and maxillofacial structures were performed using the standard protocol without intravenous contrast. Multiplanar CT image reconstructions of the maxillofacial structures were also generated. COMPARISON:  Brain MRI 10/21/2016. Head CT 10/21/2016. Maxillofacial CT 08/01/2006. FINDINGS: CT HEAD FINDINGS Brain: Streak and beam hardening artifact arising from a metallic right cranioplasty limits evaluation. Moderate generalized cerebral and cerebellar atrophy. Mild ill-defined hypoattenuation within the cerebral white matter is nonspecific, but compatible with chronic  small vessel ischemic disease. Within described limitations, there is no evidence of acute intracranial hemorrhage, demarcated cortical infarct or extra-axial fluid collection. No evidence of intracranial mass. No midline shift. Vascular: No hyperdense vessel.  Atherosclerotic calcifications. Skull: Right-sided cranioplasty.  No calvarial fracture. CT MAXILLOFACIAL FINDINGS Osseous: No acute maxillofacial fracture is identified. Orbits: Right periorbital soft tissue swelling. Otherwise, there is no acute finding. The globes are normal in size and contour. The extraocular muscles and optic nerve sheath complexes are symmetric and unremarkable. Sinuses: Trace scattered paranasal sinus mucosal thickening. Soft tissues: Right forehead/periorbital soft tissue swelling. Other: Poor dentition with multiple absent and carious teeth and multifocal periapical lucencies. IMPRESSION: CT head: 1. Examination limited by streak and beam hardening artifact arising from a metallic cranioplasty on the right. 2. Within this limitation, there is no evidence of acute intracranial abnormality. 3. Moderate cerebral and cerebellar atrophy with mild chronic small vessel ischemic disease. CT maxillofacial: 1. No evidence of acute orbital or maxillofacial fracture. 2. Right forehead/periorbital soft tissue swelling. 3. Incidentally noted, there are multiple absent and carious teeth with multifocal periapical lucencies. Electronically Signed   By: Jackey Loge DO   On: 06/28/2020 15:14    Impression/Plan: -Chronic microcytic anemia.  Hemoccult negative.  Iron study shows low iron, low TIBC and borderline low saturation.  Elevated ferritin more than thousand.  Iron studies not consistent with iron deficiency anemia. -History of CVA  Recommendations ---------------------- -Check CT abdomen pelvis with IV and oral contrast to rule out any underlying malignancy -Continue clear liquid diet for now. -Inpatient EGD and colonoscopy if any  obvious abnormality seen on the CT scan or ongoing drop in hemoglobin. -Dr. Dulce Sellar will follow tomorrow.   LOS: 1 day   Kathi Der  MD, FACP 06/30/2020, 10:09 AM  Contact #  [phone]

## 2020-07-01 DIAGNOSIS — D509 Iron deficiency anemia, unspecified: Secondary | ICD-10-CM | POA: Diagnosis not present

## 2020-07-01 LAB — BASIC METABOLIC PANEL
Anion gap: 7 (ref 5–15)
BUN: 8 mg/dL (ref 8–23)
CO2: 21 mmol/L — ABNORMAL LOW (ref 22–32)
Calcium: 8.3 mg/dL — ABNORMAL LOW (ref 8.9–10.3)
Chloride: 107 mmol/L (ref 98–111)
Creatinine, Ser: 0.53 mg/dL — ABNORMAL LOW (ref 0.61–1.24)
GFR, Estimated: >60 mL/min (ref >60)
Glucose, Bld: 133 mg/dL — ABNORMAL HIGH (ref 70–99)
Potassium: 3.1 mmol/L — ABNORMAL LOW (ref 3.5–5.1)
Sodium: 135 mmol/L (ref 135–145)

## 2020-07-01 LAB — CBC
HCT: 21.9 % — ABNORMAL LOW (ref 39.0–52.0)
Hemoglobin: 7.2 g/dL — ABNORMAL LOW (ref 13.0–17.0)
MCH: 22.9 pg — ABNORMAL LOW (ref 26.0–34.0)
MCHC: 32.9 g/dL (ref 30.0–36.0)
MCV: 69.5 fL — ABNORMAL LOW (ref 80.0–100.0)
Platelets: 284 10*3/uL (ref 150–400)
RBC: 3.15 MIL/uL — ABNORMAL LOW (ref 4.22–5.81)
RDW: 16.3 % — ABNORMAL HIGH (ref 11.5–15.5)
WBC: 6.4 10*3/uL (ref 4.0–10.5)
nRBC: 0 % (ref 0.0–0.2)

## 2020-07-01 LAB — ALBUMIN: Albumin: 2.6 g/dL — ABNORMAL LOW (ref 3.5–5.0)

## 2020-07-01 LAB — PHENYTOIN LEVEL, TOTAL: Phenytoin Lvl: 8.6 ug/mL — ABNORMAL LOW (ref 10.0–20.0)

## 2020-07-01 LAB — MAGNESIUM: Magnesium: 1.7 mg/dL (ref 1.7–2.4)

## 2020-07-01 MED ORDER — FOSFOMYCIN TROMETHAMINE 3 G PO PACK
3.0000 g | PACK | Freq: Once | ORAL | Status: AC
  Administered 2020-07-01: 3 g via ORAL
  Filled 2020-07-01: qty 3

## 2020-07-01 MED ORDER — FERROUS SULFATE 325 (65 FE) MG PO TABS
325.0000 mg | ORAL_TABLET | Freq: Every day | ORAL | 0 refills | Status: DC
Start: 2020-07-01 — End: 2021-12-11

## 2020-07-01 MED ORDER — PHENYTOIN SODIUM EXTENDED 30 MG PO CAPS
230.0000 mg | ORAL_CAPSULE | Freq: Every day | ORAL | Status: DC
  Administered 2020-07-01: 230 mg via ORAL
  Filled 2020-07-01: qty 1

## 2020-07-01 MED ORDER — PHENYTOIN SODIUM EXTENDED 30 MG PO CAPS
230.0000 mg | ORAL_CAPSULE | Freq: Every day | ORAL | 0 refills | Status: DC
Start: 2020-07-02 — End: 2021-12-06

## 2020-07-01 MED ORDER — PHENYTOIN SODIUM EXTENDED 100 MG PO CAPS
200.0000 mg | ORAL_CAPSULE | Freq: Every day | ORAL | Status: DC
  Filled 2020-07-01: qty 2

## 2020-07-01 MED ORDER — POTASSIUM CHLORIDE CRYS ER 20 MEQ PO TBCR
20.0000 meq | EXTENDED_RELEASE_TABLET | Freq: Every day | ORAL | 0 refills | Status: DC
Start: 2020-07-01 — End: 2021-12-11

## 2020-07-01 MED ORDER — SENNOSIDES-DOCUSATE SODIUM 8.6-50 MG PO TABS: 1.0000 | ORAL_TABLET | Freq: Every day | ORAL | 0 refills | Status: AC

## 2020-07-01 MED ORDER — PHENYTOIN SODIUM EXTENDED 30 MG PO CAPS: 30.0000 mg | ORAL_CAPSULE | Freq: Every day | ORAL | Status: DC

## 2020-07-01 MED ORDER — POTASSIUM CHLORIDE CRYS ER 20 MEQ PO TBCR
40.0000 meq | EXTENDED_RELEASE_TABLET | Freq: Two times a day (BID) | ORAL | Status: DC
  Administered 2020-07-01: 40 meq via ORAL
  Filled 2020-07-01: qty 2

## 2020-07-01 NOTE — Progress Notes (Signed)
Subjective: No complaints  Objective: Vital signs in last 24 hours: Temp:  [97.5 F (36.4 C)-98.3 F (36.8 C)] 98.2 F (36.8 C) (11/26 0500) Pulse Rate:  [67-72] 72 (11/26 0500) Resp:  [11-16] 16 (11/26 0500) BP: (118-130)/(51-76) 118/51 (11/26 0500) SpO2:  [96 %-100 %] 97 % (11/26 0500) Weight change:    PE: GEN:  Thin, frail, cachectic ABD:  Thin, soft, non-tender  Lab Results: CBC    Component Value Date/Time   WBC 6.4 07/01/2020 0217   RBC 3.15 (L) 07/01/2020 0217   HGB 7.2 (L) 07/01/2020 0217   HGB 10.1 (L) 09/06/2015 1127   HCT 21.9 (L) 07/01/2020 0217   HCT 31.6 (L) 09/06/2015 1127   PLT 284 07/01/2020 0217   PLT 252 09/06/2015 1127   MCV 69.5 (L) 07/01/2020 0217   MCV 73 (L) 09/06/2015 1127   MCH 22.9 (L) 07/01/2020 0217   MCHC 32.9 07/01/2020 0217   RDW 16.3 (H) 07/01/2020 0217   RDW 16.5 (H) 09/06/2015 1127   LYMPHSABS 2.0 10/21/2016 2031   LYMPHSABS 2.0 09/06/2015 1127   MONOABS 1.2 (H) 10/21/2016 2031   EOSABS 0.3 10/21/2016 2031   EOSABS 0.3 09/06/2015 1127   BASOSABS 0.0 10/21/2016 2031   BASOSABS 0.0 09/06/2015 1127   CMP     Component Value Date/Time   NA 135 07/01/2020 0217   NA 143 09/06/2015 1127   K 3.1 (L) 07/01/2020 0217   CL 107 07/01/2020 0217   CO2 21 (L) 07/01/2020 0217   GLUCOSE 133 (H) 07/01/2020 0217   BUN 8 07/01/2020 0217   BUN 21 09/06/2015 1127   CREATININE 0.53 (L) 07/01/2020 0217   CALCIUM 8.3 (L) 07/01/2020 0217   PROT 6.5 06/29/2020 0209   PROT 6.6 09/06/2015 1127   ALBUMIN 2.6 (L) 07/01/2020 0217   ALBUMIN 4.0 09/06/2015 1127   AST 24 06/29/2020 0209   ALT 21 06/29/2020 0209   ALKPHOS 124 06/29/2020 0209   BILITOT 0.4 06/29/2020 0209   BILITOT <0.2 09/06/2015 1127   GFRNONAA >60 07/01/2020 0217   GFRAA >60 10/21/2016 2031   Studies/Results: CT ABDOMEN PELVIS W CONTRAST  Result Date: 06/30/2020 CLINICAL DATA:  Anemia. EXAM: CT ABDOMEN AND PELVIS WITH CONTRAST TECHNIQUE: Multidetector CT imaging of the  abdomen and pelvis was performed using the standard protocol following bolus administration of intravenous contrast. CONTRAST:  OMNIPAQUE IOHEXOL 300 MG/ML  SOLN COMPARISON:  None. FINDINGS: Lower chest: Centrilobular emphysematous changes are present. Linear scarring or atelectasis is present at both bases. No significant consolidation is present. Focal thickening is present along the minor fissure. This could represent a small amount of fluid. No other significant effusion is present. The heart size is normal. Hepatobiliary: No focal liver abnormality is seen. No gallstones, gallbladder wall thickening, or biliary dilatation. Pancreas: Unremarkable. No pancreatic ductal dilatation or surrounding inflammatory changes. Spleen: Normal in size without focal abnormality. Adrenals/Urinary Tract: The adrenal glands are normal bilaterally. Simple cyst at the lower pole of the right kidney measures up to 3.5 cm. 4 cm cyst is present near the upper pole of the right kidney. Ureters are within normal limits bilaterally. Marked wall thickening present circumferentially in the urinary bladder. Right-sided diverticulum is present. Stomach/Bowel: Stomach and duodenum are within normal limits. Small bowel is unremarkable. Terminal ileum is within limits. The ascending transverse colon are within limits. Descending and sigmoid colon are within as well. Vascular/Lymphatic: Extensive vascular calcifications are present. No aneurysm is present. No significant adenopathy present. Reproductive: Prostate is  unremarkable. Other: No abdominal wall hernia or abnormality. No abdominopelvic ascites. Musculoskeletal: Vertebral body heights and alignment are. No lytic or blastic lesions are present. Bony pelvis is within normal limits. IMPRESSION: 1. Marked wall thickening in the urinary bladder suggesting cystitis or urinary tract infection. 2. No other acute or focal lesion to explain the patient's anemia. No retroperitoneal hematoma.  3. Aortic Atherosclerosis (ICD10-I70.0) and Emphysema (ICD10-J43.9). Electronically Signed   By: Marin Roberts [name]   On: 06/30/2020 20:46   Assessment:  1.  Microcytic anemia, with indices most consistent with anemia of chronic disease. 2.  No overt GI bleeding. 3.  CT scan negative for GI malignancy/lesion. 4.  History stroke.  Plan:  1.  No plans for endoscopy or colonoscopy in absence of acute destabilizing bleeding (which patient does not have). 2.  Recommend hematology consultation (inpatient versus outpatient) to investigation of anemia. 3.  Patient can follow-up with Dr. Bosie Clos in [facility] as outpatient.  4.  [facility] will sign-off; please call with questions; thank you for the consultation.   Freddy Jaksch 07/01/2020, 12:59 PM   Cell [phone] If no answer or after 5 PM call [phone]

## 2020-07-01 NOTE — Progress Notes (Addendum)
Marland Kitchen.   PROGRESS NOTE  Peter ReeseDonnie R [name] XWR:[id number] DOB: 1945/12/24 DOA: 06/28/2020 PCP: Herma MeringIbrahim, Sadou, NP  HPI/Recap of past 24 hours: [name] is a 74 y.o. male with Past medical history of past medical history of seizure disorder, hypertension, CVA with right-sided weakness, wheelchair-bound at baseline presents with a fall and eye injury. Patient is from SNF while he was trying to get out of his wheelchair yesterday to get into the bed the floor was slippery and had a fall.  He stayed on the ground for almost 40 minutes before his roommate came in to help him.  He denies having any head injury or neck injury denies having any loss of consciousness.  When RN at the facility went to see him found that he had some bruises on his right side of the face and further he needs to be evaluated by the ER. At the time of my evaluation in the ER patient denies having any complaints of headache, dizziness, chest pain, shortness of breath, fever, chills.  Also denies any nausea or vomiting.  Denies any diarrhea.  Denies any blood in the stool. Denies any change in the medication. Takes aspirin but denies any ibuprofen Aleve naproxen medicines like that. Denies any prior history of upper GI endoscopy as well. Denies any heartburn or acid reflux. Reportedly patient was having soft blood pressure when the EMS found him.   07/01/20: Seen and examined.  He has no new complaints.  Seen by GI, no plan for EGD or colonoscopy during this admission.  Will follow up with GI and hematology outpatient.  CT abdomen and pelvis completed, no evidence of GI malignancy or lesion by CT scan.  GI signed off.    Assessment/Plan: Principal Problem:   Microcytic anemia Active Problems:   Seizure disorder (HCC)   Hemiparesis affecting dominant side as late effect of stroke (HCC)   Gait disorder   Essential hypertension   Syncope   Pressure injury of skin  Chronic microcytic anemia/iron deficiency  anemia Patient presents with generalized weakness and fatigue and a fall. Hemoglobin 7.5> 7.8.>7.2 Repeat H&H at 1800 Baseline hemoglobin appears to be 9.5 Negative FOBT Was seen by GI, signed off on 11/21.  No planned EGD or colonoscopy during this admission. CT abdomen and pelvis done on 06-30-20 showed no evidence of malignancy or lesion. Will need to follow-up with GI and hematology outpatient. Iron studies consistent with iron deficiency Continue ferrous sulfate  Seizure disorder. On phenytoin and Keppra. Phenytoin level elevated 22.7 on 06-29-20 Repeat phenytoin level 8.6 on 07-01-20, restarted at 230 mg daily. Repeat phenytoin level in 5-7 days. Continue Keppra Continue seizure precautions  Unwitnessed fall. Etiology of the fall is not clear but per patient's history it appears to be a mechanical fall on a slippery follow-up. CT head and CT maxilla spine is negative for any acute abnormality. No further work-up necessary.  Chronic pain syndrome/mood disorder/chronic anxiety/depression. Patient is on Cymbalta as well as gabapentin which I will continue. Continue Remeron.  Severe protein calorie malnutrition  Albumin 2.7 BMI 15 Encourage oral intake Continue oral supplement  Essential hypertension. Hypotension on admission. BP is currently at goal Continue to hold off home oral antihypertensives Continue to monitor vital signs  Generalized weakness likely multifactorial secondary to poor oral intake and symptomatic anemia PT assessment recommended return to assisted living facility. Continue fall precautions. TOC assisting with return to facility.  Refractory Hypokalemia Potassium 3.1 from 3.7 from 3.4, repleted orally Repeat serum K+  at 1800  Chronic constipation Started scheduled Senokot 2 tablets twice daily.    Nutrition: Cardiac diet DVT Prophylaxis:  Subcu Lovenox daily  Advance goals of care discussion: Full code patient's sister Ms. Kerman Passey will be his POA   Consults: none   Family Communication: no family was present at bedside, at the time of interview.   Disposition:  From:  Assisted living facility.     Code Status: Full code   Consultants:  GI  Procedures:  None  Antimicrobials:  None   Status is: Inpatient    Dispo: The patient is from: Assisted living facility.              Anticipated d/c is to: Assisted living facility.              Anticipated d/c date is: 07/02/2020               Medically stable for discharge.     Objective: Vitals:   06/30/20 2138 06/30/20 2139 07/01/20 0500 07/01/20 1336  BP: 120/65  (!) 118/51 117/64  Pulse:   72 86  Resp: 11  16 15   Temp:  98.3 F (36.8 C) 98.2 F (36.8 C) 97.6 F (36.4 C)  TempSrc:   Oral Oral  SpO2:   97% 98%  Weight:      Height:        Intake/Output Summary (Last 24 hours) at 07/01/2020 1437 Last data filed at 07/01/2020 1300 Gross per 24 hour  Intake 623 ml  Output 700 ml  Net -77 ml   Filed Weights   06/28/20 1138 06/29/20 0528 06/30/20 0631  Weight: 54.4 kg 41.2 kg 43.2 kg    Exam:  . General: 74 y.o. year-old male Emaciated, pleasant alert and interactive. . Cardiovascular: Regular rate and rhythm no rubs or gallops. 61 Respiratory: Clear auscultation no wheezes or rales.   . Abdomen: Soft nontender normal bowel sounds present.  Musculoskeletal: No lower extremity edema bilaterally. Marland Kitchen Psychiatry: Mood is appropriate for condition and setting.   Data Reviewed: CBC: Recent Labs  Lab 06/28/20 1305 06/28/20 1905 06/29/20 0209 06/30/20 0906 07/01/20 0217  WBC 8.7 8.6 6.4 5.5 6.4  HGB 7.5* 8.1* 7.4* 7.8* 7.2*  HCT 23.2* 24.8* 22.1* 23.4* 21.9*  MCV 72.0* 71.3* 69.9* 70.1* 69.5*  PLT 277 274 232 324 284   Basic Metabolic Panel: Recent Labs  Lab 06/28/20 1305 06/29/20 0209 06/30/20 0906 07/01/20 0217  NA 139 138 136 135  K 3.6 3.4* 3.7 3.1*  CL 110 107 107 107  CO2 20* 22 21* 21*  GLUCOSE 103*  93 94 133*  BUN 19 17 11 8   CREATININE 0.81 0.70 0.69 0.53*  CALCIUM 8.6* 8.5* 8.4* 8.3*  MG  --   --   --  1.7   GFR: Estimated Creatinine Clearance: 51 mL/min (A) (by C-G formula based on SCr of 0.53 mg/dL (L)). Liver Function Tests: Recent Labs  Lab 06/29/20 0209 07/01/20 0217  AST 24  --   ALT 21  --   ALKPHOS 124  --   BILITOT 0.4  --   PROT 6.5  --   ALBUMIN 2.7* 2.6*   No results for input(s): LIPASE, AMYLASE in the last 168 hours. No results for input(s): AMMONIA in the last 168 hours. Coagulation Profile: Recent Labs  Lab 06/28/20 1905  INR 1.2   Cardiac Enzymes: No results for input(s): CKTOTAL, CKMB, CKMBINDEX, TROPONINI in the last 168 hours. BNP (last 3  results) No results for input(s): PROBNP in the last 8760 hours. HbA1C: No results for input(s): HGBA1C in the last 72 hours. CBG: Recent Labs  Lab 06/28/20 1313  GLUCAP 101*   Lipid Profile: No results for input(s): CHOL, HDL, LDLCALC, TRIG, CHOLHDL, LDLDIRECT in the last 72 hours. Thyroid Function Tests: No results for input(s): TSH, T4TOTAL, FREET4, T3FREE, THYROIDAB in the last 72 hours. Anemia Panel: Recent Labs    06/28/20 1905  VITAMINB12 348  FOLATE 29.5  FERRITIN 1,483*  TIBC 161*  IRON 26*   Urine analysis:    Component Value Date/Time   COLORURINE YELLOW 02/12/2014 1551   APPEARANCEUR CLOUDY (A) 02/12/2014 1551   LABSPEC 1.023 02/12/2014 1551   PHURINE 6.0 02/12/2014 1551   GLUCOSEU NEGATIVE 02/12/2014 1551   HGBUR NEGATIVE 02/12/2014 1551   BILIRUBINUR NEGATIVE 02/12/2014 1551   KETONESUR NEGATIVE 02/12/2014 1551   PROTEINUR NEGATIVE 02/12/2014 1551   UROBILINOGEN 1.0 02/12/2014 1551   NITRITE NEGATIVE 02/12/2014 1551   LEUKOCYTESUR LARGE (A) 02/12/2014 1551   Sepsis Labs: @LABRCNTIP (procalcitonin:4,lacticidven:4)  ) Recent Results (from the past 240 hour(s))  Respiratory Panel by RT PCR (Flu A&B, Covid) - Nasopharyngeal Swab     Status: None   Collection Time:  06/28/20  3:10 PM   Specimen: Nasopharyngeal Swab; Nasopharyngeal(NP) swabs in vial transport medium  Result Value Ref Range Status   SARS Coronavirus 2 by RT PCR NEGATIVE NEGATIVE Final    Comment: (NOTE) SARS-CoV-2 target nucleic acids are NOT DETECTED.  The SARS-CoV-2 RNA is generally detectable in upper respiratoy specimens during the acute phase of infection. The lowest concentration of SARS-CoV-2 viral copies this assay can detect is 131 copies/mL. A negative result does not preclude SARS-Cov-2 infection and should not be used as the sole basis for treatment or other patient management decisions. A negative result may occur with  improper specimen collection/handling, submission of specimen other than nasopharyngeal swab, presence of viral mutation(s) within the areas targeted by this assay, and inadequate number of viral copies (<131 copies/mL). A negative result must be combined with clinical observations, patient history, and epidemiological information. The expected result is Negative.  Fact Sheet for Patients:  06/30/20  Fact Sheet for Healthcare Providers:  https://www.moore.com/  This test is no t yet approved or cleared by the https://www.young.biz/ FDA and  has been authorized for detection and/or diagnosis of SARS-CoV-2 by FDA under an Emergency Use Authorization (EUA). This EUA will remain  in effect (meaning this test can be used) for the duration of the COVID-19 declaration under Section 564(b)(1) of the Act, 21 U.S.C. section 360bbb-3(b)(1), unless the authorization is terminated or revoked sooner.     Influenza A by PCR NEGATIVE NEGATIVE Final   Influenza B by PCR NEGATIVE NEGATIVE Final    Comment: (NOTE) The Xpert Xpress SARS-CoV-2/FLU/RSV assay is intended as an aid in  the diagnosis of influenza from Nasopharyngeal swab specimens and  should not be used as a sole basis for treatment. Nasal washings and   aspirates are unacceptable for Xpert Xpress SARS-CoV-2/FLU/RSV  testing.  Fact Sheet for Patients: [place]  Fact Sheet for Healthcare Providers: https://www.moore.com/  This test is not yet approved or cleared by the https://www.young.biz/ FDA and  has been authorized for detection and/or diagnosis of SARS-CoV-2 by  FDA under an Emergency Use Authorization (EUA). This EUA will remain  in effect (meaning this test can be used) for the duration of the  Covid-19 declaration under Section 564(b)(1) of the Act, 21  U.S.C. section 360bbb-3(b)(1), unless the authorization is  terminated or revoked. Performed at [facility], [address], [place], [place] [address]       Studies: CT ABDOMEN PELVIS W CONTRAST  Result Date: 06/30/2020 CLINICAL DATA:  Anemia. EXAM: CT ABDOMEN AND PELVIS WITH CONTRAST TECHNIQUE: Multidetector CT imaging of the abdomen and pelvis was performed using the standard protocol following bolus administration of intravenous contrast. CONTRAST:  OMNIPAQUE IOHEXOL 300 MG/ML  SOLN COMPARISON:  None. FINDINGS: Lower chest: Centrilobular emphysematous changes are present. Linear scarring or atelectasis is present at both bases. No significant consolidation is present. Focal thickening is present along the minor fissure. This could represent a small amount of fluid. No other significant effusion is present. The heart size is normal. Hepatobiliary: No focal liver abnormality is seen. No gallstones, gallbladder wall thickening, or biliary dilatation. Pancreas: Unremarkable. No pancreatic ductal dilatation or surrounding inflammatory changes. Spleen: Normal in size without focal abnormality. Adrenals/Urinary Tract: The adrenal glands are normal bilaterally. Simple cyst at the lower pole of the right kidney measures up to 3.5 cm. 4 cm cyst is present near the upper pole of the right kidney. Ureters are within normal limits  bilaterally. Marked wall thickening present circumferentially in the urinary bladder. Right-sided diverticulum is present. Stomach/Bowel: Stomach and duodenum are within normal limits. Small bowel is unremarkable. Terminal ileum is within limits. The ascending transverse colon are within limits. Descending and sigmoid colon are within as well. Vascular/Lymphatic: Extensive vascular calcifications are present. No aneurysm is present. No significant adenopathy present. Reproductive: Prostate is unremarkable. Other: No abdominal wall hernia or abnormality. No abdominopelvic ascites. Musculoskeletal: Vertebral body heights and alignment are. No lytic or blastic lesions are present. Bony pelvis is within normal limits. IMPRESSION: 1. Marked wall thickening in the urinary bladder suggesting cystitis or urinary tract infection. 2. No other acute or focal lesion to explain the patient's anemia. No retroperitoneal hematoma. 3. Aortic Atherosclerosis (ICD10-I70.0) and Emphysema (ICD10-J43.9). Electronically Signed   By: Marin Roberts [name]   On: 06/30/2020 20:46    Scheduled Meds: . DULoxetine  30 mg Oral QHS  . enoxaparin (LOVENOX) injection  30 mg Subcutaneous Q24H  . feeding supplement  1 Container Oral TID BM  . ferrous sulfate  325 mg Oral TID WC  . gabapentin  600 mg Oral BID  . latanoprost  1 drop Both Eyes QHS  . levETIRAcetam  750 mg Oral BID  . mirtazapine  7.5 mg Oral Daily  . multivitamin with minerals  1 tablet Oral Daily  . phenytoin  230 mg Oral Daily  . potassium chloride  40 mEq Oral BID  . senna-docusate  2 tablet Oral BID  . sodium chloride flush  3 mL Intravenous Q12H  . zonisamide  150 mg Oral Daily    Continuous Infusions: . sodium chloride       LOS: 2 days     Darlin Drop, MD [facility] Pager [phone]  If 7PM-7AM, please contact night-coverage [email or web address] Password [facility] 07/01/2020, 2:37 PM

## 2020-07-01 NOTE — Progress Notes (Signed)
Report given to Jaquelyn Bitter, Charity fundraiser at [facility].

## 2020-07-01 NOTE — Plan of Care (Signed)
Problem: Education: Goal: Knowledge of General Education information will improve Description: Including pain rating scale, medication(s)/side effects and non-pharmacologic comfort measures 07/01/2020 1546 by Durward Fortes, RN Outcome: Adequate for Discharge 07/01/2020 1546 by Durward Fortes, RN Outcome: Adequate for Discharge 07/01/2020 0730 by Durward Fortes, RN Outcome: Progressing   Problem: Health Behavior/Discharge Planning: Goal: Ability to manage health-related needs will improve 07/01/2020 1546 by Durward Fortes, RN Outcome: Adequate for Discharge 07/01/2020 1546 by Durward Fortes, RN Outcome: Adequate for Discharge 07/01/2020 0730 by Durward Fortes, RN Outcome: Progressing   Problem: Clinical Measurements: Goal: Ability to maintain clinical measurements within normal limits will improve 07/01/2020 1546 by Durward Fortes, RN Outcome: Adequate for Discharge 07/01/2020 1546 by Durward Fortes, RN Outcome: Adequate for Discharge 07/01/2020 0730 by Durward Fortes, RN Outcome: Progressing Goal: Will remain free from infection 07/01/2020 1546 by Durward Fortes, RN Outcome: Adequate for Discharge 07/01/2020 1546 by Durward Fortes, RN Outcome: Adequate for Discharge 07/01/2020 0730 by Durward Fortes, RN Outcome: Progressing Goal: Diagnostic test results will improve 07/01/2020 1546 by Durward Fortes, RN Outcome: Adequate for Discharge 07/01/2020 1546 by Durward Fortes, RN Outcome: Adequate for Discharge 07/01/2020 0730 by Durward Fortes, RN Outcome: Progressing Goal: Respiratory complications will improve 07/01/2020 1546 by Durward Fortes, RN Outcome: Adequate for Discharge 07/01/2020 1546 by Durward Fortes, RN Outcome: Adequate for Discharge 07/01/2020 0730 by Durward Fortes, RN Outcome: Progressing Goal: Cardiovascular complication will be avoided 07/01/2020 1546 by Durward Fortes, RN Outcome: Adequate for Discharge 07/01/2020 1546 by Durward Fortes,  RN Outcome: Adequate for Discharge 07/01/2020 0730 by Durward Fortes, RN Outcome: Progressing   Problem: Activity: Goal: Risk for activity intolerance will decrease 07/01/2020 1546 by Durward Fortes, RN Outcome: Adequate for Discharge 07/01/2020 1546 by Durward Fortes, RN Outcome: Adequate for Discharge 07/01/2020 0730 by Durward Fortes, RN Outcome: Progressing   Problem: Nutrition: Goal: Adequate nutrition will be maintained 07/01/2020 1546 by Durward Fortes, RN Outcome: Adequate for Discharge 07/01/2020 1546 by Durward Fortes, RN Outcome: Adequate for Discharge 07/01/2020 0730 by Durward Fortes, RN Outcome: Progressing   Problem: Coping: Goal: Level of anxiety will decrease 07/01/2020 1546 by Durward Fortes, RN Outcome: Adequate for Discharge 07/01/2020 1546 by Durward Fortes, RN Outcome: Adequate for Discharge 07/01/2020 0730 by Durward Fortes, RN Outcome: Progressing   Problem: Elimination: Goal: Will not experience complications related to bowel motility 07/01/2020 1546 by Durward Fortes, RN Outcome: Adequate for Discharge 07/01/2020 1546 by Durward Fortes, RN Outcome: Adequate for Discharge 07/01/2020 0730 by Durward Fortes, RN Outcome: Progressing Goal: Will not experience complications related to urinary retention 07/01/2020 1546 by Durward Fortes, RN Outcome: Adequate for Discharge 07/01/2020 1546 by Durward Fortes, RN Outcome: Adequate for Discharge 07/01/2020 0730 by Durward Fortes, RN Outcome: Progressing   Problem: Pain Managment: Goal: General experience of comfort will improve 07/01/2020 1546 by Durward Fortes, RN Outcome: Adequate for Discharge 07/01/2020 1546 by Durward Fortes, RN Outcome: Adequate for Discharge 07/01/2020 0730 by Durward Fortes, RN Outcome: Progressing   Problem: Safety: Goal: Ability to remain free from injury will improve 07/01/2020 1546 by Durward Fortes, RN Outcome: Adequate for Discharge 07/01/2020 1546 by Durward Fortes, RN Outcome: Adequate for Discharge 07/01/2020 0730 by Durward Fortes, RN Outcome: Progressing   Problem: Skin Integrity: Goal: Risk for impaired skin integrity will  decrease 07/01/2020 1546 by Durward Fortes, RN Outcome: Adequate for Discharge 07/01/2020 1546 by Durward Fortes, RN Outcome: Adequate for Discharge 07/01/2020 0730 by Durward Fortes, RN Outcome: Progressing

## 2020-07-01 NOTE — Evaluation (Signed)
Physical Therapy Evaluation Patient Details Name: [name] MRN: [id number] DOB: March 24, 1946 Today's Date: 07/01/2020   History of Present Illness  Pt adm from [facility] after a fall. Found to be anemic. PMH - CVA, dementia, craniotomy, HTN, SDH, seizures, polysubstance abuse.   Clinical Impression  Pt admitted with above diagnosis and presents to PT with functional limitations due to deficits listed below (See PT problem list). Pt needs skilled PT to maximize independence and safety to allow discharge back to ALF.      Follow Up Recommendations Other (comment) (Return to ALF)    Equipment Recommendations  None recommended by PT    Recommendations for Other Services       Precautions / Restrictions Precautions Precautions: Fall      Mobility  Bed Mobility Overal bed mobility: Needs Assistance Bed Mobility: Supine to Sit     Supine to sit: Min assist     General bed mobility comments: Assist to elevate trunk into sitting    Transfers Overall transfer level: Needs assistance Equipment used: 1 person hand held assist Transfers: Sit to/from [employer] Sit to Stand: Min assist Stand pivot transfers: Mod assist       General transfer comment: Pt using LUE to hold my forearm. Assist to bring hips up and for balance. Assist for pivot with RLE with poor stability  Ambulation/Gait             General Gait Details: Unable  Stairs            Wheelchair Mobility    Modified Rankin (Stroke Patients Only)       Balance Overall balance assessment: Needs assistance Sitting-balance support: No upper extremity supported;Feet supported Sitting balance-Leahy Scale: Fair     Standing balance support: Single extremity supported Standing balance-Leahy Scale: Poor Standing balance comment: Static standing with min to mod assist with pt using my forearm for support                             Pertinent Vitals/Pain Pain  Assessment: No/denies pain    Home Living Family/patient expects to be discharged to:: Skilled nursing facility                      Prior Function Level of Independence: Needs assistance   Gait / Transfers Assistance Needed: Pt w/c bound. Reports he needs assist for transfers at times.           Hand Dominance        Extremity/Trunk Assessment   Upper Extremity Assessment Upper Extremity Assessment: RUE deficits/detail RUE Deficits / Details: Significant weakness due to old CVA    Lower Extremity Assessment Lower Extremity Assessment: Generalized weakness;RLE deficits/detail RLE Deficits / Details: Significant weakness due to old CVA       Communication   Communication: Expressive difficulties  Cognition Arousal/Alertness: Awake/alert Behavior During Therapy: WFL for tasks assessed/performed Overall Cognitive Status: Difficult to assess                                 General Comments: Pt following multistep commands and likely pt at baseline.       General Comments      Exercises     Assessment/Plan    PT Assessment Patient needs continued PT services  PT Problem List Decreased strength;Decreased balance;Decreased mobility  PT Treatment Interventions Functional mobility training;Therapeutic activities;Therapeutic exercise;DME instruction;Balance training;Patient/family education    PT Goals (Current goals can be found in the Care Plan section)  Acute Rehab PT Goals Patient Stated Goal: get OOB PT Goal Formulation: With patient Time For Goal Achievement: 07/15/20 Potential to Achieve Goals: Fair    Frequency Min 2X/week   Barriers to discharge        Co-evaluation               AM-PAC PT "6 Clicks" Mobility  Outcome Measure Help needed turning from your back to your side while in a flat bed without using bedrails?: A Little Help needed moving from lying on your back to sitting on the side of a flat bed  without using bedrails?: A Little Help needed moving to and from a bed to a chair (including a wheelchair)?: A Lot Help needed standing up from a chair using your arms (e.g., wheelchair or bedside chair)?: A Little Help needed to walk in hospital room?: Total Help needed climbing 3-5 steps with a railing? : Total 6 Click Score: 13    End of Session Equipment Utilized During Treatment: Gait belt Activity Tolerance: Patient tolerated treatment well Patient left: in chair;with call bell/phone within reach Nurse Communication: Mobility status PT Visit Diagnosis: Other abnormalities of gait and mobility (R26.89);Difficulty in walking, not elsewhere classified (R26.2)    Time: 1047-1110 PT Time Calculation (min) (ACUTE ONLY): 23 min   Charges:   PT Evaluation $PT Eval Moderate Complexity: 1 Mod PT Treatments $Therapeutic Activity: 8-22 mins        [facility] PT Acute Rehabilitation Services Pager [phone] Office [phone]   Angelina Ok [facility] 07/01/2020, 2:48 PM

## 2020-07-01 NOTE — Progress Notes (Signed)
Phenytoin Initial Consult Indication: history of seizures  Allergies  Allergen Reactions  . Carbamazepine Other (See Comments)    As noted on MAR; no specific reaction noted  . Tricyclic Antidepressants Other (See Comments)    As noted on MAR; no specific reaction noted    Patient Measurements: Height: 5\' 5"  (165.1 cm) Weight: 43.2 kg (95 lb 3.8 oz) IBW/kg (Calculated) : 61.5 TPN AdjBW (KG): 54.4 Body mass index is 15.85 kg/m.   Vital signs: Temp: 98.2 F (36.8 C) (11/26 0500) Temp Source: Oral (11/26 0500) BP: 118/51 (11/26 0500) Pulse Rate: 72 (11/26 0500)  Labs: Lab Results  Component Value Date/Time   Albumin 2.6 (L) 07/01/2020 0217   Phenytoin Lvl 8.6 (L) 07/01/2020 0217   Lab Results  Component Value Date   PHENYTOIN 8.6 (L) 07/01/2020   Estimated Creatinine Clearance: 51 mL/min (A) (by C-G formula based on SCr of 0.53 mg/dL (L)).   Assessment: 74 y.o. male admitted for a fall, on phenytoin prior to admission for history of seizures. Phenytoin level found to be supratherapeutic on admission. Patient with no seizure activity, no signs/symptoms of phenytoin toxicity.   11/23 PHT= 26.3; corrected (using albumin of 2.7)= 41 11/24 PHT= 22.7 (alb= 2.7); corrected= 35 11/26 PHT= 8.6 (alb= 2.6); corr= 13.8  Goals of care:  Total phenytoin level: 10-20 mcg/ml Free phenytoin level: 1-2 mcg/ml  Plan:  Restart phenytoin at 230mg /day Consider a phenytoin level in 5-7 days  12/26, PharmD Clinical Pharmacist **Pharmacist phone directory can now be found on [email or web address] ([email or web address]).  Listed under [facility].   Harland German

## 2020-07-01 NOTE — Plan of Care (Signed)

## 2020-07-01 NOTE — TOC Transition Note (Addendum)
Transition of Care [facility]) - CM/SW Discharge Note   Patient Details  Name: [name] MRN: [id number] Date of Birth: 06-05-1946  Transition [facility]) CM/SW Contact:  Levada Schilling Phone Number: 07/01/2020, 4:25 PM   Clinical Narrative:    Patient will Discharge To: [facility] Anticipated DC Date:07/01/20 Family Notified: Left voice message for sister to return phone call, [name], [phone] Transport By: Sharin Mons   Per MD patient ready for DC to [facility] . RN, patient, patient's family, and facility notified of DC. Assessment, Fl2/Pasrr, and Discharge Summary sent to facility. RN given number for report [phone]).  After 5:00pm ask for [place], [employer]. DC packet on chart. Ambulance transport requested for patient.   CSW signing off.  Budd Palmer LCSWA [phone]     Final next level of care: Skilled Nursing Facility Barriers to Discharge: No Barriers Identified   Patient Goals and CMS Choice        Discharge Placement              Patient chooses bed at:  [facility]) Patient to be transferred to facility by: [facility] Name of family member notified: [name], sister Patient and family notified of of transfer: 07/01/20  Discharge Plan and Services                                     Social Determinants of Health (SDOH) Interventions     Readmission Risk Interventions No flowsheet data found.

## 2020-07-01 NOTE — Discharge Summary (Addendum)
Discharge Summary  SABASTIEN TYLER HGD:[id number] DOB: 03-15-1946  PCP: Herma Mering, NP  Admit date: 06/28/2020 Discharge date: 07/01/2020  Time spent: 35 minutes  Recommendations for Outpatient Follow-up:  Follow-up with GI in 1 to 2 weeks Follow-up with your PCP within a week Obtain referral to hematology from your PCP Repeat BMP, CBC and phenytoin level on 07/06/2020.  Discharge Diagnoses:  Active Hospital Problems   Diagnosis Date Noted  . Microcytic anemia 10/22/2016  . Syncope 06/28/2020  . Pressure injury of skin 06/28/2020  . Essential hypertension 10/22/2016  . 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 Seizure disorder (HCC) 05/20/2013  . Hemiparesis affecting dominant side as late effect of stroke (HCC) 05/20/2013    Resolved Hospital Problems  No resolved problems to display.    Discharge Condition: Stable  Diet recommendation: Resume previous diet  Vitals:   07/01/20 0500 07/01/20 1336  BP: (!) 118/51 117/64  Pulse: 72 86  Resp: 16 15  Temp: 98.2 F (36.8 C) 97.6 F (36.4 C)  SpO2: 97% 98%    History of present illness:  Seferino R Richardsonis a 74 y.o.malewith Past medical history ofpast medical history of seizure disorder, hypertension, CVA with right-sided weakness, wheelchair-bound at baseline presents with a fall and eye injury. Patient is from SNF while he was trying to get out of his wheelchair yesterday to get into the bed the floor was slippery and had a fall. He stayed on the ground for almost 40 minutes before his roommate came in to help him. He denies having any head injury or neck injury denies having any loss of consciousness. When RN at the facility went to see him found that he had some bruises on his right side of the face and further he needs to be evaluated by the ER. At the time of my evaluation in the ER patient denies having any complaints of headache, dizziness, chest pain, shortness of breath, fever, chills. Also denies any nausea  or vomiting. Denies any diarrhea. Denies any blood in the stool. Denies any change in the medication. Takes aspirin but denies any ibuprofen Aleve naproxen medicines like that. Denies any prior history of upper GI endoscopy as well. Denies any heartburn or acid reflux. Reportedly patient was having soft blood pressure when the EMS found him.  07/01/20: Seen and examined.  He has no new complaints.  Seen by GI, no plan for EGD or colonoscopy during this admission.  Will follow up with GI and hematology outpatient.  CT abdomen and pelvis completed, no evidence of GI malignancy or lesion by CT scan.  GI signed off.      Hospital Course:  Principal Problem:   Microcytic anemia Active Problems:   Seizure disorder (HCC)   Hemiparesis affecting dominant side as late effect of stroke (HCC)   Gait disorder   Essential hypertension   Syncope   Pressure injury of skin  Chronic microcytic anemia/iron deficiency anemia Patient presents with generalized weakness and fatigue and a fall. Hemoglobin 7.5> 7.8.>7.2, negative FOBT, no evidence of GI malignancy or lesion on CT scan done on 06/30/2020.   Was seen by GI, signed off on 07/01/2020.  Recommended to follow-up outpatient with Dr. Bosie Clos. Obtain hematology referral from PCP. Continue iron supplement Repeat CBC on 07/06/2020 at PCPs office  Seizure disorder. On phenytoin and Keppra. Phenytoin level elevated 22.7 on 06-29-20 Repeat phenytoin level 8.6 on 07-01-20, restarted phenytoin at 230 mg daily. Repeat phenytoin level on 07/06/2020 at PCPs office Continue home Keppra Continue  seizure precautions  Unwitnessed fall. Etiology of the fall is not clear but per patient's history it appears to be a mechanical fall on a slippery floor. CT head and CT cervical spine negative for any acute abnormality. Continue fall precautions  Presumed cystitis on CT scan (06/30/20) Asymptomatic, afebrile with no leukocytosis Received 1 dose of  fosfomycin on 07/01/20 Follow up with PCP  CT scan abd/pelvis with contrast done on 06/30/20: 1. Marked wall thickening in the urinary bladder suggesting cystitis or urinary tract infection. 2. No other acute or focal lesion to explain the patient's anemia. No retroperitoneal hematoma. 3. Aortic Atherosclerosis (ICD10-I70.0) and Emphysema (ICD10-J43.9).  Mood disorder/chronic anxiety/depression. Resume home regimen.  Severe protein calorie malnutrition  Albumin 2.7 BMI 15 Continue to encourage increase in oral protein calorie intake. Oral supplement as tolerated.  Essential hypertension. Last BP 117/64 with heart rate of 86 Continue to hold off home lisinopril Follow-up with your PCP  Improving generalized weakness likely multifactorial secondary to poor oral intake and symptomatic anemia PT assessment recommended return to assisted living facility. Continue fall precautions. TOC assisting with return to facility.  Refractory Hypokalemia Potassium 3.1 from 3.7 from 3.4, repleted orally Continue oral potassium supplement 20 mEq daily x7 days. Repeat BMP on 07/06/2020 at PCPs office.  Chronic constipation Continue Senokot 1 tablet nightly  Pressure injuries of coccyx and left foot, both stage 2, POA Local wound care     Consults:GI.   Disposition: From: Assisted living facility.     Code Status: Full code   Consultants:  GI  Procedures:  None  No plan for EGD or colonoscopy inpatient per GI.    Discharge Exam: BP 117/64 (BP Location: Left Arm)   Pulse 86   Temp 97.6 F (36.4 C) (Oral)   Resp 15   Ht  (1.651 m)   Wt 43.2 kg   SpO2 98%   BMI 15.85 kg/m  . General: 74 y.o. year-old male Thin built, in no acute distress.  Alert and interactive. . Cardiovascular: Regular rate and rhythm with no rubs or gallops.  No thyromegaly or JVD noted.   Marland Kitchen Respiratory: Clear to auscultation with no wheezes or rales. Good inspiratory  effort. . Abdomen: Soft nontender nondistended with normal bowel sounds x4 quadrants. . Musculoskeletal: No lower extremity edema bilaterally.   Marland Kitchen Psychiatry: Mood is appropriate for condition and setting  Discharge Instructions You were cared for by a hospitalist during your hospital stay. If you have any questions about your discharge medications or the care you received while you were in the hospital after you are discharged, you can call the unit and asked to speak with the hospitalist on call if the hospitalist that took care of you is not available. Once you are discharged, your primary care physician will handle any further medical issues. Please note that NO REFILLS for any discharge medications will be authorized once you are discharged, as it is imperative that you return to your primary care physician (or establish a relationship with a primary care physician if you do not have one) for your aftercare needs so that they can reassess your need for medications and monitor your lab values.   Allergies as of 07/01/2020      Reactions   Carbamazepine Other (See Comments)   As noted on MAR; no specific reaction noted   Tricyclic Antidepressants Other (See Comments)   As noted on MAR; no specific reaction noted      Medication List  STOP taking these medications   lisinopril 10 MG tablet Commonly known as: ZESTRIL     TAKE these medications   acetaminophen 325 MG tablet Commonly known as: TYLENOL Take 650 mg by mouth every morning. AND MAY TAKE AN ADDITIONAL 650 MG EVERY 8 HOURS AS NEEDED FOR PAIN   Aquaphor Advanced Therapy Oint Apply 1 application topically daily. Spread topically to bilateral lower extremities once daily   aspirin EC 81 MG tablet Take 81 mg by mouth daily.   DULoxetine 30 MG capsule Commonly known as: CYMBALTA Take 30 mg by mouth at bedtime.   ferrous sulfate 325 (65 FE) MG tablet Take 1 tablet (325 mg total) by mouth daily with breakfast.     gabapentin 600 MG tablet Commonly known as: NEURONTIN Take 600 mg by mouth 2 (two) times daily.   latanoprost 0.005 % ophthalmic solution Commonly known as: XALATAN Place 1 drop into both eyes at bedtime.   levETIRAcetam 750 MG tablet Commonly known as: KEPPRA Take 750 mg by mouth 2 (two) times daily.   Menthol (Topical Analgesic) 4 % Gel Apply 1 application topically in the morning and at bedtime. Apply to both shoulders   mirtazapine 7.5 MG tablet Commonly known as: REMERON Take 7.5 mg by mouth daily.   phenytoin 30 MG ER capsule Commonly known as: DILANTIN Take 8 capsules (240 mg total) by mouth daily. Start taking on: July 02, 2020 What changed:   medication strength  how much to take  when to take this   polyethylene glycol 17 g packet Commonly known as: MIRALAX / GLYCOLAX Take 17 g by mouth daily as needed for mild constipation.   potassium chloride SA 20 MEQ tablet Commonly known as: KLOR-CON Take 1 tablet (20 mEq total) by mouth daily for 7 days.   senna-docusate 8.6-50 MG tablet Commonly known as: Senokot-S Take 1 tablet by mouth at bedtime.   Tab-A-Vite Tabs Take 1 tablet by mouth every morning.   UNABLE TO FIND "Mighty Shake": Drink 1 shake by mouth three times a day with meals   Vitamin D3 10 MCG (400 UNIT) tablet Take 800 Units by mouth daily.   zonisamide 50 MG capsule Commonly known as: ZONEGRAN Take 3 capsules (150 mg total) by mouth daily. What changed:   how much to take  when to take this      Allergies  Allergen Reactions  . Carbamazepine Other (See Comments)    As noted on MAR; no specific reaction noted  . Tricyclic Antidepressants Other (See Comments)    As noted on MAR; no specific reaction noted    Follow-up Information    Herma Mering, NP. Call in 1 day(s).   Specialty: Nurse Practitioner Why: please call for a post hospital follow up appointment Contact information: [address] [place]  [address] [phone]        Charlott Rakes, MD. Call in 1 day(s).   Specialty: Gastroenterology Why: please call for post hospital follow up appointment Contact information: [address] [place] [address] [phone]                The results of significant diagnostics from this hospitalization (including imaging, microbiology, ancillary and laboratory) are listed below for reference.    Significant Diagnostic Studies: CT HEAD WO CONTRAST  Result Date: 06/28/2020 CLINICAL DATA:  Facial trauma. Additional history provided: Patient reports fall yesterday striking right forehead, right eye ecchymosis. EXAM: CT HEAD WITHOUT CONTRAST CT MAXILLOFACIAL WITHOUT CONTRAST TECHNIQUE:  Multidetector CT imaging of the head and maxillofacial structures were performed using the standard protocol without intravenous contrast. Multiplanar CT image reconstructions of the maxillofacial structures were also generated. COMPARISON:  Brain MRI 10/21/2016. Head CT 10/21/2016. Maxillofacial CT 08/01/2006. FINDINGS: CT HEAD FINDINGS Brain: Streak and beam hardening artifact arising from a metallic right cranioplasty limits evaluation. Moderate generalized cerebral and cerebellar atrophy. Mild ill-defined hypoattenuation within the cerebral white matter is nonspecific, but compatible with chronic small vessel ischemic disease. Within described limitations, there is no evidence of acute intracranial hemorrhage, demarcated cortical infarct or extra-axial fluid collection. No evidence of intracranial mass. No midline shift. Vascular: No hyperdense vessel.  Atherosclerotic calcifications. Skull: Right-sided cranioplasty.  No calvarial fracture. CT MAXILLOFACIAL FINDINGS Osseous: No acute maxillofacial fracture is identified. Orbits: Right periorbital soft tissue swelling. Otherwise, there is no acute finding. The globes are normal in size and contour. The extraocular muscles and optic nerve sheath  complexes are symmetric and unremarkable. Sinuses: Trace scattered paranasal sinus mucosal thickening. Soft tissues: Right forehead/periorbital soft tissue swelling. Other: Poor dentition with multiple absent and carious teeth and multifocal periapical lucencies. IMPRESSION: CT head: 1. Examination limited by streak and beam hardening artifact arising from a metallic cranioplasty on the right. 2. Within this limitation, there is no evidence of acute intracranial abnormality. 3. Moderate cerebral and cerebellar atrophy with mild chronic small vessel ischemic disease. CT maxillofacial: 1. No evidence of acute orbital or maxillofacial fracture. 2. Right forehead/periorbital soft tissue swelling. 3. Incidentally noted, there are multiple absent and carious teeth with multifocal periapical lucencies. Electronically Signed   By: Jackey LogeKyle  Golden DO   On: 06/28/2020 15:14   CT ABDOMEN PELVIS W CONTRAST  Result Date: 06/30/2020 CLINICAL DATA:  Anemia. EXAM: CT ABDOMEN AND PELVIS WITH CONTRAST TECHNIQUE: Multidetector CT imaging of the abdomen and pelvis was performed using the standard protocol following bolus administration of intravenous contrast. CONTRAST:  100mL OMNIPAQUE IOHEXOL 300 MG/ML  SOLN COMPARISON:  None. FINDINGS: Lower chest: Centrilobular emphysematous changes are present. Linear scarring or atelectasis is present at both bases. No significant consolidation is present. Focal thickening is present along the minor fissure. This could represent a small amount of fluid. No other significant effusion is present. The heart size is normal. Hepatobiliary: No focal liver abnormality is seen. No gallstones, gallbladder wall thickening, or biliary dilatation. Pancreas: Unremarkable. No pancreatic ductal dilatation or surrounding inflammatory changes. Spleen: Normal in size without focal abnormality. Adrenals/Urinary Tract: The adrenal glands are normal bilaterally. Simple cyst at the lower pole of the right kidney  measures up to 3.5 cm. 4 cm cyst is present near the upper pole of the right kidney. Ureters are within normal limits bilaterally. Marked wall thickening present circumferentially in the urinary bladder. Right-sided diverticulum is present. Stomach/Bowel: Stomach and duodenum are within normal limits. Small bowel is unremarkable. Terminal ileum is within limits. The ascending transverse colon are within limits. Descending and sigmoid colon are within as well. Vascular/Lymphatic: Extensive vascular calcifications are present. No aneurysm is present. No significant adenopathy present. Reproductive: Prostate is unremarkable. Other: No abdominal wall hernia or abnormality. No abdominopelvic ascites. Musculoskeletal: Vertebral body heights and alignment are. No lytic or blastic lesions are present. Bony pelvis is within normal limits. IMPRESSION: 1. Marked wall thickening in the urinary bladder suggesting cystitis or urinary tract infection. 2. No other acute or focal lesion to explain the patient's anemia. No retroperitoneal hematoma. 3. Aortic Atherosclerosis (ICD10-I70.0) and Emphysema (ICD10-J43.9). Electronically Signed   By: Virl Sonhristopher  Mattern [name]  On: 06/30/2020 20:46   CT Maxillofacial Wo Contrast  Result Date: 06/28/2020 CLINICAL DATA:  Facial trauma. Additional history provided: Patient reports fall yesterday striking right forehead, right eye ecchymosis. EXAM: CT HEAD WITHOUT CONTRAST CT MAXILLOFACIAL WITHOUT CONTRAST TECHNIQUE: Multidetector CT imaging of the head and maxillofacial structures were performed using the standard protocol without intravenous contrast. Multiplanar CT image reconstructions of the maxillofacial structures were also generated. COMPARISON:  Brain MRI 10/21/2016. Head CT 10/21/2016. Maxillofacial CT 08/01/2006. FINDINGS: CT HEAD FINDINGS Brain: Streak and beam hardening artifact arising from a metallic right cranioplasty limits evaluation. Moderate generalized cerebral and  cerebellar atrophy. Mild ill-defined hypoattenuation within the cerebral white matter is nonspecific, but compatible with chronic small vessel ischemic disease. Within described limitations, there is no evidence of acute intracranial hemorrhage, demarcated cortical infarct or extra-axial fluid collection. No evidence of intracranial mass. No midline shift. Vascular: No hyperdense vessel.  Atherosclerotic calcifications. Skull: Right-sided cranioplasty.  No calvarial fracture. CT MAXILLOFACIAL FINDINGS Osseous: No acute maxillofacial fracture is identified. Orbits: Right periorbital soft tissue swelling. Otherwise, there is no acute finding. The globes are normal in size and contour. The extraocular muscles and optic nerve sheath complexes are symmetric and unremarkable. Sinuses: Trace scattered paranasal sinus mucosal thickening. Soft tissues: Right forehead/periorbital soft tissue swelling. Other: Poor dentition with multiple absent and carious teeth and multifocal periapical lucencies. IMPRESSION: CT head: 1. Examination limited by streak and beam hardening artifact arising from a metallic cranioplasty on the right. 2. Within this limitation, there is no evidence of acute intracranial abnormality. 3. Moderate cerebral and cerebellar atrophy with mild chronic small vessel ischemic disease. CT maxillofacial: 1. No evidence of acute orbital or maxillofacial fracture. 2. Right forehead/periorbital soft tissue swelling. 3. Incidentally noted, there are multiple absent and carious teeth with multifocal periapical lucencies. Electronically Signed   By: Jackey Loge DO   On: 06/28/2020 15:14    Microbiology: Recent Results (from the past 240 hour(s))  Respiratory Panel by RT PCR (Flu A&B, Covid) - Nasopharyngeal Swab     Status: None   Collection Time: 06/28/20  3:10 PM   Specimen: Nasopharyngeal Swab; Nasopharyngeal(NP) swabs in vial transport medium  Result Value Ref Range Status   SARS Coronavirus 2 by RT PCR  NEGATIVE NEGATIVE Final    Comment: (NOTE) SARS-CoV-2 target nucleic acids are NOT DETECTED.  The SARS-CoV-2 RNA is generally detectable in upper respiratoy specimens during the acute phase of infection. The lowest concentration of SARS-CoV-2 viral copies this assay can detect is 131 copies/mL. A negative result does not preclude SARS-Cov-2 infection and should not be used as the sole basis for treatment or other patient management decisions. A negative result may occur with  improper specimen collection/handling, submission of specimen other than nasopharyngeal swab, presence of viral mutation(s) within the areas targeted by this assay, and inadequate number of viral copies (<131 copies/mL). A negative result must be combined with clinical observations, patient history, and epidemiological information. The expected result is Negative.  Fact Sheet for Patients:  https://www.moore.com/  Fact Sheet for Healthcare Providers:  https://www.young.biz/  This test is no t yet approved or cleared by the [place] FDA and  has been authorized for detection and/or diagnosis of SARS-CoV-2 by FDA under an Emergency Use Authorization (EUA). This EUA will remain  in effect (meaning this test can be used) for the duration of the COVID-19 declaration under Section 564(b)(1) of the Act, 21 U.S.C. section 360bbb-3(b)(1), unless the authorization is terminated or revoked sooner.  Influenza A by PCR NEGATIVE NEGATIVE Final   Influenza B by PCR NEGATIVE NEGATIVE Final    Comment: (NOTE) The Xpert Xpress SARS-CoV-2/FLU/RSV assay is intended as an aid in  the diagnosis of influenza from Nasopharyngeal swab specimens and  should not be used as a sole basis for treatment. Nasal washings and  aspirates are unacceptable for Xpert Xpress SARS-CoV-2/FLU/RSV  testing.  Fact Sheet for Patients: https://www.moore.com/  Fact Sheet for  Healthcare Providers: https://www.young.biz/  This test is not yet approved or cleared by the [place] FDA and  has been authorized for detection and/or diagnosis of SARS-CoV-2 by  FDA under an Emergency Use Authorization (EUA). This EUA will remain  in effect (meaning this test can be used) for the duration of the  Covid-19 declaration under Section 564(b)(1) of the Act, 21  U.S.C. section 360bbb-3(b)(1), unless the authorization is  terminated or revoked. Performed at [facility], [address], [place], [place] [address]      Labs: Basic Metabolic Panel: Recent Labs  Lab 06/28/20 1305 06/29/20 0209 06/30/20 0906 07/01/20 0217  NA 139 138 136 135  K 3.6 3.4* 3.7 3.1*  CL 110 107 107 107  CO2 20* 22 21* 21*  GLUCOSE 103* 93 94 133*  BUN 19 17 11 8   CREATININE 0.81 0.70 0.69 0.53*  CALCIUM 8.6* 8.5* 8.4* 8.3*  MG  --   --   --  1.7   Liver Function Tests: Recent Labs  Lab 06/29/20 0209 07/01/20 0217  AST 24  --   ALT 21  --   ALKPHOS 124  --   BILITOT 0.4  --   PROT 6.5  --   ALBUMIN 2.7* 2.6*   No results for input(s): LIPASE, AMYLASE in the last 168 hours. No results for input(s): AMMONIA in the last 168 hours. CBC: Recent Labs  Lab 06/28/20 1305 06/28/20 1905 06/29/20 0209 06/30/20 0906 07/01/20 0217  WBC 8.7 8.6 6.4 5.5 6.4  HGB 7.5* 8.1* 7.4* 7.8* 7.2*  HCT 23.2* 24.8* 22.1* 23.4* 21.9*  MCV 72.0* 71.3* 69.9* 70.1* 69.5*  PLT 277 274 232 324 284   Cardiac Enzymes: No results for input(s): CKTOTAL, CKMB, CKMBINDEX, TROPONINI in the last 168 hours. BNP: BNP (last 3 results) No results for input(s): BNP in the last 8760 hours.  ProBNP (last 3 results) No results for input(s): PROBNP in the last 8760 hours.  CBG: Recent Labs  Lab 06/28/20 1313  GLUCAP 101*       Signed:  06/30/20, MD [facility] 07/01/2020, 3:44 PM

## 2021-12-06 ENCOUNTER — Inpatient Hospital Stay (HOSPITAL_COMMUNITY)
Admission: EM | Admit: 2021-12-06 | Discharge: 2021-12-11 | DRG: 091 | Disposition: A | Discharge status: Skilled Nursing Facility | Source: Skilled Nursing Facility | Attending: Internal Medicine | Admitting: Internal Medicine

## 2021-12-06 ENCOUNTER — Emergency Department (HOSPITAL_COMMUNITY)

## 2021-12-06 ENCOUNTER — Other Ambulatory Visit: Payer: Self-pay

## 2021-12-06 ENCOUNTER — Encounter (HOSPITAL_COMMUNITY): Payer: Self-pay

## 2021-12-06 DIAGNOSIS — Z79899 Other long term (current) drug therapy: Secondary | ICD-10-CM

## 2021-12-06 DIAGNOSIS — Z7982 Long term (current) use of aspirin: Secondary | ICD-10-CM

## 2021-12-06 DIAGNOSIS — G928 Other toxic encephalopathy: Secondary | ICD-10-CM | POA: Diagnosis not present

## 2021-12-06 DIAGNOSIS — G40909 Epilepsy, unspecified, not intractable, without status epilepticus: Secondary | ICD-10-CM | POA: Diagnosis present

## 2021-12-06 DIAGNOSIS — Z681 Body mass index (BMI) 19 or less, adult: Secondary | ICD-10-CM

## 2021-12-06 DIAGNOSIS — G9341 Metabolic encephalopathy: Secondary | ICD-10-CM | POA: Diagnosis present

## 2021-12-06 DIAGNOSIS — Z23 Encounter for immunization: Secondary | ICD-10-CM

## 2021-12-06 DIAGNOSIS — D649 Anemia, unspecified: Secondary | ICD-10-CM

## 2021-12-06 DIAGNOSIS — E860 Dehydration: Secondary | ICD-10-CM | POA: Diagnosis present

## 2021-12-06 DIAGNOSIS — I69351 Hemiplegia and hemiparesis following cerebral infarction affecting right dominant side: Secondary | ICD-10-CM

## 2021-12-06 DIAGNOSIS — T420X5A Adverse effect of hydantoin derivatives, initial encounter: Secondary | ICD-10-CM | POA: Diagnosis present

## 2021-12-06 DIAGNOSIS — D509 Iron deficiency anemia, unspecified: Secondary | ICD-10-CM | POA: Diagnosis present

## 2021-12-06 DIAGNOSIS — T420X1A Poisoning by hydantoin derivatives, accidental (unintentional), initial encounter: Principal | ICD-10-CM

## 2021-12-06 DIAGNOSIS — I10 Essential (primary) hypertension: Secondary | ICD-10-CM | POA: Diagnosis present

## 2021-12-06 DIAGNOSIS — R64 Cachexia: Secondary | ICD-10-CM | POA: Diagnosis present

## 2021-12-06 DIAGNOSIS — W19XXXA Unspecified fall, initial encounter: Secondary | ICD-10-CM

## 2021-12-06 DIAGNOSIS — E43 Unspecified severe protein-calorie malnutrition: Secondary | ICD-10-CM | POA: Diagnosis present

## 2021-12-06 LAB — COMPREHENSIVE METABOLIC PANEL
ALT: 16 U/L (ref 0–44)
AST: 34 U/L (ref 15–41)
Albumin: 3.4 g/dL — ABNORMAL LOW (ref 3.5–5.0)
Alkaline Phosphatase: 127 U/L — ABNORMAL HIGH (ref 38–126)
Anion gap: 8 (ref 5–15)
BUN: 22 mg/dL (ref 8–23)
CO2: 21 mmol/L — ABNORMAL LOW (ref 22–32)
Calcium: 8.2 mg/dL — ABNORMAL LOW (ref 8.9–10.3)
Chloride: 108 mmol/L (ref 98–111)
Creatinine, Ser: 0.8 mg/dL (ref 0.61–1.24)
GFR, Estimated: >60 mL/min (ref >60)
Glucose, Bld: 87 mg/dL (ref 70–99)
Potassium: 6.2 mmol/L — ABNORMAL HIGH (ref 3.5–5.1)
Sodium: 137 mmol/L (ref 135–145)
Total Bilirubin: 0.1 mg/dL — ABNORMAL LOW (ref 0.3–1.2)
Total Protein: 6.3 g/dL — ABNORMAL LOW (ref 6.5–8.1)

## 2021-12-06 LAB — URINALYSIS, ROUTINE W REFLEX MICROSCOPIC
Bilirubin Urine: NEGATIVE
Glucose, UA: NEGATIVE mg/dL
Ketones, ur: NEGATIVE mg/dL
Nitrite: NEGATIVE
Protein, ur: NEGATIVE mg/dL
Specific Gravity, Urine: 1.029 (ref 1.005–1.030)
WBC, UA: >50 WBC/hpf — ABNORMAL HIGH (ref 0–5)
pH: 5 (ref 5.0–8.0)

## 2021-12-06 LAB — BASIC METABOLIC PANEL
Anion gap: 6 (ref 5–15)
BUN: 18 mg/dL (ref 8–23)
CO2: 21 mmol/L — ABNORMAL LOW (ref 22–32)
Calcium: 7.9 mg/dL — ABNORMAL LOW (ref 8.9–10.3)
Chloride: 111 mmol/L (ref 98–111)
Creatinine, Ser: 0.68 mg/dL (ref 0.61–1.24)
GFR, Estimated: >60 mL/min (ref >60)
Glucose, Bld: 90 mg/dL (ref 70–99)
Potassium: 4.3 mmol/L (ref 3.5–5.1)
Sodium: 138 mmol/L (ref 135–145)

## 2021-12-06 LAB — CBC WITH DIFFERENTIAL/PLATELET
Abs Immature Granulocytes: 0.03 10*3/uL (ref 0.00–0.07)
Basophils Absolute: 0 10*3/uL (ref 0.0–0.1)
Basophils Relative: 0 %
Eosinophils Absolute: 0.1 10*3/uL (ref 0.0–0.5)
Eosinophils Relative: 1 %
HCT: 21.9 % — ABNORMAL LOW (ref 39.0–52.0)
Hemoglobin: 7 g/dL — ABNORMAL LOW (ref 13.0–17.0)
Immature Granulocytes: 0 %
Lymphocytes Relative: 10 %
Lymphs Abs: 1 10*3/uL (ref 0.7–4.0)
MCH: 23.7 pg — ABNORMAL LOW (ref 26.0–34.0)
MCHC: 32 g/dL (ref 30.0–36.0)
MCV: 74.2 fL — ABNORMAL LOW (ref 80.0–100.0)
Monocytes Absolute: 0.9 10*3/uL (ref 0.1–1.0)
Monocytes Relative: 9 %
Neutro Abs: 7.6 10*3/uL (ref 1.7–7.7)
Neutrophils Relative %: 80 %
Platelets: 360 10*3/uL (ref 150–400)
RBC: 2.95 MIL/uL — ABNORMAL LOW (ref 4.22–5.81)
RDW: 17.2 % — ABNORMAL HIGH (ref 11.5–15.5)
WBC: 9.6 10*3/uL (ref 4.0–10.5)
nRBC: 0 % (ref 0.0–0.2)

## 2021-12-06 LAB — ETHANOL: Alcohol, Ethyl (B): <10 mg/dL (ref <10)

## 2021-12-06 LAB — PHENYTOIN LEVEL, TOTAL
Phenytoin Lvl: 33.5 ug/mL (ref 10.0–20.0)
Phenytoin Lvl: 33.9 ug/mL (ref 10.0–20.0)

## 2021-12-06 LAB — VITAMIN B12: Vitamin B-12: 207 pg/mL (ref 180–914)

## 2021-12-06 LAB — IRON AND TIBC
Iron: 23 ug/dL — ABNORMAL LOW (ref 45–182)
Saturation Ratios: 14 % — ABNORMAL LOW (ref 17.9–39.5)
TIBC: 164 ug/dL — ABNORMAL LOW (ref 250–450)
UIBC: 141 ug/dL

## 2021-12-06 LAB — FERRITIN: Ferritin: 1101 ng/mL — ABNORMAL HIGH (ref 24–336)

## 2021-12-06 LAB — PHOSPHORUS: Phosphorus: 3.6 mg/dL (ref 2.5–4.6)

## 2021-12-06 LAB — VITAMIN D 25 HYDROXY (VIT D DEFICIENCY, FRACTURES): Vit D, 25-Hydroxy: 23.54 ng/mL — ABNORMAL LOW (ref 30–100)

## 2021-12-06 LAB — MAGNESIUM: Magnesium: 1.7 mg/dL (ref 1.7–2.4)

## 2021-12-06 MED ORDER — LACTATED RINGERS IV SOLN: INTRAVENOUS | Status: AC

## 2021-12-06 MED ORDER — THIAMINE HCL 100 MG/ML IJ SOLN
100.0000 mg | Freq: Every day | INTRAMUSCULAR | Status: DC
  Administered 2021-12-06 – 2021-12-08 (×3): 100 mg via INTRAVENOUS
  Filled 2021-12-06 (×3): qty 2

## 2021-12-06 MED ORDER — SODIUM CHLORIDE 0.9 % IV BOLUS
1000.0000 mL | Freq: Once | INTRAVENOUS | Status: AC
  Administered 2021-12-06: 1000 mL via INTRAVENOUS

## 2021-12-06 MED ORDER — ENOXAPARIN SODIUM 40 MG/0.4ML IJ SOSY
40.0000 mg | PREFILLED_SYRINGE | INTRAMUSCULAR | Status: DC
  Administered 2021-12-06: 40 mg via SUBCUTANEOUS
  Filled 2021-12-06: qty 0.4

## 2021-12-06 MED ORDER — LEVETIRACETAM 750 MG PO TABS
750.0000 mg | ORAL_TABLET | Freq: Two times a day (BID) | ORAL | Status: DC
  Administered 2021-12-06 – 2021-12-11 (×10): 750 mg via ORAL
  Filled 2021-12-06 (×10): qty 1

## 2021-12-06 MED ORDER — ACETAMINOPHEN 650 MG RE SUPP: 650.0000 mg | Freq: Four times a day (QID) | RECTAL | Status: DC | PRN

## 2021-12-06 MED ORDER — FOLIC ACID 5 MG/ML IJ SOLN
1.0000 mg | Freq: Every day | INTRAMUSCULAR | Status: DC
  Administered 2021-12-06 – 2021-12-08 (×3): 1 mg via INTRAVENOUS
  Filled 2021-12-06 (×3): qty 0.2

## 2021-12-06 MED ORDER — ACETAMINOPHEN 325 MG PO TABS
650.0000 mg | ORAL_TABLET | Freq: Four times a day (QID) | ORAL | Status: DC | PRN
  Filled 2021-12-06: qty 2

## 2021-12-06 MED ORDER — ASPIRIN EC 81 MG PO TBEC
81.0000 mg | DELAYED_RELEASE_TABLET | Freq: Every day | ORAL | Status: DC
Start: 2021-12-07 — End: 2021-12-11
  Administered 2021-12-07 – 2021-12-11 (×5): 81 mg via ORAL
  Filled 2021-12-06 (×5): qty 1

## 2021-12-06 MED ORDER — ONDANSETRON HCL 4 MG/2ML IJ SOLN: 4.0000 mg | Freq: Four times a day (QID) | INTRAMUSCULAR | Status: DC | PRN

## 2021-12-06 MED ORDER — ONDANSETRON HCL 4 MG PO TABS
4.0000 mg | ORAL_TABLET | Freq: Four times a day (QID) | ORAL | Status: DC | PRN
Start: 2021-12-06 — End: 2021-12-11

## 2021-12-06 MED ORDER — LACTATED RINGERS IV BOLUS: 500.0000 mL | Freq: Once | INTRAVENOUS | Status: DC

## 2021-12-06 MED ORDER — DULOXETINE HCL 30 MG PO CPEP
30.0000 mg | ORAL_CAPSULE | Freq: Every day | ORAL | Status: DC
Start: 2021-12-07 — End: 2021-12-11
  Administered 2021-12-07 – 2021-12-11 (×5): 30 mg via ORAL
  Filled 2021-12-06 (×5): qty 1

## 2021-12-06 MED ORDER — IOHEXOL 300 MG/ML  SOLN
80.0000 mL | Freq: Once | INTRAMUSCULAR | Status: AC | PRN
  Administered 2021-12-06: 80 mL via INTRAVENOUS

## 2021-12-06 NOTE — Hospital Course (Addendum)
Acute metabolic encephalopathy  ?Dilantin toxicity ?Poor po intake ?History of seizures ?Severe protein calorie malnutrition ?The patient presents here after sustaining an unwitnessed fall and also presented with symptoms of dizziness and nausea/vomiting.  Review of records shows that he has presented to the ED multiple times in the past with Dilantin toxicity. He also admits to poor p.o. intake in the past few days especially. Unclear exactly how much alcohol the patient drinks.  ?In the ED, his mental status was noted to be at baseline, with some soft blood pressures but pt was otherwise hemodynamically stable. 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 Dilantin levels on admit were noted to be at 33.5.  Potassium was initially noted to be at 6.2 though repeat was 4.3. UA with >50 WBCs, small leukocytes, and many bacteria. Trauma imaging was negative. AMS was thought to be from a combination of possible factors, including dehydration, phenytoin toxicity, possible UTI though denies urinary symptoms so held off on tx with abx. ?Throughout his hospitalization, his phenytoin levels were monitored and supportive care with PRN boluses was provided until his phenytoin levels steadily trended downward. They reached a normal range on ***. Due to his severe protein calorie malnutrition, we consulted RD who recommended Ensure supplementation. Otherwise, his home keppra regimen was continued while his phenytoin was held. ? ?H/o Hypertension ?The patient is on lisinopril 10 mg daily at home for his hypertension. His home lisinopril was held in the setting of lower blood pressures.  ?  ?Microcytic anemia ?Hemoglobin on admit of 7, his hemoglobin was noted to be about 7.5-8 a year ago. No mention of overt bleeding by patient or staff from his facility.  Patient has received iron supplementation in the past. His iron levels during this admission are 23, ferritin is 1100, hemoglobin improved to 7.5 on  hospital day 1. 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 His CBC was trended throughout this admission.  ?  ?Alcohol use ?Unclear exactly how much alcohol the patient drinks, but at one point does state that he drinks about 1/5 of vodka and drinks wine. Also states that he tends to have seizures when he drinks.  He was placed on CIWA without Ativan, and folic acid and thiamine supplementation was given during his hospitalization.  Ethanol levels were unremarkable on admit. ? ? ?Home meds:  ?Aspirin 81 mg daily ?Duloxetine 30 mg daily ?Gabapentin 300 mg BID ?Keppra 750 mg BID ?Lisinopril 10 mg daily ?Phenytoin 300 mg at bedtime ?Trazodone 100 mg at night ?Zonisamide 50 mg 2 tablets BID ? ? ?

## 2021-12-06 NOTE — ED Notes (Signed)
Patient transported to CT 

## 2021-12-06 NOTE — ED Notes (Signed)
Phenytoin 33.5. MD made aware.  ?

## 2021-12-06 NOTE — Progress Notes (Signed)
Civil engineer, contracting [facility]) Hospital Liaison note.    ? ?This is a current [facility] hospice patient with a diagnosis of Severe protein calorie malnutrition. He is a full code per [facility] records.  ? ?ACC liaison will follow for disposition and coordination of care. ? ?Please do not hesitate to call with questions.   ?Thank you,   ?Elsie Saas, RN, CCM      ?[facility] Hospital Liaison   ?[phone] ?

## 2021-12-06 NOTE — ED Notes (Signed)
Phenytoin 33.9. MD made aware . ?

## 2021-12-06 NOTE — H&P (Addendum)
? ? ? ?Date: 12/06/2021     ?     ?     ?Patient Name:  [name] MRN: [id number]  ?DOB: February 08, 1946 Age / Sex: 76 y.o., male   ?PCP: Megan Mans, NP    ?     ?Medical Service: Internal Medicine Teaching Service    ?     ?Attending Physician: Dr. Heber [place], Rachel Moulds, DO    ?First Contact: Dr. Lorin Glass Pager: [phone]  ?Second Contact: Dr. Eulas Post Pager: [phone]  ?     ?After Hours (After 5p/  First Contact Pager: [phone]  ?weekends / holidays): Second Contact Pager: [phone]  ? ?Chief Complaint: fall ? ?History of Present Illness: [name] is a 76 y.o. male with a PMHx of HTN, prior CVA, SDH, seizures, and right-sided hemiparesis who presents to the ED today after sustaining an unwitnessed fall.  Patient is a poor historian and is difficult to understand during history taking. ? ?The patient states that, around 4 AM today, he was in his wheelchair and attempted to transfer himself to the toilet seat, however while doing so he slipped on some water and fell.  He also states that he vomited once and that it appeared white.  Denies diarrhea, constipation, dysuria.  Denies any injury, including to his head.  Denies loss of consciousness, bowel or bladder dysfunction, chest pain, or shortness of breath.  Denies prodromal symptoms.  No recent changes to his medications.  No sick contacts.  He states that he takes Dilantin but does not take Keppra currently.  States he has not been eating well the past few days.  He states that he drinks about 1/5 of vodka daily and that his seizures tend to occur when he drinks.  His seizures were controlled for a while, about 6 to 8 months.  Unclear when his seizures returned.  He was unable to clearly elaborate on this further.  He states he had a stroke about 2 years ago with residual right-sided hemiparesis.  No other complaints or concerns. ? ?Collateral from [place] facility: Staff states that he fell earlier this morning, told staff he was feeling dizzy and  was having some nausea/vomiting, and had some grayish colored stools. They state that he has been on the phenytoin 300 mg for a long time. He is full code. ? ? ?Meds (from [facility]): ?Aspirin 81 mg daily ?Duloxetine 30 mg daily ?Gabapentin 300 mg BID ?Keppra 750 mg BID ?Lisinopril 10 mg daily ?Phenytoin 300 mg at bedtime ?Trazodone 100 mg at night ?Zonisamide 50 mg 2 tablets BID ? ?Allergies: ?Allergies as of 12/06/2021 - Review Complete 12/06/2021  ?Allergen Reaction Noted  ? Carbamazepine Other (See Comments) 12/18/2011  ? Tricyclic antidepressants Other (See Comments) 12/18/2011  ? ?Past Medical History:  ?Diagnosis Date  ? CVA (cerebral infarction)   ? Dilantin toxicity   ? 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 History of craniotomy   ? Hypertension   ? Organic brain syndrome   ? Pneumonia   ? Polysubstance abuse ([place])   ? Right hemiparesis ([place])   ? SDH (subdural hematoma) ([facility])   ? Seizures ([place])   ? ?Family History: No pertinent family history ? ?Social History: Lives at [facility] assisted living. Uses a wheelchair to get around the facility.  He is smoked about a pack a day since he was about 76 years old.  Unable to obtain clear history of alcohol use, as patient states he does not drink but also  states he drinks about 1/5 of vodka a day as well as wine. ? ?Review of Systems: ?A complete ROS was negative except as per HPI.  ? ?Physical Exam: ?Blood pressure 131/76, pulse 67, temperature 98.3 ?F (36.8 ?C), temperature source Oral, resp. rate 12, SpO2 100 %. ?General: NAD, thin-appearing ?HE: Normocephalic, atraumatic, EOMI, Conjunctivae normal ?ENT: No congestion, no rhinorrhea, no exudate or erythema, dry mucous membranes ?Cardiovascular: Normal rate, regular rhythm. No murmurs, rubs, or gallops ?Pulmonary: Effort normal, breath sounds normal. No wheezes, rales, or rhonchi ?Abdominal: soft, nontender, bowel sounds present ?Musculoskeletal: no swelling, deformity, injury or tenderness in extremities ?Skin:  Warm, dry, no bruising, erythema, or rash ?Psychiatric/Behavioral: normal mood, normal behavior   ?Neuro: Alert and oriented to situation.  Difficulty following commands during neuro exam, therefore limited exam.  Has right-sided hemiparesis which she states is unchanged since his last stroke. ? ?EKG: personally reviewed my interpretation is sinus rhythm with borderline QT prolongation ? ?CXR: personally reviewed my interpretation is no acute cardiopulmonary disease ? ?Pelvis films: No evidence of fracture or diastasis ? ?CT head without contrast: No acute findings ? ?CT cervical spine without contrast: Unremarkable ? ?CT chest/abdomen/pelvis with contrast is unremarkable. ? ? ? ? ?Assessment & Plan by Problem: ?Principal Problem: ?  Acute metabolic encephalopathy ? ?Acute metabolic encephalopathy  ?Dilantin toxicity ?Poor po intake ?History of seizures ?Severe protein calorie malnutrition ?The patient presents here after sustaining an unwitnessed fall and also presents with symptoms of dizziness and nausea/vomiting.  Review of records shows that he has presented to the ED multiple times in the past with Dilantin toxicity. He also admits to poor p.o. intake in the past few days especially. Unclear exactly how much alcohol the patient drinks.  ?In the ED, his mental status was noted to be at baseline, with some soft blood pressures but otherwise hemodynamically stable. 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 Dilantin levels were noted to be at 33.5.  Potassium was initially noted to be at 6.2 though repeat was 4.3. UA with >50 WBCs, small leukocytes, and many bacteria. Trauma imaging was negative. AMS could be from a combination of factors, including dehydration, phenytoin toxicity, possible UTI though denies urinary symptoms so will hold off on tx for now. Will continue fluid resuscitation and trend phenytoin levels per poison control.  ?-IV LR @ 100 mL/hr and 1L NS bolus ?-Holding  phenytoin, trending phenytoin levels q6h ?-Restarted keppra regimen ?-Zofran prn ?-Trend CMP ?-NPO pending swallow screen ? ?H/o Hypertension ?The patient is on lisinopril 10 mg daily at home for his hypertension.  Patient presented here with softer blood pressures, most recently 103/79. ?-Hold lisinopril in the setting of soft blood pressures ? ?Microcytic anemia ?Hemoglobin on admit of 7, his hemoglobin was noted to be about 7.5-8 a year ago. No mention of overt bleeding by patient or staff from his facility. In November 2021, iron levels, TIBC, iron saturation levels were low while ferritin is elevated to 1400.  Patient has received iron supplementation in the past.   ?-Transfuse for hemoglobin less than 7 ?-Trend CBC ?-Iron and TIBC, ferritin pending ? ?Alcohol use ?Unclear exactly how much alcohol the patient drinks, but at one point does state that he drinks about 1/5 of vodka and drinks wine. Also states that he tends to have seizures when he drinks. ?-CIWA without ativan ?-Folic acid and thiamine supplementation ?-Ethanol levels pending ? ?Dispo: Admit patient to Observation with expected length of stay less than  2 midnights. ? ?Signed: ?Orvis Brill, MD ?12/06/2021, 7:10 PM  ?Pager: [phone]  ?After 5pm on weekdays and 1pm on weekends: On Call pager: [phone] ? ?

## 2021-12-06 NOTE — ED Provider Notes (Signed)
?[place] [facility] ?Provider Note ? ? ?CSN: [id number] ?Arrival date & time: 12/06/21  1119 ? ?  ? ?History ? ?Chief Complaint  ?Patient presents with  ? Fall  ? ? ?[name] is a 76 y.o. male. ? ?Pt is a 76 yo male with a pmhx significant for htn, cva, hx sdh, seizures, and right sided hemiparesis.  Pt is coming from his SNF where he had an unwitnessed fall.  Pt said he was trying to get from his wheelchair to the toilet when he lost balance and fell.  Pt is not on blood thinners.  No loc.  He did vomit once pta.  He has had diarrhea.  He denies f/c.  He denies any pain. ? ? ?  ? ?Home Medications ?Prior to Admission medications   ?Medication Sig Start Date End Date Taking? Authorizing Provider  ?acetaminophen (TYLENOL) 325 MG tablet Take 650 mg by mouth every morning.   Yes [provider]  ?acetaminophen (TYLENOL) 500 MG tablet Take 500 mg by mouth every 6 (six) hours as needed for headache or fever.   Yes [provider]  ?alum & mag hydroxide-simeth (MAALOX/MYLANTA) 200-200-20 MG/5ML suspension Take 30 mLs by mouth daily as needed for indigestion or heartburn.   Yes [provider]  ?aspirin EC 81 MG tablet Take 81 mg by mouth daily.   Yes [provider]  ?DULoxetine (CYMBALTA) 30 MG capsule Take 30 mg by mouth daily.   Yes [provider]  ?Emollient (AQUAPHOR ADVANCED THERAPY) OINT Apply 1 application. topically daily. Spread topically to bilateral lower extremities.   Yes [provider]  ?gabapentin (NEURONTIN) 300 MG capsule Take 300 mg by mouth 2 (two) times daily.   Yes [provider]  ?guaiFENesin (ROBITUSSIN) 100 MG/5ML liquid Take 10 mLs by mouth every 6 (six) hours as needed for cough or to loosen phlegm.   Yes [provider]  ?latanoprost (XALATAN) 0.005 % ophthalmic solution Place 1 drop into both eyes at bedtime.   Yes [provider]  ?levETIRAcetam (KEPPRA) 750 MG tablet  Take 750 mg by mouth 2 (two) times daily.   Yes [provider]  ?lisinopril (ZESTRIL) 10 MG tablet Take 10 mg by mouth daily.   Yes [provider]  ?loperamide (IMODIUM) 2 MG capsule Take 2 mg by mouth daily as needed for diarrhea or loose stools. Not to exceed 8 doses in 24 hours   Yes [provider]  ?magnesium hydroxide (MILK OF MAGNESIA) 400 MG/5ML suspension Take 30 mLs by mouth at bedtime as needed for mild constipation.   Yes [provider]  ?Menthol, Topical Analgesic, 4 % GEL Apply 1 application. topically in the morning and at bedtime. Apply to both shoulders   Yes [provider]  ?Neomycin-Bacitracin-Polymyxin (TRIPLE ANTIBIOTIC) [phone] OINT Apply 1 application. topically daily as needed (minor skin tears or abrasions).   Yes [provider]  ?ondansetron (ZOFRAN) 4 MG tablet Take 4 mg by mouth every 6 (six) hours as needed for nausea or vomiting.   Yes [provider]  ?phenytoin (DILANTIN) 100 MG ER capsule Take 300 mg by mouth at bedtime.   Yes [provider]  ?traZODone (DESYREL) 100 MG tablet Take 100 mg by mouth at bedtime.   Yes [provider]  ?UNABLE TO FIND Take 1 Dose by mouth 3 (three) times daily. "Mighty Shake"   Yes [provider]  ?Zinc Oxide 12 % CREA Apply 1  application. topically See admin instructions. Every shift for dermatitis   Yes [provider]  ?zonisamide (ZONEGRAN) 50 MG capsule Take 3 capsules (150 mg total) by mouth daily. ?Patient taking differently: Take 100 mg by mouth in the morning and at bedtime. 10/23/16  Yes Eulogio Bear U, DO  ?ferrous sulfate 325 (65 FE) MG tablet Take 1 tablet (325 mg total) by mouth daily with breakfast. ?Patient not taking: Reported on 12/06/2021 07/01/20 09/29/20  Kayleen Memos, DO  ?potassium chloride SA (KLOR-CON) 20 MEQ tablet Take 1 tablet (20 mEq total) by mouth daily for 7 days. ?Patient not taking: Reported on 12/06/2021 07/01/20  07/08/20  Kayleen Memos, DO  ?   ? ?Allergies    ?Carbamazepine and Tricyclic antidepressants   ? ?Review of Systems   ?Review of Systems  ?Gastrointestinal:  Positive for diarrhea, nausea and vomiting.  ?All other systems reviewed and are negative. ? ?Physical Exam ?Updated Vital Signs ?BP 103/79   Pulse 67   Temp 98.3 ?F (36.8 ?C) (Oral)   Resp 18   SpO2 100%  ?Physical Exam ?Vitals and nursing note reviewed.  ?Constitutional:   ?   Appearance: He is underweight.  ?HENT:  ?   Right Ear: External ear normal.  ?   Left Ear: External ear normal.  ?   Nose: Nose normal.  ?   Mouth/Throat:  ?   Mouth: Mucous membranes are dry.  ?Eyes:  ?   Extraocular Movements: Extraocular movements intact.  ?   Conjunctiva/sclera: Conjunctivae normal.  ?   Pupils: Pupils are equal, round, and reactive to light.  ?Cardiovascular:  ?   Rate and Rhythm: Normal rate and regular rhythm.  ?   Pulses: Normal pulses.  ?   Heart sounds: Normal heart sounds.  ?Pulmonary:  ?   Effort: Pulmonary effort is normal.  ?   Breath sounds: Normal breath sounds.  ?Abdominal:  ?   General: Abdomen is flat. Bowel sounds are normal.  ?   Palpations: Abdomen is soft.  ?Musculoskeletal:     ?   General: Normal range of motion.  ?   Cervical back: Normal range of motion and neck supple.  ?Skin: ?   General: Skin is warm.  ?   Capillary Refill: Capillary refill takes less than 2 seconds.  ?Neurological:  ?   Mental Status: He is alert. Mental status is at baseline.  ?   Comments: Right sided hemiparesis  ?Psychiatric:     ?   Mood and Affect: Mood normal.     ?   Behavior: Behavior normal.  ? ? ?ED Results / Procedures / Treatments   ?Labs ?(all labs ordered are listed, but only abnormal results are displayed) ?Labs Reviewed  ?COMPREHENSIVE METABOLIC PANEL - Abnormal; Notable for the following components:  ?    Result Value  ? Potassium 6.2 (*)   ? CO2 21 (*)   ? Calcium 8.2 (*)   ? Total Protein 6.3 (*)   ? Albumin 3.4 (*)   ? Alkaline Phosphatase 127 (*)    ? Total Bilirubin 0.1 (*)   ? All other components within normal limits  ?CBC WITH DIFFERENTIAL/PLATELET - Abnormal; Notable for the following components:  ? RBC 2.95 (*)   ? Hemoglobin 7.0 (*)   ? HCT 21.9 (*)   ? MCV 74.2 (*)   ? MCH 23.7 (*)   ? RDW 17.2 (*)   ? All other components within normal limits  ?PHENYTOIN  LEVEL, TOTAL - Abnormal; Notable for the following components:  ? Phenytoin Lvl 33.5 (*)   ? All other components within normal limits  ?BASIC METABOLIC PANEL - Abnormal; Notable for the following components:  ? CO2 21 (*)   ? Calcium 7.9 (*)   ? All other components within normal limits  ?URINALYSIS, ROUTINE W REFLEX MICROSCOPIC  ? ? ?EKG ?EKG Interpretation ? ?Date/Time:  Wednesday Dec 06 2021 [id number] EDT ?Ventricular Rate:  66 ?PR Interval:  150 ?QRS Duration: 82 ?QT Interval:  456 ?QTC Calculation: 478 ?R Axis:   53 ?Text Interpretation: Sinus rhythm Minimal ST elevation, inferior leads Borderline prolonged QT interval No significant change since last tracing Confirmed by Isla Pence [phone]) on 12/06/2021 4:39:20 PM ? ?Radiology ?CT Head Wo Contrast ? ?Result Date: 12/06/2021 ?CLINICAL DATA:  Head trauma, minor (Age >= 65y); Neck trauma (Age >= 65y) EXAM: CT HEAD WITHOUT CONTRAST CT CERVICAL SPINE WITHOUT CONTRAST TECHNIQUE: Multidetector CT imaging of the head and cervical spine was performed following the standard protocol without intravenous contrast. Multiplanar CT image reconstructions of the cervical spine were also generated. RADIATION DOSE REDUCTION: This exam was performed according to the departmental dose-optimization program which includes automated exposure control, adjustment of the mA and/or kV according to patient size and/or use of iterative reconstruction technique. COMPARISON:  None Available. FINDINGS: CT HEAD FINDINGS Brain: No evidence of acute large vascular territory infarction, hemorrhage, hydrocephalus, extra-axial collection or mass lesion/mass effect. Right frontal  cranioplasty. Associated artifact does limit of a evaluation the adjacent brain. Similar cerebral atrophy and chronic microvascular ischemic disease. Vascular: Calcific intracranial atherosclerosis. Skull: R

## 2021-12-06 NOTE — ED Triage Notes (Signed)
Pt BIB [facility] from [facility] d/t an unwitnessed fall. Pt was trying to transfer from wheelchair from toilet, lost balance then fell. Pt stated he hit his head, no LOC. Not on blood thinner. Pt had 1 episode of vomit per nursing home staff. EMS applied C-collar on. Pt has no complaints at this time. A&O X3. VSS.  ?BP 96/50 ?HR 70 ?Sat 2 98% RA ?Cbg 184  ?

## 2021-12-07 DIAGNOSIS — T420X1A Poisoning by hydantoin derivatives, accidental (unintentional), initial encounter: Secondary | ICD-10-CM

## 2021-12-07 DIAGNOSIS — Z7982 Long term (current) use of aspirin: Secondary | ICD-10-CM | POA: Diagnosis not present

## 2021-12-07 DIAGNOSIS — Z23 Encounter for immunization: Secondary | ICD-10-CM | POA: Diagnosis not present

## 2021-12-07 DIAGNOSIS — G928 Other toxic encephalopathy: Secondary | ICD-10-CM | POA: Diagnosis present

## 2021-12-07 DIAGNOSIS — G9341 Metabolic encephalopathy: Secondary | ICD-10-CM

## 2021-12-07 DIAGNOSIS — R64 Cachexia: Secondary | ICD-10-CM | POA: Diagnosis present

## 2021-12-07 DIAGNOSIS — I69351 Hemiplegia and hemiparesis following cerebral infarction affecting right dominant side: Secondary | ICD-10-CM | POA: Diagnosis not present

## 2021-12-07 DIAGNOSIS — G40909 Epilepsy, unspecified, not intractable, without status epilepticus: Secondary | ICD-10-CM | POA: Diagnosis present

## 2021-12-07 DIAGNOSIS — I10 Essential (primary) hypertension: Secondary | ICD-10-CM | POA: Diagnosis present

## 2021-12-07 DIAGNOSIS — Z79899 Other long term (current) drug therapy: Secondary | ICD-10-CM | POA: Diagnosis not present

## 2021-12-07 DIAGNOSIS — Z681 Body mass index (BMI) 19 or less, adult: Secondary | ICD-10-CM | POA: Diagnosis not present

## 2021-12-07 DIAGNOSIS — E43 Unspecified severe protein-calorie malnutrition: Secondary | ICD-10-CM | POA: Diagnosis present

## 2021-12-07 DIAGNOSIS — T420X5A Adverse effect of hydantoin derivatives, initial encounter: Secondary | ICD-10-CM | POA: Diagnosis present

## 2021-12-07 DIAGNOSIS — E860 Dehydration: Secondary | ICD-10-CM | POA: Diagnosis present

## 2021-12-07 DIAGNOSIS — D509 Iron deficiency anemia, unspecified: Secondary | ICD-10-CM | POA: Diagnosis present

## 2021-12-07 LAB — CBC
HCT: 23.2 % — ABNORMAL LOW (ref 39.0–52.0)
Hemoglobin: 7.5 g/dL — ABNORMAL LOW (ref 13.0–17.0)
MCH: 23.7 pg — ABNORMAL LOW (ref 26.0–34.0)
MCHC: 32.3 g/dL (ref 30.0–36.0)
MCV: 73.2 fL — ABNORMAL LOW (ref 80.0–100.0)
Platelets: 375 10*3/uL (ref 150–400)
RBC: 3.17 MIL/uL — ABNORMAL LOW (ref 4.22–5.81)
RDW: 16.4 % — ABNORMAL HIGH (ref 11.5–15.5)
WBC: 9.3 10*3/uL (ref 4.0–10.5)
nRBC: 0 % (ref 0.0–0.2)

## 2021-12-07 LAB — COMPREHENSIVE METABOLIC PANEL
ALT: 13 U/L (ref 0–44)
AST: 18 U/L (ref 15–41)
Albumin: 3.3 g/dL — ABNORMAL LOW (ref 3.5–5.0)
Alkaline Phosphatase: 140 U/L — ABNORMAL HIGH (ref 38–126)
Anion gap: 8 (ref 5–15)
BUN: 9 mg/dL (ref 8–23)
CO2: 23 mmol/L (ref 22–32)
Calcium: 8.4 mg/dL — ABNORMAL LOW (ref 8.9–10.3)
Chloride: 109 mmol/L (ref 98–111)
Creatinine, Ser: 0.53 mg/dL — ABNORMAL LOW (ref 0.61–1.24)
GFR, Estimated: >60 mL/min (ref >60)
Glucose, Bld: 90 mg/dL (ref 70–99)
Potassium: 4 mmol/L (ref 3.5–5.1)
Sodium: 140 mmol/L (ref 135–145)
Total Bilirubin: 0.4 mg/dL (ref 0.3–1.2)
Total Protein: 6.5 g/dL (ref 6.5–8.1)

## 2021-12-07 LAB — PHENYTOIN LEVEL, TOTAL
Phenytoin Lvl: 33.9 ug/mL (ref 10.0–20.0)
Phenytoin Lvl: 32.5 ug/mL (ref 10.0–20.0)
Phenytoin Lvl: 30.3 ug/mL (ref 10.0–20.0)

## 2021-12-07 MED ORDER — SODIUM CHLORIDE 0.9 % IV BOLUS
500.0000 mL | Freq: Once | INTRAVENOUS | Status: AC
  Administered 2021-12-07: 500 mL via INTRAVENOUS

## 2021-12-07 MED ORDER — ENOXAPARIN SODIUM 30 MG/0.3ML IJ SOSY
30.0000 mg | PREFILLED_SYRINGE | INTRAMUSCULAR | Status: DC
  Administered 2021-12-07 – 2021-12-10 (×4): 30 mg via SUBCUTANEOUS
  Filled 2021-12-07 (×4): qty 0.3

## 2021-12-07 MED ORDER — PNEUMOCOCCAL 20-VAL CONJ VACC 0.5 ML IM SUSY
0.5000 mL | PREFILLED_SYRINGE | INTRAMUSCULAR | Status: AC
Start: 2021-12-08 — End: 2021-12-08
  Administered 2021-12-08: 0.5 mL via INTRAMUSCULAR
  Filled 2021-12-07: qty 0.5

## 2021-12-07 NOTE — ED Notes (Signed)
Pt cleaned and full linen change performed ?

## 2021-12-07 NOTE — ED Notes (Addendum)
Lab results- Dilantin 33.9- Dr. Posey Rea notified. ?

## 2021-12-07 NOTE — Progress Notes (Shared)
? ?  Subjective: *** ? ?Objective: ? ?Vital signs in last 24 hours: ?Vitals:  ? 12/07/21 0500 12/07/21 0507 12/07/21 0530 12/07/21 0600  ?BP: 118/73 118/73 (!) 134/95 (!) 124/92  ?Pulse:  68    ?Resp: 14 14 16 14   ?Temp:  98.1 ?F (36.7 ?C)    ?TempSrc:  Oral    ?SpO2:  96% 97% 96%  ? ?General: NAD, nl appearance ?HE: Normocephalic, atraumatic, EOMI, Conjunctivae normal ?ENT: No congestion, no rhinorrhea, no exudate or erythema  ?Cardiovascular: Normal rate, regular rhythm. No murmurs, rubs, or gallops ?Pulmonary: Effort normal, breath sounds normal. No wheezes, rales, or rhonchi ?Abdominal: soft, nontender, bowel sounds present ?Musculoskeletal: no swelling, deformity, injury or tenderness in extremities ?Skin: Warm, dry, no bruising, erythema, or rash ?Psychiatric/Behavioral: normal mood, normal behavior    ? ? ?Assessment/Plan: ? ?Principal Problem: ?  Acute metabolic encephalopathy ? ?Acute metabolic encephalopathy  ?Dilantin toxicity ?Poor po intake ?UTI ?History of seizures ?Severe protein calorie malnutrition ?The patient presents here after sustaining an unwitnessed fall and also presents with symptoms of dizziness and nausea/vomiting.  Review of records shows that he has presented to the ED multiple times in the past with Dilantin toxicity. He also admits to poor p.o. intake in the past few days especially. Unclear exactly how much alcohol the patient drinks.  ?In the ED, his mental status was noted to be at baseline, with some soft blood pressures but otherwise hemodynamically stable. 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 Dilantin levels were noted to be at 33.5.  Potassium was initially noted to be at 6.2 though repeat was 4.3. UA with >50 WBCs, small leukocytes, and many bacteria. Trauma imaging was negative. AMS could be from a combination of factors, including dehydration, phenytoin toxicity, possible UTI though denies urinary symptoms so will hold off on tx for now. Will  continue fluid resuscitation and trend phenytoin levels per poison control. ?-IV LR @ 100 mL/hr and 1L NS bolus ?-Holding phenytoin, trending phenytoin levels q6h ?-Restarted keppra regimen ?-Zofran prn ?-Trend CMP ? ?H/o Hypertension ?The patient is on lisinopril 10 mg daily at home for his hypertension.  Patient presented here with softer blood pressures, most recently 103/79. ?-Hold lisinopril in the setting of soft blood pressures ?  ?Microcytic anemia ?Hemoglobin on admit of 7, his hemoglobin was noted to be about 7.5-8 a year ago. No mention of overt bleeding by patient or staff from his facility. Iron levels, TIBC, iron saturation levels were low while ferritin is elevated to 1100s. Patient has received iron supplementation in the past.  AoCD? ?-Transfuse for hemoglobin less than 7 ?-Trend CBC ? ?Alcohol use ?Unclear exactly how much alcohol the patient drinks, but at one point does state that he drinks about 1/5 of vodka and drinks wine. Also states that he tends to have seizures when he drinks. ?-CIWA without ativan ?-Folic acid and thiamine supplementation ? ?Prior to Admission Living Arrangement: ?Anticipated Discharge Location: ?Barriers to Discharge: ?Dispo: Anticipated discharge in approximately *** day(s).  ? ?Peter Brill, MD ?12/07/2021, 7:46 AM ?Pager: @MYPAGER @ ?After 5pm on weekdays and 1pm on weekends: On Call pager [phone] ? ?

## 2021-12-07 NOTE — Progress Notes (Addendum)
? ?Subjective: ? ?Overnight Events: No acute events or concerns overnight. ? ?The patient is not a reliable historian but states that overall he feels well today. He is not complaining of any pain or shortness of breath.  ? ?Objective: ? ?Vital signs in last 24 hours: ?Vitals:  ? 12/07/21 0500 12/07/21 0507 12/07/21 0530 12/07/21 0600  ?BP: 118/73 118/73 (!) 134/95 (!) 124/92  ?Pulse:  68    ?Resp: 14 14 16 14   ?Temp:  98.1 ?F (36.7 ?C)    ?TempSrc:  Oral    ?SpO2:  96% 97% 96%  ? ?Weight change:  ?No intake or output data in the 24 hours ending 12/07/21 0741 ? ?Physical Exam ?  ?Constitutional: chronically ill-appearing and sitting in bed, in no acute distress ?HENT: normocephalic atraumatic ?Eyes: pupils equal and round, conjunctiva non-erythematous ?Cardiovascular: regular rate with normal rhythm, no m/r/g ?Pulmonary/Chest: normal work of breathing on room air, lungs clear to auscultation bilaterally ?Abdominal: soft, non-tender, non-distended, bowel sounds present ?MSK: decreased bulk with normal tone ?Skin: warm and dry. ?Neurological: alert and answering questions. Some of his answers are not accurate or do not make sense in the context of the question. He is oriented to person and place but is not oriented to year. No focal deficits on exam. ?Psych: appropriate mood and affect  ? ?Assessment/Plan: ? ?Principal Problem: ?  Acute metabolic encephalopathy ? ?Acute metabolic encephalopathy 2/2 dilantin toxicity ?History of seizures ?Severe protein calorie malnutrition ?AMS likely secondary to phenytoin toxicity. Most recent phenytoin level of 33.9. Could also be from a dehydration with poor PO intake and heavy alcohol use. UA resulted with small leukocytes, negative nitrites, many bacteria, >50 WBCs on microscopy, though patient denies any urinary symptoms and remains afebrile in the hospital. Will continue supportive care and monitoring of his phenytoin levels.  ?-Holding phenytoin, trending phenytoin levels  q6h, last 33.9 at 0633 this morning ?-Continue Keppra regimen ?-Zofran prn ?-Trend BMP ?-Consult to RD placed today ?  ?H/o Hypertension ?The patient is on lisinopril 10 mg daily at home for his hypertension. Normotensive at this time, with systolics ranging from 110s-120s. ?-Holding home lisinopril ?  ?Microcytic anemia ?Hemoglobin on admit of 7, his hemoglobin was noted to be about 7.5-8 a year ago. No mention of overt bleeding by patient or staff from his facility. In November 2021, iron levels, TIBC, iron saturation levels were low while ferritin is elevated to 1400.  Patient has received iron supplementation in the past. His iron today is 23, ferritin is 1100, hemoglobin improved to 7.5. 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  ?-Transfuse for hemoglobin less than 7 ?-Trend CBC ?  ?Alcohol use ?Unclear exactly how much alcohol the patient drinks, but at one point does state that he drinks about 1/5 of vodka and drinks wine, though his history is unreliable. Ethanol level yesterday less than 10. Also states that he tends to have seizures when he drinks.  ?-CIWA without ativan ?-Folic acid and thiamine supplementation ? ?Diet: NPO pending swallow screen ?VTE: Lovenox ?IVF: LR  ?Code: Full ?  ?Prior to Admission Living Arrangement: Assisted Living ?Anticipated Discharge Location: Assisted Living ?Barriers to Discharge: Phenytoin toxicity  ?Dispo: Anticipated discharge in approximately 1-2 day(s).  ? ? LOS: 0 days  ? ?[id number], Medical Student ?12/07/2021, 7:41 AM  ? ?Attestation for Student Documentation: ? ?I personally was present and performed or re-performed the history, physical exam and medical decision-making activities of this service and have verified that the service and findings  are accurately documented in the student?s note. ? ?Andrey Campanile, MD ?12/07/2021, 12:59 PM ? ?

## 2021-12-07 NOTE — Progress Notes (Signed)
[facility] [id number]   - Civil engineer, [employer] [facility]) - Hospitalized Hospice Patient  ?  ?This is a current [facility] Hosp[facility] patient from [facility] Living with a terminal hospice diagnosis of Protein Calorie Malnutrition. Patient was transported to the [facility] ED on 12/06/21 for evaluation of unwitnessed fall with possible head trauma/diarrhea/vomiting per the nursing home staff. Facility unable to reach family so elected to call EMS to evaluate patient condition after discussing situation with [facility] MD.  He is admitted for acute metabolic encephalopathy. Patient is a Full Code.  Per Dr. Kirt Boys, an [facility] Physician, this is a related admission.  ? ?This patient is appropriate for inpatient care due to the need for skilled personnel needed for symptom management of Dilantin toxicity/acute metabolic encephalopathy.  ?  ?Bedside RN stated that patient is under q6hr evaluation of labs with no plans for discharge at this time.  ?Visited with patient in room with no visitors at bedside. Patient was resting in bed with eyes open in NAD, pleasantly confused and denying complaints of discomfort/diarrhea/vomiting at this time.  ?   ?V/S:  98.2, 83, 14, 138/70, 96% sat on RA ?I&O: none recorded ?Abnormal lab work: RBC 3.17, Hemo 7.5, HCT 23.2, Creat 0.53, Cal 8.4, Dilantin 33.5 ?Diagnostics:  Trauma imaging was negative. ?IVs/PRNs: No prns adm, patient started on keppra tab 750 mg BID ?  ?MD Problem list from EPIC:  ?Principal Problem:  Acute metabolic encephalopathy ?  ?Acute metabolic encephalopathy 2/2 dilantin toxicity ?History of seizures ?Severe protein calorie malnutrition ?AMS likely secondary to phenytoin toxicity. Most recent phenytoin level of 33.9. Could also be from a dehydration with poor PO intake and heavy alcohol use. UA resulted with small leukocytes, negative nitrites, many bacteria, >50 WBCs on microscopy, though patient denies any urinary symptoms and remains afebrile in the hospital. Will continue supportive  care and monitoring of his phenytoin levels.  ?-Holding phenytoin, trending phenytoin levels q6h, last 33.9 at 0633 this morning ?-Continue Keppra regimen ?-Zofran prn ?-Trend BMP ?-Consult to RD placed today ?  ?H/o Hypertension ?The patient is on lisinopril 10 mg daily at home for his hypertension. Normotensive at this time, with systolics ranging from 110s-120s. ?-Holding home lisinopril ?  ?Microcytic anemia ?Hemoglobin on admit of 7, his hemoglobin was noted to be about 7.5-8 a year ago. No mention of overt bleeding by patient or staff from his facility. In November 2021, iron levels, TIBC, iron saturation levels were low while ferritin is elevated to 1400.  Patient has received iron supplementation in the past. His iron today is 23, ferritin is 1100, hemoglobin improved to 7.5. 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  ?-Transfuse for hemoglobin less than 7 ?-Trend CBC ?  ?Alcohol use ?Unclear exactly how much alcohol the patient drinks, but at one point does state that he drinks about 1/5 of vodka and drinks wine, though his history is unreliable. Ethanol level yesterday less than 10. Also states that he tends to have seizures when he drinks.  ?-CIWA without ativan ?-Folic acid and thiamine supplementation ?  ?D/C planning- Ongoing - ACC will continue to follow daily to assess how we can support patient during hospitalization. ?Goals of Care:  Patient is a Full Code ?Communication with IDT- Updated ACC team. PCP made aware of hospitalization. ACC Transfer summary/Medication List given to 6N unit administrator ?Communication with PCG - Spoke to sister to update/support via telephone - made aware that ACC will continue to follow patient daily during hospitalization.  ? ?Should patient need  ambulance transport at discharge, please use [facility] as [facility] contracts this service with them for our active hospice patients.  ?  ?Please call with any hospice related questions/concerns, ?  ?Roda Shutters, RN ?[facility] Liaison  (in [place]) ?[phone] ? ?

## 2021-12-07 NOTE — Progress Notes (Addendum)
Bedside swallow eval done at this time. Pt passed evaluation with no dysphagia, coughing or choking noted.Md notified. ?Lung sounds remain clear bilaterally and pt remains awake and in upright position with family at bedside. ?

## 2021-12-08 DIAGNOSIS — E43 Unspecified severe protein-calorie malnutrition: Secondary | ICD-10-CM | POA: Insufficient documentation

## 2021-12-08 LAB — BASIC METABOLIC PANEL
Anion gap: 5 (ref 5–15)
BUN: 9 mg/dL (ref 8–23)
CO2: 24 mmol/L (ref 22–32)
Calcium: 8.5 mg/dL — ABNORMAL LOW (ref 8.9–10.3)
Chloride: 105 mmol/L (ref 98–111)
Creatinine, Ser: 0.58 mg/dL — ABNORMAL LOW (ref 0.61–1.24)
GFR, Estimated: >60 mL/min (ref >60)
Glucose, Bld: 69 mg/dL — ABNORMAL LOW (ref 70–99)
Potassium: 3.3 mmol/L — ABNORMAL LOW (ref 3.5–5.1)
Sodium: 134 mmol/L — ABNORMAL LOW (ref 135–145)

## 2021-12-08 LAB — GLUCOSE, CAPILLARY
Glucose-Capillary: 58 mg/dL — ABNORMAL LOW (ref 70–99)
Glucose-Capillary: 62 mg/dL — ABNORMAL LOW (ref 70–99)
Glucose-Capillary: 82 mg/dL (ref 70–99)
Glucose-Capillary: 77 mg/dL (ref 70–99)
Glucose-Capillary: 94 mg/dL (ref 70–99)

## 2021-12-08 LAB — PHENYTOIN LEVEL, TOTAL
Phenytoin Lvl: 35.4 ug/mL (ref 10.0–20.0)
Phenytoin Lvl: 27.7 ug/mL — ABNORMAL HIGH (ref 10.0–20.0)

## 2021-12-08 LAB — CBC
HCT: 23.2 % — ABNORMAL LOW (ref 39.0–52.0)
Hemoglobin: 7.8 g/dL — ABNORMAL LOW (ref 13.0–17.0)
MCH: 24.4 pg — ABNORMAL LOW (ref 26.0–34.0)
MCHC: 33.6 g/dL (ref 30.0–36.0)
MCV: 72.5 fL — ABNORMAL LOW (ref 80.0–100.0)
Platelets: 369 10*3/uL (ref 150–400)
RBC: 3.2 MIL/uL — ABNORMAL LOW (ref 4.22–5.81)
RDW: 16.1 % — ABNORMAL HIGH (ref 11.5–15.5)
WBC: 8.8 10*3/uL (ref 4.0–10.5)
nRBC: 0 % (ref 0.0–0.2)

## 2021-12-08 MED ORDER — SODIUM CHLORIDE 0.9 % IV SOLN
250.0000 mg | Freq: Every day | INTRAVENOUS | Status: AC
  Administered 2021-12-08 – 2021-12-11 (×4): 250 mg via INTRAVENOUS
  Filled 2021-12-08 (×4): qty 20

## 2021-12-08 MED ORDER — DEXTROSE 50 % IV SOLN: 1.0000 | Freq: Once | INTRAVENOUS | Status: DC

## 2021-12-08 MED ORDER — ADULT MULTIVITAMIN W/MINERALS CH
1.0000 | ORAL_TABLET | Freq: Every day | ORAL | Status: DC
  Administered 2021-12-09 – 2021-12-11 (×3): 1 via ORAL
  Filled 2021-12-08 (×3): qty 1

## 2021-12-08 MED ORDER — ADULT MULTIVITAMIN LIQUID CH
15.0000 mL | Freq: Every day | ORAL | Status: DC
  Administered 2021-12-08: 15 mL via ORAL
  Filled 2021-12-08: qty 15

## 2021-12-08 MED ORDER — SODIUM CHLORIDE 0.9 % IV BOLUS
1000.0000 mL | Freq: Once | INTRAVENOUS | Status: AC
  Administered 2021-12-08: 1000 mL via INTRAVENOUS

## 2021-12-08 MED ORDER — ENSURE ENLIVE PO LIQD
237.0000 mL | Freq: Two times a day (BID) | ORAL | Status: DC
  Administered 2021-12-08 – 2021-12-11 (×8): 237 mL via ORAL

## 2021-12-08 MED ORDER — POTASSIUM CHLORIDE 20 MEQ PO PACK
20.0000 meq | PACK | Freq: Three times a day (TID) | ORAL | Status: AC
  Administered 2021-12-08 – 2021-12-09 (×3): 20 meq via ORAL
  Filled 2021-12-08 (×3): qty 1

## 2021-12-08 MED ORDER — CHOLECALCIFEROL 10 MCG (400 UNIT) PO TABS
800.0000 [IU] | ORAL_TABLET | Freq: Every day | ORAL | Status: DC
Start: 2021-12-08 — End: 2021-12-11
  Administered 2021-12-08 – 2021-12-11 (×4): 800 [IU] via ORAL
  Filled 2021-12-08 (×4): qty 2

## 2021-12-08 MED ORDER — THIAMINE HCL 100 MG PO TABS
100.0000 mg | ORAL_TABLET | Freq: Every day | ORAL | Status: DC
  Administered 2021-12-09 – 2021-12-11 (×3): 100 mg via ORAL
  Filled 2021-12-08 (×3): qty 1

## 2021-12-08 MED ORDER — FOLIC ACID 1 MG PO TABS
1.0000 mg | ORAL_TABLET | Freq: Every day | ORAL | Status: DC
  Administered 2021-12-09 – 2021-12-11 (×3): 1 mg via ORAL
  Filled 2021-12-08 (×3): qty 1

## 2021-12-08 NOTE — Progress Notes (Signed)
Initial Nutrition Assessment ? ?DOCUMENTATION CODES:  ? ?Underweight, Severe malnutrition in context of chronic illness ? ?INTERVENTION:  ?- Liberalize diet from a carb modified to a regular diet to provide widest variety of menu options to enhance nutritional adequacy ? ?- Ensure Enlive po BID, each supplement provides 350 kcal and 20 grams of protein. ? ?- MVI with minerals daily ? ?- Reached out to MD with recommendation to replete low potassium ? ?NUTRITION DIAGNOSIS:  ? ?Severe Malnutrition related to chronic illness (CVA, seizures) as evidenced by severe fat depletion, severe muscle depletion, moderate fat depletion. ? ?GOAL:  ? ?Patient will meet greater than or equal to 90% of their needs ? ?MONITOR:  ? ?PO intake, Supplement acceptance, Labs, Diet advancement, Weight trends ? ?REASON FOR ASSESSMENT:  ? ?Consult ?Assessment of nutrition requirement/status, Poor PO ? ?ASSESSMENT:  ? ?Pt is a patient of [facility] Hospice admitted from [facility] after a fall, noted to have acute metabolic encephalopathy. PMH significant for HTN, prior CVA, SDH, seizures, and R sided hemiparesis. ? ?Pt sleeping at time of visit. He awoke to shoulder tap. Pt provided limited history. Pt shook his head yes when asked if he was eating meals. Observed breakfast and lunch trays on counter. He ate most of his breakfast tray and all but his salad and rice for lunch. He denies drinking Ensure. Reviewed chart, pt had 1 Ensure this morning. Review of chart shows pt with h/o alcohol use unable to confirm with pt.  ? ?Meal completion: ?05/04: 50%-lunch ? ?Per review of chart, there is limited documentation of recent weights to assess for weight changes. Current admit weight noted to be 47.6 kg. Noted to have underweight BMI for several years.  ? ?Given presence of malnutrition, underweight BMI and no noted h/o diabetes, pt would benefit from a liberalized diet to provide widest variety of menu options available.   ? ?Medications: folic acid injection, thiamine ? ?Labs: sodium 134, potassium 3.3, Cr 0.53, Corrected calcium 8.96, alkaline phosphatase 140, CBG's 58-82 x 24 hours, Vitamin D 23.54 (replacing) ? ?NUTRITION - FOCUSED PHYSICAL EXAM: ? ?Flowsheet Row Most Recent Value  ?Orbital Region Severe depletion  ?Upper Arm Region Severe depletion  ?Thoracic and Lumbar Region Moderate depletion  ?Buccal Region Moderate depletion  ?Temple Region Severe depletion  ?Clavicle Bone Region Severe depletion  ?Clavicle and Acromion Bone Region Severe depletion  ?Scapular Bone Region Severe depletion  ?Dorsal Hand Moderate depletion  ?Patellar Region Severe depletion  ?Anterior Thigh Region Severe depletion  ?Posterior Calf Region Moderate depletion  ?Edema (RD Assessment) None  ?Hair Reviewed  ?Eyes Other (Comment)  [corneal arcus]  ?Mouth Unable to assess  ?Skin Reviewed  ?Nails Reviewed  ? ?  ? ?Diet Order:   ?Diet Order   ? ?       ?  Diet regular Room service appropriate? Yes; Fluid consistency: Thin  Diet effective now       ?  ? ?  ?  ? ?  ? ? ?EDUCATION NEEDS:  ? ?No education needs have been identified at this time ? ?Skin:  Skin Assessment: Reviewed RN Assessment ? ?Last BM:  5/4 (type 2) ? ?Height:  ? ?Ht Readings from Last 1 Encounters:  ?12/07/21 5\' 5"  (1.651 m)  ? ?Weight:  ? ?Wt Readings from Last 1 Encounters:  ?12/07/21 47.6 kg  ? ?BMI:  Body mass index is 17.47 kg/m?. ? ?Estimated Nutritional Needs:  ? ?Kcal:  1450-1650 ? ?Protein:  70-85g ? ?  Fluid:  >/=1.5L ? ?Drusilla Kanner, RDN, LDN ?Clinical Nutrition ?

## 2021-12-08 NOTE — Progress Notes (Signed)
[facility] [phone] [facility]) - Hospitalized Hospice Patient  ?  ?This is a current [facility] Hospice patient from [facility] Living with a terminal hospice diagnosis of Protein Calorie Malnutrition. Patient was transported to the [facility] ED on 12/06/21 for evaluation of unwitnessed fall with possible head trauma/diarrhea/vomiting per the nursing home staff. Facility unable to reach family so elected to call EMS to evaluate patient condition after discussing situation with [facility] MD.  He is admitted for acute metabolic encephalopathy. Patient is a Full Code.  Per Dr. Kirt Boys, an [facility] Physician, this is a related admission.  ?  ?This patient is appropriate for inpatient care due to the need for skilled personnel needed for symptom management of Dilantin toxicity/acute metabolic encephalopathy. Visited with patient in room with no visitors at bedside. Patient was resting in bed and welcomed MSW visited. Patient states that he will discharge next week and is eager to return to his ALF. Patient denied any needs at this time and was appreciative of bedside visit.   ?   ?V/S:   ?Vitals:  ?  12/07/21 1300 12/07/21 1603 12/07/21 2158 12/07/21 2200  ?BP:   126/67 128/83    ?Pulse:   82 97 74  ?Resp:   16 17    ?Temp:   98.3 ?F (36.8 ?C) 98.6 ?F (37 ?C)    ?TempSrc:   Oral Oral    ?SpO2:   100% (!) 79% 100%  ?Weight: 47.6 kg        ?Height: 5\' 5"  (1.651 m)     ?I&O:  ?Intake/Output Summary (Last 24 hours) at 12/08/2021 0714 ?Last data filed at 12/08/2021 0500 ?   ?Gross per 24 hour  ?Intake 600 ml  ?Output --  ?Net 600 ml  ?Abnormal lab work:  ? Latest Reference Range & Units 12/08/21 09:59  ?Sodium 135 - 145 mmol/L 134 (L)  ?Potassium 3.5 - 5.1 mmol/L 3.3 (L)  ?Glucose 70 - 99 mg/dL 69 (L)  ?Creatinine 0.61 - 1.24 mg/dL 02/07/22 (L)  ?Calcium 8.9 - 10.3 mg/dL 8.5 (L)  ?RBC 4.22 - 5.81 MIL/uL 3.20 (L)  ?Hemoglobin 13.0 - 17.0 g/dL 7.8 (L)  ?HCT 39.0 - 52.0 % 23.2 (L)  ?MCV 80.0 - 100.0 fL 72.5 (L)  ?MCH 26.0 - 34.0 pg 24.4  (L)  ?RDW 11.5 - 15.5 % 16.1 (H)  ? ?Phenytoin Lvl 10.0 - 20.0 ug/mL 27.7 (H)  ?(H): Data is abnormally high ?Diagnostics:  ?No new diagnostics listed. ?IVs/PRNs:  ?N/A ?MD Problem list from EPIC:  ?Principal Problem: ?  Acute metabolic encephalopathy ?Active Problems: ?  Phenytoin toxicity ?Acute metabolic encephalopathy 2/2 dilantin toxicity ?History of seizures ?Severe protein calorie malnutrition ?AMS likely secondary to phenytoin toxicity. Most recent phenytoin level of 35.4. AMS could also be from dehydration or protein malnutrition with poor PO intake and heavy alcohol use. UA resulted with small leukocytes, negative nitrites, many bacteria, >50 WBCs on microscopy, though patient denies any urinary symptoms and remains afebrile in the hospital. Will continue supportive care and monitoring of his phenytoin levels. Pharmacy recommended vitamin D and Iron supplementation given history of malnutrition with alcohol use. ?-Holding phenytoin, trending phenytoin levels QD, last 35.4 at 0039 this morning. ?-Continue Keppra regimen ?-Zofran prn ?-Trend BMP ?-Consult to RD pending ?-Vitamin D supplementation with cholecalciferol ?-Iron supplementation with ferric gluconate ?H/o Hypertension ?The patient is on lisinopril 10 mg daily at home for his hypertension. Normotensive at this time, with systolics mainly ranging in the 120s. ?-Holding  home lisinopril ?Microcytic anemia ?Hemoglobin on admit of 7, his hemoglobin was noted to be about 7.5-8 a year ago. No mention of overt bleeding by patient or by staff from his facility. Patient has received iron supplementation in the past. His iron levels during this admission are 23, ferritin is 1100, hemoglobin improved to 7.5 on hospital day 1. 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  ?-Transfuse for hemoglobin less than 7 ?-Trend CBC ?Alcohol use ?Unclear exactly how much alcohol the patient drinks, but at one point does state that he drinks about 1/5 of vodka and drinks wine,  though his history is unreliable. Ethanol levels less than 10. Also states that he tends to have seizures when he drinks.  ?-CIWA without ativan ?-Folic acid and thiamine supplementation ?-MVI ?D/C planning: ?Plan is for patient to return to [facility] once medically cleared. Facility is not able to accept patient over the weekend due to staffing. ACC will continue to follow daily to assess how we can support patient during hospitalization. ?Goals of Care:   ?Patient is a Full Code ?Communication with IDT: ?Updated ?Communication with PCG: ?Spoke to sister/Wanda and provided support/updates of discharge plans for patient to return to [facility]. Burna Mortimer appreciative of continued ACC support and reports that she plans to visit with patient today.  ? ?Should patient need ambulance transport at discharge, please use [facility] as [facility] contracts this service with them for our active hospice patients.  ?  ?Please call with any hospice related questions/concerns, ?  ?Odette Fraction, MSW ?[facility] Hospital Liaison (in [place]) ?[phone] ?

## 2021-12-08 NOTE — Progress Notes (Addendum)
Patient evaluated at bedside after phenytoin level returned further increased to 35.4. Patient was sleeping peacefully in bed and woke to voice easily. He engaged in conversation appropriately and denied any complaints. Cardiopulmonary exam WNL, no extremity edema, skin is dry. Compared to morning rounds 05/04 he does seem somewhat improved in terms of his answers being appropriate for waht is being asked. HD stable. ? ?Additional 1L bolus IVF ordered at this time. Continue to monitor q6h phenytoin levels. Would consider checking free phenytoin levels. ? ?Champ Mungo, DO ?

## 2021-12-08 NOTE — TOC Initial Note (Addendum)
Transition of Care (TOC) - Initial/Assessment Note  ? ? ?Patient Details  ?Name: [name] ?MRN: [id number] ?Date of Birth: 07-20-1946 ? ?Transition of Care (TOC) CM/SW Contact:    ?Jimmy Picket, LCSW ?Phone Number: ?12/08/2021, 3:50 PM ? ?Clinical Narrative:                 ? ?CSW spoke to [place] a [employer]. Pt is able to return at DC. Britta Mccreedy stated that Pt can feed himself but the staff provided all other care. Pt was wheelchair  bound PTA. [facility] requested that he is not discharged over the weekend due to their staffing. Pt will need an FL2 at DC. Pt will need ptar home at discharge.  ? ?Pt does not need [place] assessment. Pt is oriented to self only. Facility reports pt does not drink or do drugs.  ? ?CSW will continue to follow. ? ? ?Expected Discharge Plan: Assisted Living ?Barriers to Discharge: Continued Medical Work up ? ? ?Patient Goals and CMS Choice ?Patient states their goals for this hospitalization and ongoing recovery are:: Pt unable to verbalize goals ?CMS Medicare.gov Compare Post Acute Care list provided to:: Patient ?Choice offered to / list presented to : Patient, NA ? ?Expected Discharge Plan and Services ?Expected Discharge Plan: Assisted Living ?  ?  ?  ?Living arrangements for the past 2 months: Assisted Living Facility ?                ?  ?  ?  ?  ?  ?  ?  ?  ?  ?  ? ?Prior Living Arrangements/Services ?Living arrangements for the past 2 months: Assisted Living Facility ?Lives with:: Facility Resident ?Patient language and need for interpreter reviewed:: Yes ?Do you feel safe going back to the place where you live?: Yes      ?Need for Family Participation in Patient Care: Yes (Comment) ?Care giver support system in place?: Yes (comment) ?  ?Criminal Activity/Legal Involvement Pertinent to Current Situation/Hospitalization: No - Comment as needed ? ?Activities of Daily Living ?Home Assistive Devices/Equipment: Wheelchair ?ADL Screening (condition at time of  admission) ?Patient's cognitive ability adequate to safely complete daily activities?: No ?Is the patient deaf or have difficulty hearing?: No ?Does the patient have difficulty seeing, even when wearing glasses/contacts?: No ?Does the patient have difficulty concentrating, remembering, or making decisions?: Yes ?Patient able to express need for assistance with ADLs?: Yes ?Does the patient have difficulty dressing or bathing?: Yes ?Independently performs ADLs?: No ?Communication: Independent ?Dressing (OT): Needs assistance ?Is this a change from baseline?: Pre-admission baseline ?Grooming: Needs assistance ?Is this a change from baseline?: Pre-admission baseline ?Feeding: Independent ?Bathing: Dependent ?Is this a change from baseline?: Pre-admission baseline ?Toileting: Needs assistance ?Is this a change from baseline?: Pre-admission baseline ?In/Out Bed: Needs assistance ?Is this a change from baseline?: Pre-admission baseline ?Walks in Home: Dependent ?Is this a change from baseline?: Pre-admission baseline ?Does the patient have difficulty walking or climbing stairs?: Yes ?Weakness of Legs: Both ?Weakness of Arms/Hands: Both ? ?Permission Sought/Granted ?Permission sought to share information with : Family Supports ?Permission granted to share information with : Yes, Verbal Permission Granted ?   ? Permission granted to share info w AGENCY: ALF ?   ?   ? ?Emotional Assessment ?Appearance:: Appears stated age ?Attitude/Demeanor/Rapport: Unable to Assess ?Affect (typically observed): Unable to Assess ?Orientation: : Oriented to Self ?Alcohol / Substance Use: Not Applicable ?Psych Involvement: No (comment) ? ?Admission diagnosis:  Dehydration [E86.0] ?  Chronic anemia [D64.9] ?Accidental phenytoin poisoning, initial encounter [T42.0X1A] ?Fall, initial encounter [W19.XXXA] ?Acute metabolic encephalopathy [G93.41] ?Phenytoin toxicity [T42.0X1A] ?Patient Active Problem List  ? Diagnosis Date Noted  ? Protein-calorie  malnutrition, severe 12/08/2021  ? Phenytoin toxicity 12/07/2021  ? Acute metabolic encephalopathy 12/06/2021  ? Syncope 06/28/2020  ? Pressure injury of skin 06/28/2020  ? Seizure (HCC) 10/22/2016  ? Essential hypertension 10/22/2016  ? Microcytic anemia 10/22/2016  ? Colon cancer screening 12/15/2014  ? 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 Seizure disorder (HCC) 05/20/2013  ? Hemiparesis affecting dominant side as late effect of stroke (HCC) 05/20/2013  ? Tobacco use disorder 05/20/2013  ? ?PCP:  Herma Mering, NP ?Pharmacy:  No Pharmacies Listed ? ? ? ?Social Determinants of Health (SDOH) Interventions ?  ? ?Readmission Risk Interventions ?   ? View : No data to display.  ?  ?  ?  ? ?Jimmy Picket, LCSW ?Clinical Social Worker ? ? ?

## 2021-12-08 NOTE — Progress Notes (Signed)
CBG initially 58 at 0830. Pt awake and alert; remains asymptomatic.MD aware; D50 ordered if repeat CBG remains low. Pt given juice and CBG rechecked. Repeat CBG 82. D50 not given at this time. ?

## 2021-12-08 NOTE — Progress Notes (Signed)
?   12/08/21 0100  ?Notify: Provider  ?Provider Name/Title Dr Dean,MD  ?Date Provider Notified 12/08/21  ?Time Provider Notified [phone]  ?Notification Type Call  ?Notification Reason Critical result  ?Provider response No new orders  ?Date of Provider Response 12/08/21  ?Time of Provider Response 0150  ? ? ?

## 2021-12-08 NOTE — Progress Notes (Addendum)
? ?Subjective: ? ?Overnight Events: CBGs noted to be 58 this AM, pt given juice with return of CBGs to 82. ? ?The patient reports that overall he feels well today. He is not complaining of any pain or shortness of breath.  ? ?Objective: ? ?Vital signs in last 24 hours: ?Vitals:  ? 12/07/21 1300 12/07/21 1603 12/07/21 2158 12/07/21 2200  ?BP:  126/67 128/83   ?Pulse:  82 97 74  ?Resp:  16 17   ?Temp:  98.3 ?F (36.8 ?C) 98.6 ?F (37 ?C)   ?TempSrc:  Oral Oral   ?SpO2:  100% (!) 79% 100%  ?Weight: 47.6 kg     ?Height: 5\' 5"  (1.651 m)     ? ?Weight change:  ? ?Intake/Output Summary (Last 24 hours) at 12/08/2021 0714 ?Last data filed at 12/08/2021 0500 ?Gross per 24 hour  ?Intake 600 ml  ?Output --  ?Net 600 ml  ? ? ?Physical Exam ?  ?Constitutional: thin, chronically ill-appearing and sitting in bed, in no acute distress ?HENT: normocephalic atraumatic ?Eyes: pupils equal and round, conjunctiva non-erythematous ?Cardiovascular: regular rate with normal rhythm, no m/r/g ?Pulmonary/Chest: normal work of breathing on room air, lungs clear to auscultation bilaterally ?Abdominal: soft, non-tender, non-distended, bowel sounds present ?MSK: decreased bulk with normal tone ?Skin: warm and dry. ?Neurological: alert and answering questions. He is oriented to person and place but is not oriented to year. No focal deficits on exam. No nystagmus. ?Psych: appropriate mood and affect  ? ?Assessment/Plan: ? ?Principal Problem: ?  Acute metabolic encephalopathy ?Active Problems: ?  Phenytoin toxicity ? ?Acute metabolic encephalopathy 2/2 dilantin toxicity ?History of seizures ?Severe protein calorie malnutrition ?AMS likely secondary to phenytoin toxicity. Most recent phenytoin level of 35.4. AMS could also be from dehydration or protein malnutrition with poor PO intake and heavy alcohol use. UA resulted with small leukocytes, negative nitrites, many bacteria, >50 WBCs on microscopy, though patient denies any urinary symptoms and remains  afebrile in the hospital. Will continue supportive care and monitoring of his phenytoin levels. Pharmacy recommended vitamin D and Iron supplementation given history of malnutrition with alcohol use. ?-Holding phenytoin, trending phenytoin levels QD, last 35.4 at 0039 this morning. ?-Continue Keppra regimen ?-Zofran prn ?-Trend BMP ?-Consult to RD pending ?-Vitamin D supplementation with cholecalciferol ?-Iron supplementation with ferric gluconate ?  ?H/o Hypertension ?The patient is on lisinopril 10 mg daily at home for his hypertension. Normotensive at this time, with systolics mainly ranging in the 120s. ?-Holding home lisinopril ?  ?Microcytic anemia ?Hemoglobin on admit of 7, his hemoglobin was noted to be about 7.5-8 a year ago. No mention of overt bleeding by patient or by staff from his facility. Patient has received iron supplementation in the past. His iron levels during this admission are 23, ferritin is 1100, hemoglobin improved to 7.5 on hospital day 1. 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  ?-Transfuse for hemoglobin less than 7 ?-Trend CBC ?  ?Alcohol use ?Unclear exactly how much alcohol the patient drinks, but at one point does state that he drinks about 1/5 of vodka and drinks wine, though his history is unreliable. Ethanol levels less than 10. Also states that he tends to have seizures when he drinks.  ?-CIWA without ativan ?-Folic acid and thiamine supplementation ?-MVI ? ?Diet: Passed swallow screen. Carb modified diet. RD consult pending. ?VTE: Lovenox ?IVF: NS boluses prn for elevated dilantin levels   ?Code: Full ?  ?Prior to Admission Living Arrangement: Assisted Living ?Anticipated Discharge Location: Assisted Living ?  Barriers to Discharge: Phenytoin toxicity  ?Dispo: Anticipated discharge in approximately 1-2 day(s).  ? ? LOS: 1 day  ? ?Bishop Limbo, Medical Student ?12/08/2021, 7:14 AM  ? ?Attestation for Student Documentation: ? ?I personally was present and performed or re-performed the  history, physical exam and medical decision-making activities of this service and have verified that the service and findings are accurately documented in the student?s note. ? ?Andrey Campanile, MD ?12/08/2021, 12:27 PM ? ? ?

## 2021-12-08 NOTE — Progress Notes (Signed)
?   12/07/21 2145  ?Notify: Provider  ?Provider Name/Title Dr. August Saucer, MD  ?Date Provider Notified 12/07/21  ?Time Provider Notified 2145  ?Notification Type Call  ?Notification Reason Critical result  ?Provider response No new orders  ?Date of Provider Response 12/07/21  ?Time of Provider Response 2148  ? ? ?

## 2021-12-09 LAB — BASIC METABOLIC PANEL
Anion gap: 5 (ref 5–15)
BUN: 15 mg/dL (ref 8–23)
CO2: 23 mmol/L (ref 22–32)
Calcium: 8.5 mg/dL — ABNORMAL LOW (ref 8.9–10.3)
Chloride: 108 mmol/L (ref 98–111)
Creatinine, Ser: 0.68 mg/dL (ref 0.61–1.24)
GFR, Estimated: >60 mL/min (ref >60)
Glucose, Bld: 80 mg/dL (ref 70–99)
Potassium: 4.1 mmol/L (ref 3.5–5.1)
Sodium: 136 mmol/L (ref 135–145)

## 2021-12-09 LAB — CBC
HCT: 21.3 % — ABNORMAL LOW (ref 39.0–52.0)
Hemoglobin: 7.4 g/dL — ABNORMAL LOW (ref 13.0–17.0)
MCH: 24.4 pg — ABNORMAL LOW (ref 26.0–34.0)
MCHC: 34.7 g/dL (ref 30.0–36.0)
MCV: 70.3 fL — ABNORMAL LOW (ref 80.0–100.0)
Platelets: 304 10*3/uL (ref 150–400)
RBC: 3.03 MIL/uL — ABNORMAL LOW (ref 4.22–5.81)
RDW: 16 % — ABNORMAL HIGH (ref 11.5–15.5)
WBC: 7.1 10*3/uL (ref 4.0–10.5)
nRBC: 0 % (ref 0.0–0.2)

## 2021-12-09 LAB — PHENYTOIN LEVEL, TOTAL: Phenytoin Lvl: 25.2 ug/mL — ABNORMAL HIGH (ref 10.0–20.0)

## 2021-12-09 LAB — GLUCOSE, CAPILLARY
Glucose-Capillary: 68 mg/dL — ABNORMAL LOW (ref 70–99)
Glucose-Capillary: 109 mg/dL — ABNORMAL HIGH (ref 70–99)

## 2021-12-09 NOTE — Plan of Care (Signed)

## 2021-12-09 NOTE — Progress Notes (Signed)
CBG 68 this am. Grape juice is given. Pt is eating his breakfast. Will re-check. ?Report is given to [facility], poison control.  ?

## 2021-12-09 NOTE — Progress Notes (Signed)
? ?  Subjective: ? ?Overnight Events: NAE ? ?The patient reports that overall he feels well today. Denies any complaints or concerns at this time. ? ?Objective: ? ?Vital signs in last 24 hours: ?Vitals:  ? 12/08/21 1212 12/08/21 1633 12/08/21 1937 12/09/21 0436  ?BP:  118/70 118/66 135/75  ?Pulse:  77 80 71  ?Resp:  17 18 18   ?Temp:  98.7 ?F (37.1 ?C) 98.6 ?F (37 ?C) 98.4 ?F (36.9 ?C)  ?TempSrc:  Oral Oral Oral  ?SpO2: 100% 100% 100% 100%  ?Weight:      ?Height:      ? ?Weight change:  ? ?Intake/Output Summary (Last 24 hours) at 12/09/2021 0653 ?Last data filed at 12/09/2021 0636 ?Gross per 24 hour  ?Intake 330 ml  ?Output 400 ml  ?Net -70 ml  ? ? ?Physical Exam ?  ?Constitutional: thin, chronically ill-appearing and lying in bed, in no acute distress ?HENT: normocephalic atraumatic ?Eyes: pupils equal and round, conjunctiva non-erythematous ?Cardiovascular: regular rate with normal rhythm, no m/r/g ?Pulmonary/Chest: normal work of breathing on room air, lungs clear to auscultation bilaterally ?Abdominal: soft, non-tender, non-distended, bowel sounds present ?MSK: decreased bulk with normal tone ?Skin: warm and dry. ?Neurological: Alert and answering questions. Mental status is at baseline. No focal deficits on exam. No nystagmus. ?Psych: appropriate mood and affect  ? ?Assessment/Plan: ? ?Principal Problem: ?  Acute metabolic encephalopathy ?Active Problems: ?  Phenytoin toxicity ?  Protein-calorie malnutrition, severe ? ?Phenytoin toxicity ?History of seizures ?Severe protein calorie malnutrition ?Presented here with phenytoin toxicity with levels peaking at 35.4, now improving to 25.2 with supportive care measures. Toxicity likely 2/2 poor po intake in the setting of severe malnutrition. ?-Holding phenytoin, trending phenytoin levels QD ?-Continue Keppra regimen ?-Zofran prn ?-Ensure supplementation, appreciate RD recs ? ?H/o Hypertension ?The patient is on lisinopril 10 mg daily at home for his hypertension. Majority  of his BP's in the normotensive range today.  ?-Holding home lisinopril ? ?Microcytic anemia ?Hemoglobin on admit of 7, his hemoglobin was noted to be about 7.5-8 a year ago. No mention of overt bleeding by patient or by staff from his facility. Patient has received iron supplementation in the past. His iron levels during this admission are 23, ferritin is 1100, hemoglobin improved to 7.5 on hospital day 1. 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  ?-Transfuse for hemoglobin less than 7 ?-Trend CBC ?-Continue IV ferrlicit 250 mg, on day 2/4 ? ?Alcohol use ?Unclear exactly how much alcohol the patient drinks, but at one point does state that he drinks about 1/5 of vodka and drinks wine, though his history is unreliable. Ethanol levels less than 10. Also states that he tends to have seizures when he drinks.  ?-CIWA without ativan ?-Folic acid and thiamine supplementation ?-MVI ? ?Diet: Regular ?VTE: Lovenox ?IVF: None ?Code: Full ? ?Prior to Admission Living Arrangement: Assisted Living ?Anticipated Discharge Location: Assisted Living ?Barriers to Discharge: Phenytoin toxicity  ?Dispo: Anticipated discharge in approximately 1-2 day(s).  ? ? ?02/08/2022, MD, PGY-1 ?[facility] ?[phone] ? ? ? ?

## 2021-12-09 NOTE — Progress Notes (Signed)
CBG after breakfast 109. ?

## 2021-12-09 NOTE — Progress Notes (Signed)
Updates were given over the phone to pt's sister Burna Mortimer. ?

## 2021-12-09 NOTE — Discharge Summary (Shared)
? ?Name: [name] ?MRN: [id number] ?DOB: 06-05-1946 76 y.o. ?PCP: Herma Mering, NP ? ?Date of Admission: 12/06/2021 11:19 AM ?Date of Discharge: No discharge date for patient encounter. ?Attending Physician: Gust Rung, DO ? ?Discharge Diagnosis: ?1. *** ? ?Discharge Medications: ?Allergies as of 12/09/2021   ? ?   Reactions  ? Carbamazepine Other (See Comments)  ? unknown  ? Tricyclic Antidepressants Other (See Comments)  ? unknown  ? ?  ?Med Rec must be completed prior to using this SMARTLINK*** ? ? ? ? ? ? ?Disposition and follow-up:   ?Mr.[name] was discharged from [facility] in {DISCHARGE CONDITION:19696} condition.  At the hospital follow up visit please address: ? ?1.  *** ? ?2.  Labs / imaging needed at time of follow-up: *** ? ?3.  Pending labs/ test needing follow-up: *** ? ?Follow-up Appointments: ? ? ?Hospital Course by problem list: ?Acute metabolic encephalopathy  ?Dilantin toxicity ?Poor po intake ?History of seizures ?Severe protein calorie malnutrition ?The patient presents here after sustaining an unwitnessed fall and also presented with symptoms of dizziness and nausea/vomiting.  Review of records shows that he has presented to the ED multiple times in the past with Dilantin toxicity. He also admits to poor p.o. intake in the past few days especially. Unclear exactly how much alcohol the patient drinks.  ?In the ED, his mental status was noted to be at baseline, with some soft blood pressures but pt was otherwise hemodynamically stable. 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 Dilantin levels on admit were noted to be at 33.5.  Potassium was initially noted to be at 6.2 though repeat was 4.3. UA with >50 WBCs, small leukocytes, and many bacteria. Trauma imaging was negative. AMS was thought to be from a combination of possible factors, including dehydration, phenytoin toxicity, possible UTI though denies urinary symptoms so  held off on tx with abx. ?Throughout his hospitalization, his phenytoin levels were monitored and supportive care with PRN boluses was provided until his phenytoin levels steadily trended downward. They reached a normal range on ***. Due to his severe protein calorie malnutrition, we consulted RD who recommended Ensure supplementation. Otherwise, his home keppra regimen was continued while his phenytoin was held. ? ?H/o Hypertension ?The patient is on lisinopril 10 mg daily at home for his hypertension. His home lisinopril was held in the setting of lower blood pressures.  ?  ?Microcytic anemia ?Hemoglobin on admit of 7, his hemoglobin was noted to be about 7.5-8 a year ago. No mention of overt bleeding by patient or staff from his facility.  Patient has received iron supplementation in the past. His iron levels during this admission are 23, ferritin is 1100, hemoglobin improved to 7.5 on hospital day 1. 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 His CBC was trended throughout this admission.  ?  ?Alcohol use ?Unclear exactly how much alcohol the patient drinks, but at one point does state that he drinks about 1/5 of vodka and drinks wine. Also states that he tends to have seizures when he drinks.  He was placed on CIWA without Ativan, and folic acid and thiamine supplementation was given during his hospitalization.  Ethanol levels were unremarkable on admit. ? ?Discharge Exam:   ?BP 125/71 (BP Location: Left Arm)   Pulse 72   Temp 98 ?F (36.7 ?C)   Resp 18   Ht 5\' 5"  (1.651 m)   Wt 47.6 kg   SpO2 100%  BMI 17.47 kg/m?  ?Discharge exam: *** ? ?Pertinent Labs, Studies, and Procedures:  ?*** ? ?Discharge Instructions: ? ? ?Signed: ?Orvis Brill, MD ?12/09/2021, 11:50 AM   ?Pager: @MYPAGER @ ? ?

## 2021-12-09 NOTE — Progress Notes (Addendum)
[facility] [phone] [facility]) - Hospitalized Hospice Patient  ? ? This is a current [facility] Hospice patient from [facility] Living with a terminal hospice diagnosis of Protein Calorie Malnutrition. Patient was transported to the [facility] ED on 12/06/21 for evaluation of unwitnessed fall with possible head trauma/diarrhea/vomiting per the nursing home staff. Facility unable to reach family so elected to call EMS to evaluate patient condition after discussing situation with [facility] MD.  He is admitted for acute metabolic encephalopathy. Patient is a Full Code.  Per Dr. Kirt Boys, an [facility] Physician, this is a related admission. ? ?Patient sitting up watching tv upon nursing visit. He reports he is feeling ok this morning, he is waiting on his breakfast. Denies any needs at this time.  ?Patient is inpatient appropriate due to need for skilled nursing level of care, IV ferric gluconate  ? ?Vital Signs-  98/72/18   125/71    spO2 100% room air ?Intake/Output - not recorded ?Abnormal labs-  Ca+ 1.5, Hgb 7.4, Hct 21.3, RBC 3.03, Phenytoin level 25.2 ?Diagnostics-  none new ?IV/PRN Meds- Ferric Gluconate 250mg  IV daily ?Problem List ?Phenytoin toxicity ?History of seizures ?Severe protein calorie malnutrition ?Presented here with phenytoin toxicity with levels peaking at 35.4, now improving to 25.2 with supportive care measures. Toxicity likely 2/2 poor po intake in the setting of severe malnutrition. ?-Holding phenytoin, trending phenytoin levels QD ?-Continue Keppra regimen ?-Zofran prn ?-Ensure supplementation, appreciate RD recs ?  ?H/o Hypertension ?The patient is on lisinopril 10 mg daily at home for his hypertension. Majority of his BP's in the normotensive range today.  ?-Holding home lisinopril ?  ?Microcytic anemia ?Hemoglobin on admit of 7, his hemoglobin was noted to be about 7.5-8 a year ago. No mention of overt bleeding by patient or by staff from his facility. Patient has received iron supplementation  in the past. His iron levels during this admission are 23, ferritin is 1100, hemoglobin improved to 7.5 on hospital day 1. 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  ?-Transfuse for hemoglobin less than 7 ?-Trend CBC ?-Continue IV ferrlicit 250 mg, on day 2/4 ?  ?Alcohol use ?Unclear exactly how much alcohol the patient drinks, but at one point does state that he drinks about 1/5 of vodka and drinks wine, though his history is unreliable. Ethanol levels less than 10. Also states that he tends to have seizures when he drinks.  ?-CIWA without ativan ?-Folic acid and thiamine supplementation ?-MVI ? ?Discharge Planning- Likely d/c back to facility Monday ?Family Contact- None ?IDT- Updated ?Goals of care - Clear, patient wishes to be kept comfortable ?Should patient need ambulance transfer at discharge please use [facility]) as they contract this service for our active hospice patients.  ?Hospital transfer and medication list placed on patient's hard chart.  ?[facility], Thea Gist, BSN, WTA-C ?[facility] Liaison ?[phone] ? ? ? ? ? ? ?  ?

## 2021-12-10 DIAGNOSIS — G9341 Metabolic encephalopathy: Secondary | ICD-10-CM

## 2021-12-10 LAB — CBC
HCT: 20.1 % — ABNORMAL LOW (ref 39.0–52.0)
Hemoglobin: 7.1 g/dL — ABNORMAL LOW (ref 13.0–17.0)
MCH: 24.8 pg — ABNORMAL LOW (ref 26.0–34.0)
MCHC: 35.3 g/dL (ref 30.0–36.0)
MCV: 70.3 fL — ABNORMAL LOW (ref 80.0–100.0)
Platelets: 307 10*3/uL (ref 150–400)
RBC: 2.86 MIL/uL — ABNORMAL LOW (ref 4.22–5.81)
RDW: 15.9 % — ABNORMAL HIGH (ref 11.5–15.5)
WBC: 6.7 10*3/uL (ref 4.0–10.5)
nRBC: 0 % (ref 0.0–0.2)

## 2021-12-10 LAB — BASIC METABOLIC PANEL
Anion gap: 4 — ABNORMAL LOW (ref 5–15)
BUN: 15 mg/dL (ref 8–23)
CO2: 25 mmol/L (ref 22–32)
Calcium: 8.7 mg/dL — ABNORMAL LOW (ref 8.9–10.3)
Chloride: 109 mmol/L (ref 98–111)
Creatinine, Ser: 0.63 mg/dL (ref 0.61–1.24)
GFR, Estimated: >60 mL/min (ref >60)
Glucose, Bld: 85 mg/dL (ref 70–99)
Potassium: 4.6 mmol/L (ref 3.5–5.1)
Sodium: 138 mmol/L (ref 135–145)

## 2021-12-10 LAB — GLUCOSE, CAPILLARY: Glucose-Capillary: 77 mg/dL (ref 70–99)

## 2021-12-10 LAB — PHENYTOIN LEVEL, TOTAL: Phenytoin Lvl: 17 ug/mL (ref 10.0–20.0)

## 2021-12-10 NOTE — Progress Notes (Signed)
? ?  Subjective: ? ?Overnight Events: NAE ? ?Denies pain or SOB.  ? ?Objective: ? ?Vital signs in last 24 hours: ?Vitals:  ? 12/09/21 0816 12/09/21 1527 12/09/21 1915 12/10/21 0426  ?BP: 125/71 140/70 129/85 131/69  ?Pulse: 72 75 71 83  ?Resp: 18 18 18 18   ?Temp: 98 ?F (36.7 ?C) 98.1 ?F (36.7 ?C) 98.6 ?F (37 ?C) 98.7 ?F (37.1 ?C)  ?TempSrc:  Oral Oral Oral  ?SpO2: 100% 100% 100% 100%  ?Weight:      ?Height:      ? ?Weight change:  ? ?Intake/Output Summary (Last 24 hours) at 12/10/2021 02/09/2022 ?Last data filed at 12/10/2021 0430 ?Gross per 24 hour  ?Intake 450 ml  ?Output 650 ml  ?Net -200 ml  ? ? ?Physical Exam ? ?Constitutional: Chronically ill appearing, mal nourished, and in no distress.  ?HENT:  ?Head: Normocephalic and atraumatic.  ?Eyes: EOM are normal.  ?Neck: Normal range of motion.  ?Cardiovascular: Normal rate, regular rhythm, intact distal pulses. No gallop and no friction rub.  ?No murmur heard. No lower extremity edema  ?Pulmonary: Non labored breathing on room air, no wheezing or rales  ?Abdominal: Soft. Normal bowel sounds. Non distended and non tender ?Musculoskeletal: Normal range of motion.     ?   General: No tenderness or edema.  ?Neurological: Alert and oriented to person, place, states he is 12 and that it is 1941.  ?Skin: Skin is warm and dry.  ? ?Assessment/Plan: ? ?Principal Problem: ?  Acute metabolic encephalopathy ?Active Problems: ?  Phenytoin toxicity ?  Protein-calorie malnutrition, severe ? ?Phenytoin toxicity ?History of seizures ?Severe protein calorie malnutrition ?Presented here with phenytoin toxicity with levels peaking at 35.4, Resolved. Toxicity likely 2/2 poor po intake in the setting of severe malnutrition. ?-Plan to resume his phenytoin at 100mg  in the AM  ?-Continue Keppra regimen ?-Zofran prn ?-Ensure supplementation, appreciate RD recs ? ?H/o Hypertension ?The patient is on lisinopril 10 mg daily at home for his hypertension. Majority of his BP's in the normotensive range today.   ?-Holding home lisinopril can resume on discharge.  ? ?Microcytic anemia ?Hemoglobin on admit of 7, his hemoglobin was noted to be about 7.5-8 a year ago. No mention of overt bleeding by patient or by staff from his facility. Patient has received iron supplementation in the past. His iron levels during this admission are 23, ferritin is 1100, hemoglobin improved to 7.5 on hospital day 1. 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  ?-Transfuse for hemoglobin less than 7 ?-Lab holiday  ?-Continue IV ferrlicit 250 mg, on day 3/4 ? ?Alcohol use ?Unclear exactly how much alcohol the patient drinks, but at one point does state that he drinks about 1/5 of vodka and drinks wine, though his history is unreliable. Ethanol levels less than 10. Also states that he tends to have seizures when he drinks.  ?-CIWA without ativan ?-Folic acid and thiamine supplementation ?-MVI ? ?Diet: Regular ?VTE: Lovenox ?IVF: None ?Code: Full ? ?Prior to Admission Living Arrangement: Assisted Living ?Anticipated Discharge Location: Assisted Living ?Barriers to Discharge: Phenytoin toxicity  ?Dispo: Anticipated discharge in approximately 1-2 day(s).  ? ? ?55, MD PGY-2 ?Internal Medicine  ?Pager [phone] ? ? ? ? ? ?

## 2021-12-10 NOTE — Discharge Summary (Addendum)
? ?Name: [name] ?MRN: [id number] ?DOB: 09-12-1945 76 y.o. ?PCP: Peter Mering, NP ? ?Date of Admission: 12/06/2021 11:19 AM ?Date of Discharge:   12/11/2021 ?Attending Physician: Peter Rung, DO ? ?Discharge Diagnosis: ?1. Acute metabolic encephalopathy, Dilantin toxicity ?2. Severe protein calorie malnutrition ?3. Hypertension ?4. Alcohol Use Disorder ?5. Microcytic Anemia ? ? ?Discharge Medications: ?Allergies as of 12/11/2021   ? ?   Reactions  ? Carbamazepine Other (See Comments)  ? unknown  ? Tricyclic Antidepressants Other (See Comments)  ? unknown  ? ?  ? ?  ?Medication List  ?  ? ?STOP taking these medications   ? ?ferrous sulfate 325 (65 FE) MG tablet ?  ?potassium chloride SA 20 MEQ tablet ?Commonly known as: KLOR-CON M ?  ?UNABLE TO FIND ?  ? ?  ? ?TAKE these medications   ? ?acetaminophen 325 MG tablet ?Commonly known as: TYLENOL ?Take 650 mg by mouth every morning. ?What changed: Another medication with the same name was removed. Continue taking this medication, and follow the directions you see here. ?  ?alum & mag hydroxide-simeth 200-200-20 MG/5ML suspension ?Commonly known as: MAALOX/MYLANTA ?Take 30 mLs by mouth daily as needed for indigestion or heartburn. ?  ?Aquaphor Advanced Therapy Oint ?Apply 1 application. topically daily. Spread topically to bilateral lower extremities. ?  ?aspirin EC 81 MG tablet ?Take 81 mg by mouth daily. ?  ?cholecalciferol 10 MCG (400 UNIT) Tabs tablet ?Commonly known as: VITAMIN D3 ?Take 2 tablets (800 Units total) by mouth daily. ?  ?DULoxetine 30 MG capsule ?Commonly known as: CYMBALTA ?Take 30 mg by mouth daily. ?  ?feeding supplement Liqd ?Take 237 mLs by mouth 2 (two) times daily between meals. ?  ?folic acid 1 MG tablet ?Commonly known as: FOLVITE ?Take 1 tablet (1 mg total) by mouth daily. ?  ?gabapentin 300 MG capsule ?Commonly known as: NEURONTIN ?Take 300 mg by mouth 2 (two) times daily. ?  ?guaiFENesin 100 MG/5ML liquid ?Commonly known as:  ROBITUSSIN ?Take 10 mLs by mouth every 6 (six) hours as needed for cough or to loosen phlegm. ?  ?latanoprost 0.005 % ophthalmic solution ?Commonly known as: XALATAN ?Place 1 drop into both eyes at bedtime. ?  ?levETIRAcetam 750 MG tablet ?Commonly known as: KEPPRA ?Take 750 mg by mouth 2 (two) times daily. ?  ?lisinopril 10 MG tablet ?Commonly known as: ZESTRIL ?Take 10 mg by mouth daily. ?  ?loperamide 2 MG capsule ?Commonly known as: IMODIUM ?Take 2 mg by mouth daily as needed for diarrhea or loose stools. Not to exceed 8 doses in 24 hours ?  ?magnesium hydroxide 400 MG/5ML suspension ?Commonly known as: MILK OF MAGNESIA ?Take 30 mLs by mouth at bedtime as needed for mild constipation. ?  ?Menthol (Topical Analgesic) 4 % Gel ?Apply 1 application. topically in the morning and at bedtime. Apply to both shoulders ?  ?multivitamin with minerals Tabs tablet ?Take 1 tablet by mouth daily. ?  ?ondansetron 4 MG tablet ?Commonly known as: ZOFRAN ?Take 4 mg by mouth every 6 (six) hours as needed for nausea or vomiting. ?  ?phenytoin 100 MG ER capsule ?Commonly known as: DILANTIN ?Take 1 capsule (100 mg total) by mouth daily. ?What changed:  ?how much to take ?when to take this ?  ?thiamine 100 MG tablet ?Take 1 tablet (100 mg total) by mouth daily. ?  ?traZODone 100 MG tablet ?Commonly known as: DESYREL ?Take 100 mg by mouth at bedtime. ?  ?Triple Antibiotic [phone] Oint ?  Apply 1 application. topically daily as needed (minor skin tears or abrasions). ?  ?Zinc Oxide 12 % Crea ?Apply 1 application. topically See admin instructions. Every shift for dermatitis ?  ?zonisamide 50 MG capsule ?Commonly known as: ZONEGRAN ?Take 3 capsules (150 mg total) by mouth daily. ?What changed:  ?how much to take ?when to take this ?  ? ?  ? ? ?Disposition and follow-up:   ?Mr.[name] was discharged from [facility] in Good condition.  At the hospital follow up visit please address: ? ?1.  Phenytoin toxicity:  Restarted on 100 mg phenytoin at DC (down from his home dose of 300 mg daily). Please address: Altering Phenytoin dose, nutrition with or without supplementation, alcohol use ? ?2.  Labs / imaging needed at time of follow-up: Phenytoin levels ? ?3.  Pending labs/ test needing follow-up: None ? ?Follow-up Appointments: ? ? ?Hospital Course by problem list: ? ?Acute metabolic encephalopathy  ?Dilantin toxicity ?Poor po intake ?History of seizures ?Severe protein calorie malnutrition ?The patient presents here after sustaining an unwitnessed fall and also presented with symptoms of dizziness and nausea/vomiting.  Review of records shows that he has presented to the ED multiple times in the past with Dilantin toxicity. He also admits to poor p.o. intake in the past few days especially. Unclear exactly how much alcohol the patient drinks.  ?In the ED, his mental status was noted to be at baseline, with some soft blood pressures but pt was otherwise hemodynamically stable. 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 Dilantin levels on admit were noted to be at 33.5.  Potassium was initially noted to be at 6.2 though repeat was 4.3. UA with >50 WBCs, small leukocytes, and many bacteria. Trauma imaging was negative. AMS was thought to be from a combination of possible factors, including dehydration, phenytoin toxicity, possible UTI though denies urinary symptoms so held off on tx with abx. ?Throughout his hospitalization, his phenytoin levels were monitored and supportive care with PRN boluses was provided until his phenytoin levels steadily trended downward <30. His phenytoin levels reached a normal range by day of discharge. Due to his severe protein calorie malnutrition, we consulted RD who recommended Ensure supplementation. Toxicity likely 2/2 poor po intake in the setting of severe malnutrition. Otherwise, his home keppra regimen was continued while his phenytoin was held, and on day of  discharge he was started on dilantin 100 mg daily.  ? ? Phenytoin Lvl  ?Latest Ref Rng 10.0 - 20.0 ug/mL  ?12/06/2021 ?1:24 PM 33.5 (HH)   ?12/06/2021 ?6:50 PM 33.9 (HH)   ?12/07/2021 ?6:33 AM 33.9 (HH)   ?12/07/2021 ?12:43 PM 32.5 (HH)   ?12/07/2021 ?8:29 PM 30.3 (HH)    ?12/08/2021 ?12:39 AM 35.4 (HH)   ?12/08/2021 ?3:37 PM 27.7 (H)   ?12/09/2021 ?2:17 AM 25.2 (H)   ?12/10/2021 ?1:07 AM 17.0   ? 12/11/21:                  9.8 ? ?H/o Hypertension ?The patient is on lisinopril 10 mg daily at home for his hypertension. His home lisinopril was held in the setting of lower blood pressures.  ?  ?Microcytic anemia ?Hemoglobin on admit of 7, his hemoglobin was noted to be about 7.5-8 a year ago. No mention of overt bleeding by patient or staff from his facility.  Patient has received iron supplementation in the past. His iron levels during this admission are 23, ferritin is 1100,  hemoglobin improved to 7.5 on hospital day 1. 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 His CBC was trended throughout this admission.  ?  ?Alcohol use ?Unclear exactly how much alcohol the patient drinks, but at one point does state that he drinks about 1/5 of vodka and drinks wine. Also states that he tends to have seizures when he drinks.  He was placed on CIWA without Ativan, and folic acid and thiamine supplementation was given during his hospitalization.  Ethanol levels were unremarkable on admit. ? ?Discharge Exam:   ?BP (!) 160/67 (BP Location: Left Arm)   Pulse (!) 119   Temp 99.5 ?F (37.5 ?C) (Oral)   Resp 18   Ht  (1.651 m)   Wt 47.6 kg   SpO2 (!) 86%   BMI 17.47 kg/m?  ?Discharge exam:  ?Constitutional: thin, chronically ill-appearing and lying in bed, in no acute distress ?HENT: normocephalic atraumatic ?Eyes: pupils equal and round, conjunctiva non-erythematous ?Cardiovascular: regular rate with normal rhythm, no m/r/g ?Pulmonary/Chest: normal work of breathing on room air, lungs clear to auscultation bilaterally ?Abdominal: soft, non-tender,  non-distended, bowel sounds present ?MSK: decreased bulk with normal tone ?Skin: warm and dry. ?Neurological: Alert and answering questions. Mental status is at baseline. No focal deficits on exam. No nystagmus. ?Psych:

## 2021-12-10 NOTE — Progress Notes (Signed)
[place] [phone] [facility]) - Hospitalized Hospice Patient  ? ? This is a current [place] patient from [place] with a terminal hospice diagnosis of Protein Calorie Malnutrition. Patient was transported to the [facility] ED on 12/06/21 for evaluation of unwitnessed fall with possible head trauma/diarrhea/vomiting per the nursing home staff. Facility unable to reach family so elected to call EMS to evaluate patient condition after discussing situation with [facility] MD.  He is admitted for acute metabolic encephalopathy. Patient is a Full Code.  Per Dr. Gildardo Cranker, an [facility] Physician, this is a related admission. ? ?No acute symptoms noted today, patient reports feeling ok today.  ?Patient is inpatient appropriate due to need for skilled nursing level of care, IV ferric gluconate  ? ?Vital Signs-  98.7/83/18   131/69   spO2 100% room air ?Intake/Output - 450/650 ?Abnormal labs-  Ca+ 8.7, RBC 2.86, Hgb 7.1, Hct 20.1 ?Diagnostics-  none new ?IV/PRN Meds- Ferric Gluconate 250mg  IV daily ?Problem List ?Principal Problem: ?  Acute metabolic encephalopathy ?Active Problems: ?  Phenytoin toxicity ?  Protein-calorie malnutrition, severe ? ?Discharge Planning- Likely d/c back to facility Monday ?Family Contact- None ?IDT- Updated ?Goals of care - Clear, patient wishes to be kept comfortable ? ?Should patient need ambulance transfer at discharge please use [facility]) as they contract this service for our active hospice patients.  ?Hospital transfer and medication list placed on patient's hard chart.  ?Jhonnie Garner, Therapist, sports, BSN, WTA-C ?[facility] Liaison ?[phone] ?

## 2021-12-11 ENCOUNTER — Other Ambulatory Visit: Payer: Self-pay | Admitting: Internal Medicine

## 2021-12-11 DIAGNOSIS — T420X1A Poisoning by hydantoin derivatives, accidental (unintentional), initial encounter: Secondary | ICD-10-CM

## 2021-12-11 LAB — PHENYTOIN LEVEL, FREE AND TOTAL
Phenytoin, Free: 2.6 ug/mL — ABNORMAL HIGH (ref 1.0–2.0)
Phenytoin, Total: 31.7 ug/mL — ABNORMAL HIGH (ref 10.0–20.0)

## 2021-12-11 LAB — PHENYTOIN LEVEL, TOTAL: Phenytoin Lvl: 9.8 ug/mL — ABNORMAL LOW (ref 10.0–20.0)

## 2021-12-11 MED ORDER — THIAMINE HCL 100 MG PO TABS: 100.0000 mg | ORAL_TABLET | Freq: Every day | ORAL | 0 refills | Status: AC

## 2021-12-11 MED ORDER — PHENYTOIN SODIUM EXTENDED 100 MG PO CAPS: 100.0000 mg | ORAL_CAPSULE | Freq: Every day | ORAL | 0 refills | Status: AC

## 2021-12-11 MED ORDER — CHOLECALCIFEROL 10 MCG (400 UNIT) PO TABS: 800.0000 [IU] | ORAL_TABLET | Freq: Every day | ORAL | 0 refills | Status: AC

## 2021-12-11 MED ORDER — ADULT MULTIVITAMIN W/MINERALS CH: 1.0000 | ORAL_TABLET | Freq: Every day | ORAL | 0 refills | Status: AC

## 2021-12-11 MED ORDER — FOLIC ACID 1 MG PO TABS: 1.0000 mg | ORAL_TABLET | Freq: Every day | ORAL | 0 refills | Status: AC

## 2021-12-11 MED ORDER — PHENYTOIN SODIUM EXTENDED 100 MG PO CAPS
100.0000 mg | ORAL_CAPSULE | Freq: Every day | ORAL | Status: DC
  Administered 2021-12-11: 100 mg via ORAL
  Filled 2021-12-11: qty 1

## 2021-12-11 MED ORDER — ENSURE ENLIVE PO LIQD: 237.0000 mL | Freq: Two times a day (BID) | ORAL | 12 refills | Status: AC

## 2021-12-11 NOTE — TOC Transition Note (Signed)
Transition of Care (TOC) - CM/SW Discharge Note ? ? ?Patient Details  ?Name: [name] ?MRN: [id number] ?Date of Birth: 1945-10-22 ? ?Transition of Care (TOC) CM/SW Contact:  ?Jimmy Picket, LCSW ?Phone Number: ?12/11/2021, 2:49 PM ? ? ?Clinical Narrative:    ? ?Per MD patient ready for DC to [facility]. RN, patient, patient's family, and facility notified of DC. Discharge Summary and FL2 sent to facility. DC packet on chart.   [facility] transport requested for patient.  ?  ?RN to call report to [phone]. ? ?CSW will sign off for now as social work intervention is no longer needed. Please consult [place] again if new needs arise. ? ? ? ?Final next level of care: Skilled Nursing Facility ?Barriers to Discharge: Barriers Resolved ? ? ?Patient Goals and CMS Choice ?Patient states their goals for this hospitalization and ongoing recovery are:: Pt unable to verbalize goals ?CMS Medicare.gov Compare Post Acute Care list provided to:: Patient ?Choice offered to / list presented to : Patient, NA ? ?Discharge Placement ?  ?           ?Patient chooses bed at: [facility] ?Patient to be transferred to facility by: [facility] ?  ?Patient and family notified of of transfer: 12/11/21 ? ?Discharge Plan and Services ?  ?  ?           ?  ?  ?  ?  ?  ?  ?  ?  ?  ?  ? ?Social Determinants of Health (SDOH) Interventions ?  ? ? ?Readmission Risk Interventions ?   ? View : No data to display.  ?  ?  ?  ? ? ?Jimmy Picket, LCSW ?Clinical Social Worker ? ? ? ?

## 2021-12-11 NOTE — Discharge Instructions (Signed)
You were seen in the hospital because the levels of one of your medications (dilantin) in your blood was too high. We gave you fluids and provided supportive care until these levels decreased to a normal range. You were previously on 300 mg of dilantin daily, but today we are discharging you on 100 mg of dilantin daily. Please discuss this further with your primary care provider, and they can determine whether this dosage needs to be adjusted.  ? ?Also, it will be very important to make sure you eat plenty of food! This will help to keep the anti-seizure medication levels in your blood in check. We are also discharging you on some vitamins and some feeding supplements to help with your nutrition. Follow-up with your primary care doctor within the week to discuss your recent hospitalization.  If any symptoms change or worsen acutely, please return to the nearest emergency department.  ? ? ?

## 2021-12-11 NOTE — Care Management Important Message (Signed)
Important Message ? ?Patient Details  ?Name: [name] ?MRN: [id number] ?Date of Birth: 19-Jul-1946 ? ? ?Medicare Important Message Given:  Yes ? ? ? ? ?Deiondre Harrower ?12/11/2021, 3:51 PM ?

## 2021-12-11 NOTE — Progress Notes (Addendum)
[facility] [phone] [facility]) - Hospitalized Hospice Patient  ?  ? This is a current [facility]osp[facility] patient from [facility] with a terminal hospice diagnosis of Protein Calorie Malnutrition. Patient was transported to the [facility] ED on 12/06/21 for evaluation of unwitnessed fall with possible head trauma/diarrhea/vomiting per the nursing home staff. Facility unable to reach family so elected to call EMS to evaluate patient condition after discussing situation with [facility] MD.  He is admitted for acute metabolic encephalopathy. Patient is a Full Code.  Per Dr. Kirt Boys, an [facility] Physician, this is a related admission. ?  ?Visited at bedside, patient was laid on his side curled up. He stated he was not in any pain. He is ready to go back to his facility, which is today. Patient did eat half of his breakfast.  ? ?Patient is inpatient appropriate due to need for skilled nursing level of care, monitoring for ETOH withdrawal side effects. Monitoring Phenytoin levels.  ?  ?Vital Signs-  98.7, 131/63, HR 84, spO2 100% room air ?Intake/Output - ? ?Abnormal labs-   ? Latest Reference Range & Units 12/11/21 02:06  ?Phenytoin Lvl 10.0 - 20.0 ug/mL 9.8 (L)  ? ? ?Diagnostics-  none new ?IV/PRN Meds-  ? ? ?Problem List ?Principal Problem: ?  Acute metabolic encephalopathy ?Active Problems: ?  Phenytoin toxicity ?  Protein-calorie malnutrition, severe ?  ?Discharge Planning- D/C back to [facility] ?Family Contact- None ?IDT- Updated ?Goals of care - Clear, patient wishes to be kept comfortable ?  ?Should patient need ambulance transfer at discharge please use [facility]) as they contract this service for our active hospice patients.  ? ?Hospital transfer and medication list placed on patient's hard chart.  ? ?Yolande Jolly, BSN, RN ?[facility] Liaison ?[phone] ? ? ? ? ? ? ?

## 2021-12-11 NOTE — [place] FL2 (Signed)
?[place] MEDICAID FL2 LEVEL OF CARE SCREENING TOOL  ?  ? ?IDENTIFICATION  ?Patient Name: ?[name] Birthdate: 04-Aug-1946 Sex: male Admission Date (Current Location): ?12/06/2021  ?[place] Number: ? [place] ?  Facility and Address:  ?[facility] [place], [address], [place], [place] [address] ?     Provider Number: ?[id number]  ?Attending Physician Name and Address:  ?Gust Rung, DO ? Relative Name and Phone Number:  ?  ?   ?Current Level of Care: ?Hospital Recommended Level of Care: ?Assisted Living Facility Prior Approval Number: ?  ? ?Date Approved/Denied: ?  PASRR Number: ?  ? ?Discharge Plan: ?Other (Comment) (ALF) ?  ? ?Current Diagnoses: ?Patient Active Problem List  ? Diagnosis Date Noted  ? Protein-calorie malnutrition, severe 12/08/2021  ? Phenytoin toxicity 12/07/2021  ? Acute metabolic encephalopathy 12/06/2021  ? Syncope 06/28/2020  ? Pressure injury of skin 06/28/2020  ? Seizure (HCC) 10/22/2016  ? Essential hypertension 10/22/2016  ? Microcytic anemia 10/22/2016  ? Colon cancer screening 12/15/2014  ? 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 Seizure disorder (HCC) 05/20/2013  ? Hemiparesis affecting dominant side as late effect of stroke (HCC) 05/20/2013  ? Tobacco use disorder 05/20/2013  ? ? ?Orientation RESPIRATION BLADDER Height & Weight   ?  ?Self ? Normal Indwelling catheter, Incontinent Weight: 105 lb (47.6 kg) ?Height:  5\' 5"  (165.1 cm)  ?BEHAVIORAL SYMPTOMS/MOOD NEUROLOGICAL BOWEL NUTRITION STATUS  ?    Incontinent Diet  ?AMBULATORY STATUS COMMUNICATION OF NEEDS Skin   ?Total Care Verbally Normal ?  ?  ?  ?    ?     ?     ? ? ?Personal Care Assistance Level of Assistance  ?Bathing, Feeding, Dressing Bathing Assistance: Maximum assistance ?Feeding assistance: Limited assistance ?Dressing Assistance: Maximum assistance ?   ? ?Functional Limitations Info  ?    ?  ?   ? ? ?SPECIAL CARE FACTORS FREQUENCY  ?    ?  ?  ?  ?  ?  ?  ?   ? ? ?Contractures Contractures  Info: Not present  ? ? ?Additional Factors Info  ?Code Status, Allergies Code Status Info: full ?Allergies Info: Carbamazepine   Tricyclic Antidepressants ?  ?  ?  ?   ? ?Current Medications (12/11/2021):  This is the current hospital active medication list ?Current Facility-Administered Medications  ?Medication Dose Route Frequency Provider Last Rate Last Admin  ? acetaminophen (TYLENOL) tablet 650 mg  650 mg Oral Q6H PRN 02/10/2022, MD      ? Or  ? acetaminophen (TYLENOL) suppository 650 mg  650 mg Rectal Q6H PRN Marolyn Haller, MD      ? aspirin EC tablet 81 mg  81 mg Oral Daily Marolyn Haller, MD   81 mg at 12/11/21 1053  ? cholecalciferol (VITAMIN D3) tablet 800 Units  800 Units Oral Daily 02/10/22, MD   800 Units at 12/11/21 1054  ? dextrose 50 % solution 50 mL  1 ampule Intravenous Once 02/10/22, MD      ? DULoxetine (CYMBALTA) DR capsule 30 mg  30 mg Oral Daily Marolyn Haller, MD   30 mg at 12/11/21 1054  ? enoxaparin (LOVENOX) injection 30 mg  30 mg Subcutaneous Q24H 02/10/22 C, DO   30 mg at 12/10/21 2048  ? feeding supplement (ENSURE ENLIVE / ENSURE PLUS) liquid 237 mL  237 mL Oral BID BM Hoffman, Erik C, DO   237 mL  at 12/11/21 1054  ? ferric gluconate (FERRLECIT) 250 mg in sodium chloride 0.9 % 250 mL IVPB  250 mg Intravenous Daily Andrey CampanileBonanno, Marianne E, MD 135 mL/hr at 12/11/21 1100 250 mg at 12/11/21 1100  ? folic acid (FOLVITE) tablet 1 mg  1 mg Oral Daily Carlynn PurlHoffman, Erik C, DO   1 mg at 12/11/21 1054  ? levETIRAcetam (KEPPRA) tablet 750 mg  750 mg Oral BID Marolyn Hallerarter, [place], MD   750 mg at 12/11/21 1054  ? multivitamin with minerals tablet 1 tablet  1 tablet Oral Daily Gust RungHoffman, Erik C, DO   1 tablet at 12/11/21 1053  ? ondansetron (ZOFRAN) tablet 4 mg  4 mg Oral Q6H PRN Marolyn Hallerarter, [place], MD      ? Or  ? ondansetron [facility](ZOFRAN) injection 4 mg  4 mg Intravenous Q6H PRN Marolyn Hallerarter, [place], MD      ? phenytoin (DILANTIN) ER capsule 100 mg  100 mg Oral Daily Andrey CampanileBonanno,  Marianne E, MD   100 mg at 12/11/21 1100  ? thiamine tablet 100 mg  100 mg Oral Daily Carlynn PurlHoffman, Erik C, DO   100 mg at 12/11/21 1054  ? ? ? ?Discharge Medications: ?Medication List  ?   ?  ?STOP taking these medications   ?  ?ferrous sulfate 325 (65 FE) MG tablet ?   ?potassium chloride SA 20 MEQ tablet ?Commonly known as: KLOR-CON M ?   ?UNABLE TO FIND ?   ?  ?   ?  ?TAKE these medications   ?  ?acetaminophen 325 MG tablet ?Commonly known as: TYLENOL ?Take 650 mg by mouth every morning. ?What changed: Another medication with the same name was removed. Continue taking this medication, and follow the directions you see here. ?   ?alum & mag hydroxide-simeth 200-200-20 MG/5ML suspension ?Commonly known as: MAALOX/MYLANTA ?Take 30 mLs by mouth daily as needed for indigestion or heartburn. ?   ?Aquaphor Advanced Therapy Oint ?Apply 1 application. topically daily. Spread topically to bilateral lower extremities. ?   ?aspirin EC 81 MG tablet ?Take 81 mg by mouth daily. ?   ?cholecalciferol 10 MCG (400 UNIT) Tabs tablet ?Commonly known as: VITAMIN D3 ?Take 2 tablets (800 Units total) by mouth daily. ?   ?DULoxetine 30 MG capsule ?Commonly known as: CYMBALTA ?Take 30 mg by mouth daily. ?   ?feeding supplement Liqd ?Take 237 mLs by mouth 2 (two) times daily between meals. ?   ?folic acid 1 MG tablet ?Commonly known as: FOLVITE ?Take 1 tablet (1 mg total) by mouth daily. ?   ?gabapentin 300 MG capsule ?Commonly known as: NEURONTIN ?Take 300 mg by mouth 2 (two) times daily. ?   ?guaiFENesin 100 MG/5ML liquid ?Commonly known as: ROBITUSSIN ?Take 10 mLs by mouth every 6 (six) hours as needed for cough or to loosen phlegm. ?   ?latanoprost 0.005 % ophthalmic solution ?Commonly known as: XALATAN ?Place 1 drop into both eyes at bedtime. ?   ?levETIRAcetam 750 MG tablet ?Commonly known as: KEPPRA ?Take 750 mg by mouth 2 (two) times daily. ?   ?lisinopril 10 MG tablet ?Commonly known as: ZESTRIL ?Take 10 mg by mouth daily. ?    ?loperamide 2 MG capsule ?Commonly known as: IMODIUM ?Take 2 mg by mouth daily as needed for diarrhea or loose stools. Not to exceed 8 doses in 24 hours ?   ?magnesium hydroxide 400 MG/5ML suspension ?Commonly known as: MILK OF MAGNESIA ?Take 30 mLs by mouth at bedtime as needed for mild constipation. ?   ?  Menthol (Topical Analgesic) 4 % Gel ?Apply 1 application. topically in the morning and at bedtime. Apply to both shoulders ?   ?multivitamin with minerals Tabs tablet ?Take 1 tablet by mouth daily. ?   ?ondansetron 4 MG tablet ?Commonly known as: ZOFRAN ?Take 4 mg by mouth every 6 (six) hours as needed for nausea or vomiting. ?   ?phenytoin 100 MG ER capsule ?Commonly known as: DILANTIN ?Take 1 capsule (100 mg total) by mouth daily. ?What changed:  ?how much to take ?when to take this ?   ?thiamine 100 MG tablet ?Take 1 tablet (100 mg total) by mouth daily. ?   ?traZODone 100 MG tablet ?Commonly known as: DESYREL ?Take 100 mg by mouth at bedtime. ?   ?Triple Antibiotic [phone] Oint ?Apply 1 application. topically daily as needed (minor skin tears or abrasions). ?   ?Zinc Oxide 12 % Crea ?Apply 1 application. topically See admin instructions. Every shift for dermatitis ?   ?zonisamide 50 MG capsule ?Commonly known as: ZONEGRAN ?Take 3 capsules (150 mg total) by mouth daily. ?What changed:  ?how much to take ?when to take this  ? ? ?Relevant Imaging Results: ? ?Relevant Lab Results: ? ? ?Additional Information ?SSN: [id number] ? ?Jimmy Picket, LCSW ? ? ? ? ?

## 2021-12-13 ENCOUNTER — Telehealth: Payer: Self-pay

## 2021-12-13 NOTE — Telephone Encounter (Signed)
Pt was seen in the hospital; discharge note per Dr Alroy Bailiff, I will ask her to clarify pharmacy's request. ?

## 2021-12-13 NOTE — Telephone Encounter (Signed)
[name] with [facility] requesting to speak with a nurse to clarify the direction and quantity of Ensure plus. Please call back @ [phone], opt 2 reference # [id number].  ?

## 2022-10-07 ENCOUNTER — Emergency Department (HOSPITAL_COMMUNITY)
Admission: EM | Admit: 2022-10-07 | Discharge: 2022-10-07 | Disposition: A | Discharge status: Home / Self Care | Payer: Medicare Other | Attending: Emergency Medicine | Admitting: Emergency Medicine

## 2022-10-07 ENCOUNTER — Emergency Department (HOSPITAL_COMMUNITY): Payer: Medicare Other

## 2022-10-07 ENCOUNTER — Other Ambulatory Visit: Payer: Self-pay

## 2022-10-07 DIAGNOSIS — R410 Disorientation, unspecified: Secondary | ICD-10-CM | POA: Diagnosis not present

## 2022-10-07 DIAGNOSIS — W19XXXA Unspecified fall, initial encounter: Secondary | ICD-10-CM

## 2022-10-07 DIAGNOSIS — Z7901 Long term (current) use of anticoagulants: Secondary | ICD-10-CM | POA: Insufficient documentation

## 2022-10-07 DIAGNOSIS — S01112A Laceration without foreign body of left eyelid and periocular area, initial encounter: Secondary | ICD-10-CM | POA: Diagnosis not present

## 2022-10-07 DIAGNOSIS — Z7982 Long term (current) use of aspirin: Secondary | ICD-10-CM | POA: Insufficient documentation

## 2022-10-07 DIAGNOSIS — W06XXXA Fall from bed, initial encounter: Secondary | ICD-10-CM | POA: Diagnosis not present

## 2022-10-07 DIAGNOSIS — R519 Headache, unspecified: Secondary | ICD-10-CM | POA: Diagnosis present

## 2022-10-07 DIAGNOSIS — S0181XA Laceration without foreign body of other part of head, initial encounter: Secondary | ICD-10-CM

## 2022-10-07 DIAGNOSIS — I6782 Cerebral ischemia: Secondary | ICD-10-CM | POA: Diagnosis not present

## 2022-10-07 LAB — PHENYTOIN LEVEL, TOTAL: Phenytoin Lvl: 3.3 ug/mL — ABNORMAL LOW (ref 10.0–20.0)

## 2022-10-07 LAB — SAMPLE TO BLOOD BANK

## 2022-10-07 LAB — CBC
HCT: 25.4 % — ABNORMAL LOW (ref 39.0–52.0)
Hemoglobin: 8.3 g/dL — ABNORMAL LOW (ref 13.0–17.0)
MCH: 23.6 pg — ABNORMAL LOW (ref 26.0–34.0)
MCHC: 32.7 g/dL (ref 30.0–36.0)
MCV: 72.4 fL — ABNORMAL LOW (ref 80.0–100.0)
Platelets: 297 10*3/uL (ref 150–400)
RBC: 3.51 MIL/uL — ABNORMAL LOW (ref 4.22–5.81)
RDW: 18.1 % — ABNORMAL HIGH (ref 11.5–15.5)
WBC: 9.7 10*3/uL (ref 4.0–10.5)
nRBC: 0 % (ref 0.0–0.2)

## 2022-10-07 LAB — COMPREHENSIVE METABOLIC PANEL
ALT: 11 U/L (ref 0–44)
AST: 20 U/L (ref 15–41)
Albumin: 3.6 g/dL (ref 3.5–5.0)
Alkaline Phosphatase: 113 U/L (ref 38–126)
Anion gap: 10 (ref 5–15)
BUN: 21 mg/dL (ref 8–23)
CO2: 23 mmol/L (ref 22–32)
Calcium: 9.2 mg/dL (ref 8.9–10.3)
Chloride: 107 mmol/L (ref 98–111)
Creatinine, Ser: 0.9 mg/dL (ref 0.61–1.24)
GFR, Estimated: >60 mL/min (ref >60)
Glucose, Bld: 115 mg/dL — ABNORMAL HIGH (ref 70–99)
Potassium: 4.1 mmol/L (ref 3.5–5.1)
Sodium: 140 mmol/L (ref 135–145)
Total Bilirubin: 0.7 mg/dL (ref 0.3–1.2)
Total Protein: 7.2 g/dL (ref 6.5–8.1)

## 2022-10-07 LAB — ETHANOL: Alcohol, Ethyl (B): <10 mg/dL (ref <10)

## 2022-10-07 LAB — I-STAT CHEM 8, ED
BUN: 29 mg/dL — ABNORMAL HIGH (ref 8–23)
Calcium, Ion: 1.1 mmol/L — ABNORMAL LOW (ref 1.15–1.40)
Chloride: 111 mmol/L (ref 98–111)
Creatinine, Ser: 0.8 mg/dL (ref 0.61–1.24)
Glucose, Bld: 109 mg/dL — ABNORMAL HIGH (ref 70–99)
HCT: 28 % — ABNORMAL LOW (ref 39.0–52.0)
Hemoglobin: 9.5 g/dL — ABNORMAL LOW (ref 13.0–17.0)
Potassium: 6 mmol/L — ABNORMAL HIGH (ref 3.5–5.1)
Sodium: 138 mmol/L (ref 135–145)
TCO2: 23 mmol/L (ref 22–32)

## 2022-10-07 LAB — PROTIME-INR
INR: 1.2 (ref 0.8–1.2)
Prothrombin Time: 15.5 seconds — ABNORMAL HIGH (ref 11.4–15.2)

## 2022-10-07 LAB — LACTIC ACID, PLASMA
Lactic Acid, Venous: 2.7 mmol/L (ref 0.5–1.9)
Lactic Acid, Venous: 1.8 mmol/L (ref 0.5–1.9)

## 2022-10-07 MED ORDER — LIDOCAINE-EPINEPHRINE-TETRACAINE (LET) TOPICAL GEL
3.0000 mL | Freq: Once | TOPICAL | Status: AC
  Administered 2022-10-07: 3 mL via TOPICAL
  Filled 2022-10-07: qty 3

## 2022-10-07 NOTE — ED Notes (Signed)
Patient transported to CT with TRN.  

## 2022-10-07 NOTE — ED Notes (Signed)
Patient incontinent, brief changed at this time.

## 2022-10-07 NOTE — ED Provider Notes (Signed)
[facility] Provider Note   CSN: [id number] Arrival date & time:        History  Chief Complaint  Patient presents with   Fall   Head Injury    [name] is a 77 y.o. male.  The history is provided by the patient, the EMS personnel and medical records.  Fall  Head Injury [name] is a 77 y.o. male who presents to the Emergency Department complaining of fall.  He presents to the emergency department by EMS from [facility] following an unwitnessed fall.  Level 5 caveat due to confusion.  EMS reports that patient is alert at his baseline, oriented to self.  They state that staff was in the room and then returned and found him on the floor.  Patient states that he was adjusting himself in the bed and fell out of bed.  He complains of pain to his head.  No additional complaints.  According to records he takes warfarin.     Home Medications Prior to Admission medications   Medication Sig Start Date End Date Taking? Authorizing Provider  acetaminophen (TYLENOL) 325 MG tablet Take 650 mg by mouth every morning.    [provider]  alum & mag hydroxide-simeth (MAALOX/MYLANTA) 200-200-20 MG/5ML suspension Take 30 mLs by mouth daily as needed for indigestion or heartburn.    [provider]  aspirin EC 81 MG tablet Take 81 mg by mouth daily.    [provider]  cholecalciferol (VITAMIN D3) 10 MCG (400 UNIT) TABS tablet Take 2 tablets (800 Units total) by mouth daily. 12/11/21   Orvis Brill, MD  DULoxetine (CYMBALTA) 30 MG capsule Take 30 mg by mouth daily.    [provider]  Emollient (AQUAPHOR ADVANCED THERAPY) OINT Apply 1 application. topically daily. Spread topically to bilateral lower extremities.    [provider]  feeding supplement (ENSURE ENLIVE / ENSURE PLUS) LIQD Take 237 mLs by mouth 2 (two) times daily between meals. 12/11/21   Orvis Brill, MD  folic  acid (FOLVITE) 1 MG tablet Take 1 tablet (1 mg total) by mouth daily. 12/11/21   Orvis Brill, MD  gabapentin (NEURONTIN) 300 MG capsule Take 300 mg by mouth 2 (two) times daily.    [provider]  guaiFENesin (ROBITUSSIN) 100 MG/5ML liquid Take 10 mLs by mouth every 6 (six) hours as needed for cough or to loosen phlegm.    [provider]  latanoprost (XALATAN) 0.005 % ophthalmic solution Place 1 drop into both eyes at bedtime.    [provider]  levETIRAcetam (KEPPRA) 750 MG tablet Take 750 mg by mouth 2 (two) times daily.    [provider]  lisinopril (ZESTRIL) 10 MG tablet Take 10 mg by mouth daily.    [provider]  loperamide (IMODIUM) 2 MG capsule Take 2 mg by mouth daily as needed for diarrhea or loose stools. Not to exceed 8 doses in 24 hours    [provider]  magnesium hydroxide (MILK OF MAGNESIA) 400 MG/5ML suspension Take 30 mLs by mouth at bedtime as needed for mild constipation.    [provider]  Menthol, Topical Analgesic, 4 % GEL Apply 1 application. topically in the morning and at bedtime. Apply to both shoulders    [provider]  Multiple Vitamin (MULTIVITAMIN WITH MINERALS) TABS tablet Take 1 tablet by mouth daily. 12/11/21   Orvis Brill, MD  Neomycin-Bacitracin-Polymyxin (TRIPLE  ANTIBIOTIC) [phone] OINT Apply 1 application. topically daily as needed (minor skin tears or abrasions).    [provider]  ondansetron (ZOFRAN) 4 MG tablet Take 4 mg by mouth every 6 (six) hours as needed for nausea or vomiting.    [provider]  phenytoin (DILANTIN) 100 MG ER capsule Take 1 capsule (100 mg total) by mouth daily. 12/11/21   Orvis Brill, MD  thiamine 100 MG tablet Take 1 tablet (100 mg total) by mouth daily. 12/11/21   Orvis Brill, MD  traZODone (DESYREL) 100 MG tablet Take 100 mg by mouth at bedtime.    [provider]  Zinc Oxide 12 % CREA Apply 1  application. topically See admin instructions. Every shift for dermatitis    [provider]  zonisamide (ZONEGRAN) 50 MG capsule Take 3 capsules (150 mg total) by mouth daily. Patient taking differently: Take 100 mg by mouth in the morning and at bedtime. 10/23/16   Geradine Girt, DO      Allergies    Carbamazepine and Tricyclic antidepressants    Review of Systems   Review of Systems  All other systems reviewed and are negative.   Physical Exam Updated Vital Signs BP 124/82   Pulse 66   Temp (!) 97.4 F (36.3 C) (Oral)   Resp 12   Ht '5\' 5"'$  (1.651 m)   Wt 47 kg   SpO2 100%   BMI 17.24 kg/m  Physical Exam Vitals and nursing note reviewed.  Constitutional:      Appearance: He is well-developed.  HENT:     Head: Normocephalic.     Comments: Soft tissue swelling and ecchymosis to the left periorbital region with a laceration over the left eyebrow. Cardiovascular:     Rate and Rhythm: Normal rate and regular rhythm.     Heart sounds: No murmur heard. Pulmonary:     Effort: Pulmonary effort is normal. No respiratory distress.     Breath sounds: Normal breath sounds.  Abdominal:     Palpations: Abdomen is soft.     Tenderness: There is no abdominal tenderness. There is no guarding or rebound.  Musculoskeletal:        General: No swelling or tenderness.     Comments: No tenderness over the hips bilaterally  Skin:    General: Skin is warm and dry.  Neurological:     Mental Status: He is alert.     Comments: Oriented to person.  Disoriented to time and place.  Right upper extremity and right lower extremity weakness-patient states this is his baseline due to history of prior CVA.  Psychiatric:        Behavior: Behavior normal.     ED Results / Procedures / Treatments   Labs (all labs ordered are listed, but only abnormal results are displayed) Labs Reviewed  COMPREHENSIVE METABOLIC PANEL - Abnormal; Notable for the following components:      Result Value    Glucose, Bld 115 (*)    All other components within normal limits  CBC - Abnormal; Notable for the following components:   RBC 3.51 (*)    Hemoglobin 8.3 (*)    HCT 25.4 (*)    MCV 72.4 (*)    MCH 23.6 (*)    RDW 18.1 (*)    All other components within normal limits  LACTIC ACID, PLASMA - Abnormal; Notable for the following components:   Lactic Acid, Venous 2.7 (*)    All other components within  normal limits  PROTIME-INR - Abnormal; Notable for the following components:   Prothrombin Time 15.5 (*)    All other components within normal limits  PHENYTOIN LEVEL, TOTAL - Abnormal; Notable for the following components:   Phenytoin Lvl 3.3 (*)    All other components within normal limits  I-STAT CHEM 8, ED - Abnormal; Notable for the following components:   Potassium 6.0 (*)    BUN 29 (*)    Glucose, Bld 109 (*)    Calcium, Ion 1.10 (*)    Hemoglobin 9.5 (*)    HCT 28.0 (*)    All other components within normal limits  ETHANOL  LACTIC ACID, PLASMA  URINALYSIS, ROUTINE W REFLEX MICROSCOPIC  SAMPLE TO BLOOD BANK    EKG None  Radiology CT CERVICAL SPINE WO CONTRAST  Result Date: 10/07/2022 CLINICAL DATA:  Unwitnessed fall, on blood thinners EXAM: CT HEAD WITHOUT CONTRAST CT CERVICAL SPINE WITHOUT CONTRAST TECHNIQUE: Multidetector CT imaging of the head and cervical spine was performed following the standard protocol without intravenous contrast. Multiplanar CT image reconstructions of the cervical spine were also generated. RADIATION DOSE REDUCTION: This exam was performed according to the departmental dose-optimization program which includes automated exposure control, adjustment of the mA and/or kV according to patient size and/or use of iterative reconstruction technique. COMPARISON:  12/06/2021 FINDINGS: CT HEAD FINDINGS Brain: No evidence of acute infarction, hemorrhage, extra-axial collection or mass lesion/mass effect. Global cortical and central atrophy. Secondary ventricular  prominence. Subcortical white matter and periventricular small vessel ischemic changes. Vascular: Intracranial atherosclerosis. Skull: Normal. Negative for fracture or focal lesion. Right frontal craniectomy. Sinuses/Orbits: The visualized paranasal sinuses are essentially clear. The mastoid air cells are unopacified. Other: Moderate extracranial hematoma overlying the left frontal bone (series 3/image 14). CT CERVICAL SPINE FINDINGS Alignment: Normal cervical lordosis. Skull base and vertebrae: No acute fracture. No primary bone lesion or focal pathologic process. Soft tissues and spinal canal: No prevertebral fluid or swelling. No visible canal hematoma. Disc levels: Moderate degenerative changes of the mid/distal cervical spine. Spinal canal is patent. Upper chest: Visualized upper abdomen is notable for mild centrilobular and paraseptal emphysematous changes. Other: Visualized thyroid is unremarkable. IMPRESSION: Moderate extracranial hematoma overlying the left frontal bone. No evidence of calvarial fracture. No evidence of acute intracranial abnormality. Atrophy with small vessel ischemic changes. No traumatic injury to the cervical spine. Moderate degenerative changes. Electronically Signed   By: Julian Hy [name]   On: 10/07/2022 01:49   CT HEAD WO CONTRAST  Result Date: 10/07/2022 CLINICAL DATA:  Unwitnessed fall, on blood thinners EXAM: CT HEAD WITHOUT CONTRAST CT CERVICAL SPINE WITHOUT CONTRAST TECHNIQUE: Multidetector CT imaging of the head and cervical spine was performed following the standard protocol without intravenous contrast. Multiplanar CT image reconstructions of the cervical spine were also generated. RADIATION DOSE REDUCTION: This exam was performed according to the departmental dose-optimization program which includes automated exposure control, adjustment of the mA and/or kV according to patient size and/or use of iterative reconstruction technique. COMPARISON:  12/06/2021 FINDINGS:  CT HEAD FINDINGS Brain: No evidence of acute infarction, hemorrhage, extra-axial collection or mass lesion/mass effect. Global cortical and central atrophy. Secondary ventricular prominence. Subcortical white matter and periventricular small vessel ischemic changes. Vascular: Intracranial atherosclerosis. Skull: Normal. Negative for fracture or focal lesion. Right frontal craniectomy. Sinuses/Orbits: The visualized paranasal sinuses are essentially clear. The mastoid air cells are unopacified. Other: Moderate extracranial hematoma overlying the left frontal bone (series 3/image 14). CT CERVICAL SPINE FINDINGS Alignment: Normal cervical  lordosis. Skull base and vertebrae: No acute fracture. No primary bone lesion or focal pathologic process. Soft tissues and spinal canal: No prevertebral fluid or swelling. No visible canal hematoma. Disc levels: Moderate degenerative changes of the mid/distal cervical spine. Spinal canal is patent. Upper chest: Visualized upper abdomen is notable for mild centrilobular and paraseptal emphysematous changes. Other: Visualized thyroid is unremarkable. IMPRESSION: Moderate extracranial hematoma overlying the left frontal bone. No evidence of calvarial fracture. No evidence of acute intracranial abnormality. Atrophy with small vessel ischemic changes. No traumatic injury to the cervical spine. Moderate degenerative changes. Electronically Signed   By: Julian Hy [name]   On: 10/07/2022 01:49   DG Chest Port 1 View  Result Date: 10/07/2022 CLINICAL DATA:  Fall EXAM: PORTABLE CHEST 1 VIEW COMPARISON:  12/06/2021 FINDINGS: Hyperinflation. Heart and mediastinal contours are within normal limits. No focal opacities or effusions. No acute bony abnormality. No pneumothorax. IMPRESSION: No active cardiopulmonary disease.  Hyperinflation. Electronically Signed   By: Rolm Baptise [name]   On: 10/07/2022 01:23   DG Pelvis Portable  Result Date: 10/07/2022 CLINICAL DATA:  Fall EXAM: PORTABLE  PELVIS 1-2 VIEWS COMPARISON:  12/06/2021 FINDINGS: Mild symmetric degenerative changes in the hips. SI joints symmetric. No acute bony abnormality. Specifically, no fracture, subluxation, or dislocation. Vascular calcifications. IMPRESSION: No acute bony abnormality. Electronically Signed   By: Rolm Baptise [name]   On: 10/07/2022 01:21    Procedures .Marland KitchenLaceration Repair  Date/Time: 10/07/2022 3:20 AM  Performed by: Quintella Reichert, MD Authorized by: Quintella Reichert, MD   Consent:    Consent obtained:  Verbal   Consent given by:  Healthcare agent and patient Universal protocol:    Patient identity confirmed:  Verbally with patient Anesthesia:    Anesthesia method:  Topical application   Topical anesthetic:  LET Laceration details:    Location:  Face   Face location:  L eyebrow   Length (cm):  1.5 Pre-procedure details:    Preparation:  Patient was prepped and draped in usual sterile fashion Exploration:    Hemostasis achieved with:  LET   Wound exploration: wound explored through full range of motion   Treatment:    Area cleansed with:  Chlorhexidine and saline   Amount of cleaning:  Standard   Irrigation solution:  Sterile saline   Debridement:  None Skin repair:    Repair method:  Sutures   Suture size:  5-0   Wound skin closure material used: vicryl.   Number of sutures:  2 Approximation:    Approximation:  Close Repair type:    Repair type:  Simple Post-procedure details:    Procedure completion:  Tolerated well, no immediate complications     Medications Ordered in ED Medications  lidocaine-EPINEPHrine-tetracaine (LET) topical gel (3 mLs Topical Given 10/07/22 0216)    ED Course/ Medical Decision Making/ A&P                             Medical Decision Making Amount and/or Complexity of Data Reviewed Labs: ordered. Radiology: ordered.   Patient here as a level 2 trauma alert after fall out of bed on thinners.  He does have a history of prior CVA with  right-sided deficits.  He does have a left-sided facial hematoma with overlying laceration.  No evidence of ocular injury.  Wound repaired per note.  CT head, cervical spine is negative for acute abnormality.  Labs with stable anemia.  Discussed with patient's  sister over the phone patient's presentation and lab findings.  No reports of recent illnesses or complaints at the facility.  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  He did have an initially elevated lactate, recheck is within normal limits.  Initial result was likely a spurious result.  Feel he is stable for discharge back to facility.       Final Clinical Impression(s) / ED Diagnoses Final diagnoses:  Fall, initial encounter  Facial laceration, initial encounter    Rx / DC Orders ED Discharge Orders     None         Quintella Reichert, MD 10/07/22 [phone]

## 2022-10-07 NOTE — ED Triage Notes (Signed)
Pt arrived via [facility] for level 2 fall on thinners (coumadin) from [facility]. Pt suffered unwitnessed fall from bed. Staff report pt was last seen in bed 2330, found on ground next to bed at [facility]. Pt reports he rolled out of bed, possibly striking head on bed rail. Hematoma noted to left orbital are. Pt is alert and oriented at baseline (self, short term events, location). Ccollar in place. Pt reporting pain in head, neck.   PTA EMS Vitals BP 122/76 HR 58 SPO2 96% CBG 138

## 2022-10-07 NOTE — ED Notes (Signed)
Attempted to call [facility] x2 with no answer and no option to leave a voicemail.

## 2022-10-07 NOTE — Progress Notes (Signed)
Orthopedic Tech Progress Note Patient Details:  [name] [birth year] [id number]  Patient ID: [name], male   DOB: 05-Mar-1946, 77 y.o.   MRN: [id number] I attended trauma page Karolee Stamps 10/07/2022, 6:44 AM

## 2022-10-07 NOTE — Discharge Instructions (Addendum)
You had 2 stitches placed in your left eyebrow today.  They should dissolve on their own in the next 5 to 7 days.  If they do not dissolve please have your health care provider remove these.

## 2022-10-07 NOTE — Progress Notes (Signed)
   10/06/22 1243  Spiritual Encounters  Type of Visit Initial  Care provided to: Patient  Referral source Trauma page  Reason for visit Trauma  OnCall Visit Yes  Interventions  Spiritual Care Interventions Made Established relationship of care and support;Compassionate presence;Reflective listening  Intervention Outcomes  Outcomes Reduced anxiety;Reduced fear;Reduced isolation   Chaplain Merleen Picazo provided compassionate presence and reflective listening for patient. Patient appeared calm and did not request any further care.   Note Prepared by Abbott Pao, Chaplain Resident [phone]

## 2023-08-31 ENCOUNTER — Emergency Department (HOSPITAL_COMMUNITY): Payer: Medicare Other

## 2023-08-31 ENCOUNTER — Encounter (HOSPITAL_COMMUNITY): Payer: Self-pay

## 2023-08-31 ENCOUNTER — Emergency Department (HOSPITAL_COMMUNITY)
Admission: EM | Admit: 2023-08-31 | Discharge: 2023-09-01 | Disposition: A | Discharge status: Skilled Nursing Facility | Payer: Medicare Other | Attending: Emergency Medicine | Admitting: Emergency Medicine

## 2023-08-31 ENCOUNTER — Other Ambulatory Visit: Payer: Self-pay

## 2023-08-31 DIAGNOSIS — Z7982 Long term (current) use of aspirin: Secondary | ICD-10-CM | POA: Insufficient documentation

## 2023-08-31 DIAGNOSIS — Z20822 Contact with and (suspected) exposure to covid-19: Secondary | ICD-10-CM | POA: Diagnosis not present

## 2023-08-31 DIAGNOSIS — J9601 Acute respiratory failure with hypoxia: Secondary | ICD-10-CM | POA: Diagnosis not present

## 2023-08-31 DIAGNOSIS — R051 Acute cough: Secondary | ICD-10-CM

## 2023-08-31 DIAGNOSIS — R0602 Shortness of breath: Secondary | ICD-10-CM | POA: Diagnosis present

## 2023-08-31 DIAGNOSIS — I959 Hypotension, unspecified: Secondary | ICD-10-CM | POA: Diagnosis not present

## 2023-08-31 LAB — RESP PANEL BY RT-PCR (RSV, FLU A&B, COVID)  RVPGX2
Influenza A by PCR: NEGATIVE
Influenza B by PCR: NEGATIVE
Resp Syncytial Virus by PCR: NEGATIVE
SARS Coronavirus 2 by RT PCR: NEGATIVE

## 2023-08-31 MED ORDER — MORPHINE SULFATE 15 MG PO TABS: 15.0000 mg | ORAL_TABLET | ORAL | 0 refills | Status: AC | PRN

## 2023-08-31 MED ORDER — ALBUTEROL SULFATE HFA 108 (90 BASE) MCG/ACT IN AERS
2.0000 | INHALATION_SPRAY | RESPIRATORY_TRACT | Status: DC | PRN
  Filled 2023-08-31: qty 6.7

## 2023-08-31 NOTE — ED Triage Notes (Signed)
Pt. BIB [facility] for respiratory distress. From [facility]. 84% RA. Pt. Is a hospice patient. Pt. Breathing about 40 times a min with EMS.

## 2023-08-31 NOTE — ED Provider Notes (Signed)
[place] EMERGENCY DEPARTMENT AT [facility] Provider Note   CSN: [id number] Arrival date & time: 08/31/23  2042     History {Add pertinent medical, surgical, social history, OB history to HPI:1} Chief Complaint  Patient presents with   Respiratory Distress    [name] is a 78 y.o. male.  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 Authorizing Provider  acetaminophen (TYLENOL) 325 MG tablet Take 650 mg by mouth every morning.   Yes [provider]  aspirin EC 81 MG tablet Take 81 mg by mouth daily.   Yes [provider]  alum & mag hydroxide-simeth (MAALOX/MYLANTA) 200-200-20 MG/5ML suspension Take 30 mLs by mouth daily as needed for indigestion or heartburn.    [provider]  cholecalciferol (VITAMIN D3) 10 MCG (400 UNIT) TABS tablet Take 2 tablets (800 Units total) by mouth daily. 12/11/21   Andrey Campanile, MD  DULoxetine (CYMBALTA) 30 MG capsule Take 30 mg by mouth daily.    [provider]  Emollient (AQUAPHOR ADVANCED THERAPY) OINT Apply 1 application. topically daily. Spread topically to bilateral lower extremities.    [provider]  feeding supplement (ENSURE ENLIVE / ENSURE PLUS) LIQD Take 237 mLs by mouth 2 (two) times daily between meals. 12/11/21   Andrey Campanile, MD  folic acid (FOLVITE) 1 MG tablet Take 1 tablet (1 mg total) by mouth daily. 12/11/21   Andrey Campanile, MD  gabapentin (NEURONTIN) 300 MG capsule Take 300 mg by mouth 2 (two) times  daily.    [provider]  guaiFENesin (ROBITUSSIN) 100 MG/5ML liquid Take 10 mLs by mouth every 6 (six) hours as needed for cough or to loosen phlegm.    [provider]  latanoprost (XALATAN) 0.005 % ophthalmic solution Place 1 drop into both eyes at bedtime.    [provider]  levETIRAcetam (KEPPRA) 750 MG tablet Take 750 mg by mouth 2 (two) times daily.    [provider]  lisinopril (ZESTRIL) 10 MG tablet Take 10 mg by mouth daily.    [provider]  loperamide (IMODIUM) 2 MG capsule Take 2 mg by mouth daily as needed for diarrhea or loose stools. Not to exceed 8 doses in 24 hours    [provider]  magnesium hydroxide (MILK OF MAGNESIA) 400 MG/5ML suspension Take 30 mLs by mouth at bedtime as needed for mild constipation.    [provider]  Menthol, Topical Analgesic, 4 % GEL Apply 1 application. topically in the morning and at bedtime. Apply to both shoulders    [provider]  Multiple Vitamin (MULTIVITAMIN WITH MINERALS) TABS tablet Take 1 tablet by mouth daily. 12/11/21   Andrey Campanile, MD  Neomycin-Bacitracin-Polymyxin (TRIPLE ANTIBIOTIC) [phone] OINT Apply 1 application. topically daily as needed (minor skin tears or abrasions).    [provider]  ondansetron (ZOFRAN) 4 MG tablet Take 4 mg by  mouth every 6 (six) hours as needed for nausea or vomiting.    [provider]  phenytoin (DILANTIN) 100 MG ER capsule Take 1 capsule (100 mg total) by mouth daily. 12/11/21   Andrey Campanile, MD  thiamine 100 MG tablet Take 1 tablet (100 mg total) by mouth daily. 12/11/21   Andrey Campanile, MD  traZODone (DESYREL) 100 MG tablet Take 100 mg by mouth at bedtime.    [provider]  Zinc Oxide 12 % CREA Apply 1 application. topically See admin instructions. Every shift for dermatitis    [provider]  zonisamide (ZONEGRAN) 50 MG capsule Take 3 capsules (150 mg total) by mouth  daily. Patient taking differently: Take 100 mg by mouth in the morning and at bedtime. 10/23/16   Joseph Art, DO      Allergies    Carbamazepine and Tricyclic antidepressants    Review of Systems   Review of Systems  Physical Exam Updated Vital Signs BP 99/65   Pulse (!) 105   Resp 20   SpO2 100%  Physical Exam  ED Results / Procedures / Treatments   Labs (all labs ordered are listed, but only abnormal results are displayed) Labs Reviewed  RESP PANEL BY RT-PCR (RSV, FLU A&B, COVID)  RVPGX2    EKG None  Radiology DG Chest 2 View Result Date: 08/31/2023 CLINICAL DATA:  Shortness of breath EXAM: CHEST - 2 VIEW COMPARISON:  10/07/2022 FINDINGS: Check shadow is stable. Aortic calcifications are noted. The lungs are well aerated bilaterally. No focal infiltrate or effusion is seen. Old rib fractures are seen. IMPRESSION: No acute abnormality noted. Electronically Signed   By: Alcide Clever [name]   On: 08/31/2023 21:40    Procedures Procedures  {Document cardiac monitor, telemetry assessment procedure when appropriate:1}  Medications Ordered in ED Medications  albuterol (VENTOLIN HFA) 108 (90 Base) MCG/ACT inhaler 2 puff (has no administration in time range)    ED Course/ Medical Decision Making/ A&P   {   Click here for ABCD2, HEART and other calculatorsREFRESH Note before signing :1}                              Medical Decision Making  ***    [address] [place] [phone]   {Document critical care time when appropriate:1} {Document review of labs and clinical decision tools ie heart score, Chads2Vasc2 etc:1}  {Document your independent review of radiology images, and any outside records:1} {Document your discussion with family members, caretakers, and with consultants:1} {Document social determinants of health affecting pt's care:1} {Document your decision making why or why not admission, treatments were needed:1} Final Clinical Impression(s)  / ED Diagnoses Final diagnoses:  None    Rx / DC Orders ED Discharge Orders     None

## 2023-09-01 NOTE — ED Notes (Signed)
Pt left with [facility]. DNR and all other paperwork necessary given to [facility].

## 2023-09-07 DEATH — deceased
